# Patient Record
Sex: Female | Born: 1970 | Race: White | Hispanic: No | Smoking: Current every day smoker
Health system: Southern US, Community
[De-identification: ages and names within clinical notes are randomized; demographics above are authoritative.]

## PROBLEM LIST (undated history)

## (undated) DIAGNOSIS — J449 Chronic obstructive pulmonary disease, unspecified: Secondary | ICD-10-CM

## (undated) DIAGNOSIS — F1020 Alcohol dependence, uncomplicated: Secondary | ICD-10-CM

## (undated) DIAGNOSIS — O00109 Unspecified tubal pregnancy without intrauterine pregnancy: Secondary | ICD-10-CM

## (undated) DIAGNOSIS — E559 Vitamin D deficiency, unspecified: Secondary | ICD-10-CM

## (undated) DIAGNOSIS — K7010 Alcoholic hepatitis without ascites: Secondary | ICD-10-CM

## (undated) DIAGNOSIS — Z720 Tobacco use: Secondary | ICD-10-CM

## (undated) DIAGNOSIS — K449 Diaphragmatic hernia without obstruction or gangrene: Secondary | ICD-10-CM

## (undated) DIAGNOSIS — G8929 Other chronic pain: Secondary | ICD-10-CM

## (undated) DIAGNOSIS — K760 Fatty (change of) liver, not elsewhere classified: Secondary | ICD-10-CM

## (undated) DIAGNOSIS — K219 Gastro-esophageal reflux disease without esophagitis: Secondary | ICD-10-CM

## (undated) DIAGNOSIS — J439 Emphysema, unspecified: Secondary | ICD-10-CM

## (undated) DIAGNOSIS — F1010 Alcohol abuse, uncomplicated: Secondary | ICD-10-CM

## (undated) DIAGNOSIS — J45909 Unspecified asthma, uncomplicated: Secondary | ICD-10-CM

## (undated) DIAGNOSIS — R748 Abnormal levels of other serum enzymes: Secondary | ICD-10-CM

## (undated) DIAGNOSIS — D7589 Other specified diseases of blood and blood-forming organs: Secondary | ICD-10-CM

## (undated) DIAGNOSIS — F4323 Adjustment disorder with mixed anxiety and depressed mood: Secondary | ICD-10-CM

## (undated) DIAGNOSIS — F1021 Alcohol dependence, in remission: Secondary | ICD-10-CM

## (undated) DIAGNOSIS — M549 Dorsalgia, unspecified: Secondary | ICD-10-CM

## (undated) DIAGNOSIS — E279 Disorder of adrenal gland, unspecified: Secondary | ICD-10-CM

## (undated) DIAGNOSIS — F1910 Other psychoactive substance abuse, uncomplicated: Secondary | ICD-10-CM

## (undated) DIAGNOSIS — R011 Cardiac murmur, unspecified: Secondary | ICD-10-CM

## (undated) DIAGNOSIS — D649 Anemia, unspecified: Secondary | ICD-10-CM

## (undated) DIAGNOSIS — G621 Alcoholic polyneuropathy: Secondary | ICD-10-CM

## (undated) DIAGNOSIS — I10 Essential (primary) hypertension: Secondary | ICD-10-CM

## (undated) DIAGNOSIS — M1990 Unspecified osteoarthritis, unspecified site: Secondary | ICD-10-CM

## (undated) DIAGNOSIS — M4804 Spinal stenosis, thoracic region: Secondary | ICD-10-CM

## (undated) DIAGNOSIS — M5124 Other intervertebral disc displacement, thoracic region: Secondary | ICD-10-CM

## (undated) HISTORY — DX: Anemia, unspecified: D64.9

## (undated) HISTORY — DX: Alcohol abuse, uncomplicated: F10.10

## (undated) HISTORY — DX: Other psychoactive substance abuse, uncomplicated: F19.10

## (undated) HISTORY — DX: Alcohol dependence, in remission: F10.21

## (undated) HISTORY — DX: Essential (primary) hypertension: I10

## (undated) HISTORY — DX: Vitamin D deficiency, unspecified: E55.9

## (undated) HISTORY — DX: Cardiac murmur, unspecified: R01.1

## (undated) HISTORY — DX: Alcoholic polyneuropathy: G62.1

## (undated) HISTORY — DX: Diaphragmatic hernia without obstruction or gangrene: K44.9

## (undated) HISTORY — DX: Gastro-esophageal reflux disease without esophagitis: K21.9

## (undated) HISTORY — PX: LAPAROSCOPIC OOPHERECTOMY: SHX6507

## (undated) HISTORY — PX: FINGER SURGERY: SHX640

## (undated) HISTORY — DX: Alcohol dependence, uncomplicated: F10.20

## (undated) HISTORY — DX: Emphysema, unspecified: J43.9

## (undated) HISTORY — DX: Other specified diseases of blood and blood-forming organs: D75.89

## (undated) HISTORY — DX: Chronic obstructive pulmonary disease, unspecified: J44.9

## (undated) HISTORY — DX: Abnormal levels of other serum enzymes: R74.8

## (undated) HISTORY — DX: Other intervertebral disc displacement, thoracic region: M51.24

## (undated) HISTORY — DX: Spinal stenosis, thoracic region: M48.04

## (undated) HISTORY — DX: Alcoholic hepatitis without ascites: K70.10

## (undated) HISTORY — DX: Unspecified tubal pregnancy without intrauterine pregnancy: O00.109

## (undated) HISTORY — DX: Tobacco use: Z72.0

## (undated) HISTORY — DX: Unspecified asthma, uncomplicated: J45.909

## (undated) HISTORY — DX: Adjustment disorder with mixed anxiety and depressed mood: F43.23

## (undated) HISTORY — DX: Disorder of adrenal gland, unspecified: E27.9

---

## 1990-04-30 HISTORY — PX: TUBAL LIGATION: SHX77

## 2004-06-13 ENCOUNTER — Ambulatory Visit: Payer: Self-pay | Admitting: General Practice

## 2010-05-31 ENCOUNTER — Ambulatory Visit: Payer: Self-pay | Admitting: Oncology

## 2010-06-19 ENCOUNTER — Inpatient Hospital Stay: Payer: Self-pay | Admitting: Internal Medicine

## 2010-06-26 ENCOUNTER — Ambulatory Visit: Payer: Self-pay | Admitting: Oncology

## 2010-06-29 ENCOUNTER — Ambulatory Visit: Payer: Self-pay | Admitting: Oncology

## 2010-07-30 ENCOUNTER — Ambulatory Visit: Payer: Self-pay | Admitting: Oncology

## 2010-09-14 ENCOUNTER — Ambulatory Visit: Payer: Self-pay

## 2010-09-26 ENCOUNTER — Ambulatory Visit: Payer: Self-pay

## 2010-10-30 ENCOUNTER — Ambulatory Visit: Payer: Self-pay | Admitting: Oncology

## 2010-11-29 ENCOUNTER — Ambulatory Visit: Payer: Self-pay | Admitting: Oncology

## 2010-12-30 ENCOUNTER — Ambulatory Visit: Payer: Self-pay | Admitting: Oncology

## 2011-01-29 ENCOUNTER — Ambulatory Visit: Payer: Self-pay | Admitting: Oncology

## 2011-03-19 ENCOUNTER — Ambulatory Visit: Payer: Self-pay

## 2011-04-03 ENCOUNTER — Ambulatory Visit: Payer: Self-pay | Admitting: Oncology

## 2011-05-01 ENCOUNTER — Ambulatory Visit: Payer: Self-pay | Admitting: Oncology

## 2011-06-01 ENCOUNTER — Ambulatory Visit: Payer: Self-pay | Admitting: Oncology

## 2011-06-26 LAB — CBC CANCER CENTER
Basophil: 1 %
Eosinophil: 1 %
HCT: 42 % (ref 35.0–47.0)
HGB: 14.3 g/dL (ref 12.0–16.0)
Lymphocytes: 32 %
MCHC: 34.1 g/dL (ref 32.0–36.0)
MCV: 111 fL — ABNORMAL HIGH (ref 80–100)
Monocytes: 3 %
Segmented Neutrophils: 63 %
WBC: 8.5 x10 3/mm (ref 3.6–11.0)

## 2011-06-29 ENCOUNTER — Ambulatory Visit: Payer: Self-pay | Admitting: Oncology

## 2011-07-26 ENCOUNTER — Other Ambulatory Visit: Payer: Self-pay | Admitting: Diagnostic Neuroimaging

## 2011-07-26 DIAGNOSIS — R9089 Other abnormal findings on diagnostic imaging of central nervous system: Secondary | ICD-10-CM

## 2011-07-26 DIAGNOSIS — H539 Unspecified visual disturbance: Secondary | ICD-10-CM

## 2011-07-30 ENCOUNTER — Ambulatory Visit: Payer: Self-pay | Admitting: Oncology

## 2011-07-31 ENCOUNTER — Other Ambulatory Visit: Payer: Self-pay

## 2011-08-01 ENCOUNTER — Other Ambulatory Visit: Payer: Self-pay

## 2011-08-01 ENCOUNTER — Inpatient Hospital Stay: Admission: RE | Admit: 2011-08-01 | Payer: Self-pay | Source: Ambulatory Visit

## 2011-08-01 NOTE — Discharge Instructions (Signed)

## 2011-08-08 ENCOUNTER — Ambulatory Visit
Admission: RE | Admit: 2011-08-08 | Discharge: 2011-08-08 | Disposition: A | Discharge status: Home / Self Care | Payer: PRIVATE HEALTH INSURANCE | Source: Ambulatory Visit | Attending: Diagnostic Neuroimaging | Admitting: Diagnostic Neuroimaging

## 2011-08-08 ENCOUNTER — Other Ambulatory Visit (HOSPITAL_COMMUNITY)
Admission: RE | Admit: 2011-08-08 | Discharge: 2011-08-08 | Disposition: A | Discharge status: Home / Self Care | Payer: PRIVATE HEALTH INSURANCE | Source: Ambulatory Visit | Attending: Diagnostic Radiology | Admitting: Diagnostic Radiology

## 2011-08-08 VITALS — BP 129/78 | HR 66

## 2011-08-08 DIAGNOSIS — H5310 Unspecified subjective visual disturbances: Secondary | ICD-10-CM | POA: Insufficient documentation

## 2011-08-08 DIAGNOSIS — R9089 Other abnormal findings on diagnostic imaging of central nervous system: Secondary | ICD-10-CM

## 2011-08-08 DIAGNOSIS — R9409 Abnormal results of other function studies of central nervous system: Secondary | ICD-10-CM | POA: Insufficient documentation

## 2011-08-08 DIAGNOSIS — R839 Unspecified abnormal finding in cerebrospinal fluid: Secondary | ICD-10-CM

## 2011-08-08 DIAGNOSIS — R51 Headache: Secondary | ICD-10-CM | POA: Insufficient documentation

## 2011-08-08 DIAGNOSIS — H539 Unspecified visual disturbance: Secondary | ICD-10-CM

## 2011-08-08 NOTE — Progress Notes (Signed)
Resting quietly without complaint.  "I'm doing fine."  jkl

## 2011-08-08 NOTE — Progress Notes (Signed)
One purple top and four tiger top tubes blood collected from right [facility] space for labs.  jkl

## 2011-08-08 NOTE — Discharge Instructions (Signed)
Lumbar Puncture Discharge Instructions  1. Go home and rest quietly for the next 24 hours.  It is important to lie flat for the next 24 hours.  Get up only to go to the restroom.  You may lie in the bed or on a couch on your back, your stomach, your left side or your right side.  You may have one pillow under your head.  You may have pillows between your knees while you are on your side or under your knees while you are on your back.  2. DO NOT drive today.  Recline the seat as far back as it will go, while still wearing your seat belt, on the way home.  3. You may get up to go to the bathroom as needed.  You may sit up for 10 minutes to eat.  You may resume your normal diet and medications unless otherwise indicated.  Drink lots of extra fluids today and tomorrow.  4. The incidence of headache, nausea, or vomiting is about 5% (one in 20 patients).  If you develop a headache, lie flat and drink plenty of fluids until the headache goes away.  Caffeinated beverages may be helpful.  If you develop severe nausea and vomiting or a headache that does not go away with flat bed rest, call [phone].  5. You may resume normal activities after your 24 hours of bed rest is over; however, do not exert yourself strongly or do any heavy lifting tomorrow.  6. Call your physician for a follow-up appointment.   7. If you have any questions or if complications develop after you arrive home, please call [phone].  Discharge instructions have been explained to the patient.  The patient, or the person responsible for the patient, fully understands these instructions. y

## 2011-08-08 NOTE — Progress Notes (Signed)
Pt had blood drawn for labs and to go with spinal fluid. Dd unsuccessful X1 left antecube. J.lohr rn was able to draw blood from right antecube, site unremarkable. Pt tolerated procedure well.

## 2011-08-29 ENCOUNTER — Ambulatory Visit: Payer: Self-pay | Admitting: Oncology

## 2011-09-04 ENCOUNTER — Ambulatory Visit: Payer: Self-pay

## 2011-09-18 ENCOUNTER — Ambulatory Visit: Payer: Self-pay | Admitting: Oncology

## 2011-09-18 LAB — CBC CANCER CENTER
Basophil #: 0.2 x10 3/mm — ABNORMAL HIGH (ref 0.0–0.1)
Eosinophil #: 0.1 x10 3/mm (ref 0.0–0.7)
Eosinophil %: 1.7 %
HGB: 13.4 g/dL (ref 12.0–16.0)
Lymphocyte #: 1.9 x10 3/mm (ref 1.0–3.6)
Lymphocyte %: 25.4 %
MCH: 38.2 pg — ABNORMAL HIGH (ref 26.0–34.0)
MCHC: 34.1 g/dL (ref 32.0–36.0)
MCV: 112 fL — ABNORMAL HIGH (ref 80–100)
Monocyte #: 0.6 x10 3/mm (ref 0.2–0.9)
Neutrophil #: 4.7 x10 3/mm (ref 1.4–6.5)
Neutrophil %: 62.8 %
RBC: 3.5 10*6/uL — ABNORMAL LOW (ref 3.80–5.20)
RDW: 15.4 % — ABNORMAL HIGH (ref 11.5–14.5)

## 2011-09-29 ENCOUNTER — Ambulatory Visit: Payer: Self-pay | Admitting: Oncology

## 2012-01-16 ENCOUNTER — Ambulatory Visit: Payer: Self-pay

## 2012-01-23 LAB — BASIC METABOLIC PANEL
Calcium, Total: 7.6 mg/dL — ABNORMAL LOW (ref 8.5–10.1)
Co2: 23 mmol/L (ref 21–32)
EGFR (African American): >60
EGFR (Non-African Amer.): >60
Glucose: 99 mg/dL (ref 65–99)
Osmolality: 261 (ref 275–301)
Potassium: 2 mmol/L — CL (ref 3.5–5.1)
Sodium: 131 mmol/L — ABNORMAL LOW (ref 136–145)

## 2012-01-23 LAB — HEPATIC FUNCTION PANEL A (ARMC)
Alkaline Phosphatase: 224 U/L — ABNORMAL HIGH (ref 50–136)
Bilirubin, Direct: 0.1 mg/dL (ref 0.00–0.20)

## 2012-01-23 LAB — CBC
HCT: 19.8 % — ABNORMAL LOW (ref 35.0–47.0)
HGB: 7 g/dL — ABNORMAL LOW (ref 12.0–16.0)
MCH: 39.8 pg — ABNORMAL HIGH (ref 26.0–34.0)
RBC: 1.76 10*6/uL — ABNORMAL LOW (ref 3.80–5.20)
WBC: 12.2 10*3/uL — ABNORMAL HIGH (ref 3.6–11.0)

## 2012-01-23 LAB — MAGNESIUM: Magnesium: 1.2 mg/dL — ABNORMAL LOW

## 2012-01-23 LAB — PHOSPHORUS: Phosphorus: 2.4 mg/dL — ABNORMAL LOW (ref 2.5–4.9)

## 2012-01-24 LAB — CBC WITH DIFFERENTIAL/PLATELET
Basophil #: 0.1 10*3/uL (ref 0.0–0.1)
Basophil %: 0.9 %
Eosinophil #: 0.1 10*3/uL (ref 0.0–0.7)
HCT: 21.5 % — ABNORMAL LOW (ref 35.0–47.0)
Lymphocyte #: 1.1 10*3/uL (ref 1.0–3.6)
MCHC: 35.2 g/dL (ref 32.0–36.0)
MCV: 102 fL — ABNORMAL HIGH (ref 80–100)
Monocyte %: 2 %
Neutrophil #: 5.2 10*3/uL (ref 1.4–6.5)
RDW: 26.9 % — ABNORMAL HIGH (ref 11.5–14.5)
WBC: 6.7 10*3/uL (ref 3.6–11.0)

## 2012-01-24 LAB — BASIC METABOLIC PANEL
Anion Gap: 10 (ref 7–16)
Calcium, Total: 7.2 mg/dL — ABNORMAL LOW (ref 8.5–10.1)
Co2: 30 mmol/L (ref 21–32)
Creatinine: 0.79 mg/dL (ref 0.60–1.30)
EGFR (African American): >60
Osmolality: 272 (ref 275–301)
Potassium: 3.2 mmol/L — ABNORMAL LOW (ref 3.5–5.1)

## 2012-01-24 LAB — MAGNESIUM: Magnesium: 2 mg/dL

## 2012-01-25 ENCOUNTER — Inpatient Hospital Stay: Payer: Self-pay | Admitting: Internal Medicine

## 2012-01-25 LAB — CBC WITH DIFFERENTIAL/PLATELET
Basophil #: 0 10*3/uL (ref 0.0–0.1)
Eosinophil #: 0.1 10*3/uL (ref 0.0–0.7)
Eosinophil %: 2.2 %
Lymphocyte #: 1.8 10*3/uL (ref 1.0–3.6)
MCH: 35.6 pg — ABNORMAL HIGH (ref 26.0–34.0)
MCHC: 34.5 g/dL (ref 32.0–36.0)
MCV: 103 fL — ABNORMAL HIGH (ref 80–100)
Monocyte #: 0.1 x10 3/mm — ABNORMAL LOW (ref 0.2–0.9)
Neutrophil %: 63.2 %
Platelet: 207 10*3/uL (ref 150–440)
RBC: 1.88 10*6/uL — ABNORMAL LOW (ref 3.80–5.20)
RDW: 28.2 % — ABNORMAL HIGH (ref 11.5–14.5)

## 2012-01-25 LAB — BASIC METABOLIC PANEL
Anion Gap: 9 (ref 7–16)
BUN: 5 mg/dL — ABNORMAL LOW (ref 7–18)
Creatinine: 0.76 mg/dL (ref 0.60–1.30)
EGFR (African American): >60
EGFR (Non-African Amer.): >60
Glucose: 95 mg/dL (ref 65–99)
Osmolality: 282 (ref 275–301)
Potassium: 3.2 mmol/L — ABNORMAL LOW (ref 3.5–5.1)
Sodium: 143 mmol/L (ref 136–145)

## 2012-01-25 LAB — FOLATE: Folic Acid: 2.4 ng/mL — ABNORMAL LOW (ref 3.1–100.0)

## 2012-01-25 LAB — IRON AND TIBC
Iron Bind.Cap.(Total): 210 ug/dL — ABNORMAL LOW (ref 250–450)
Iron: 183 ug/dL — ABNORMAL HIGH (ref 50–170)
Unbound Iron-Bind.Cap.: 27 ug/dL

## 2012-01-25 LAB — OCCULT BLOOD X 1 CARD TO LAB, STOOL: Occult Blood, Feces: NEGATIVE

## 2012-01-26 LAB — BASIC METABOLIC PANEL
Anion Gap: 7 (ref 7–16)
BUN: 6 mg/dL — ABNORMAL LOW (ref 7–18)
Calcium, Total: 7.7 mg/dL — ABNORMAL LOW (ref 8.5–10.1)
Co2: 26 mmol/L (ref 21–32)
EGFR (African American): >60
EGFR (Non-African Amer.): >60
Glucose: 82 mg/dL (ref 65–99)
Osmolality: 276 (ref 275–301)

## 2012-01-26 LAB — MAGNESIUM: Magnesium: 1.5 mg/dL — ABNORMAL LOW

## 2012-01-26 LAB — HEMOGLOBIN: HGB: 8.1 g/dL — ABNORMAL LOW (ref 12.0–16.0)

## 2012-01-26 LAB — HEMATOCRIT: HCT: 23.9 % — ABNORMAL LOW (ref 35.0–47.0)

## 2012-02-05 ENCOUNTER — Ambulatory Visit: Payer: Self-pay | Admitting: Oncology

## 2012-02-05 LAB — COMPREHENSIVE METABOLIC PANEL
Alkaline Phosphatase: 239 U/L — ABNORMAL HIGH (ref 50–136)
BUN: 10 mg/dL (ref 7–18)
Bilirubin,Total: 0.2 mg/dL (ref 0.2–1.0)
Creatinine: 1.05 mg/dL (ref 0.60–1.30)
EGFR (Non-African Amer.): >60
SGPT (ALT): 43 U/L (ref 12–78)
Total Protein: 6.5 g/dL (ref 6.4–8.2)

## 2012-02-05 LAB — MAGNESIUM: Magnesium: 1.7 mg/dL — ABNORMAL LOW

## 2012-02-05 LAB — CBC CANCER CENTER
Basophil #: 0.1 x10 3/mm (ref 0.0–0.1)
Eosinophil %: 2.5 %
Lymphocyte %: 22.7 %
Monocyte #: 0.9 x10 3/mm (ref 0.2–0.9)
Monocyte %: 6.8 %
Neutrophil %: 67.2 %
Platelet: 294 x10 3/mm (ref 150–440)
RBC: 3.24 10*6/uL — ABNORMAL LOW (ref 3.80–5.20)
WBC: 13.3 x10 3/mm — ABNORMAL HIGH (ref 3.6–11.0)

## 2012-02-29 ENCOUNTER — Ambulatory Visit: Payer: Self-pay | Admitting: Oncology

## 2012-04-30 DIAGNOSIS — R748 Abnormal levels of other serum enzymes: Secondary | ICD-10-CM

## 2012-04-30 HISTORY — DX: Abnormal levels of other serum enzymes: R74.8

## 2012-05-08 ENCOUNTER — Ambulatory Visit: Payer: Self-pay | Admitting: Oncology

## 2012-05-08 LAB — IRON AND TIBC
Iron Bind.Cap.(Total): 241 ug/dL — ABNORMAL LOW (ref 250–450)
Iron Saturation: 95 %
Iron: 228 ug/dL — ABNORMAL HIGH (ref 50–170)
Unbound Iron-Bind.Cap.: 13 ug/dL

## 2012-05-08 LAB — CBC CANCER CENTER
Basophil %: 0.7 %
Eosinophil #: 0.2 x10 3/mm (ref 0.0–0.7)
Eosinophil %: 1.5 %
MCH: 34.7 pg — ABNORMAL HIGH (ref 26.0–34.0)
MCHC: 34.6 g/dL (ref 32.0–36.0)
Monocyte #: 0.6 x10 3/mm (ref 0.2–0.9)
Monocyte %: 5.9 %
Neutrophil #: 7.1 x10 3/mm — ABNORMAL HIGH (ref 1.4–6.5)
Neutrophil %: 64.4 %
RBC: 4.43 10*6/uL (ref 3.80–5.20)
RDW: 14.2 % (ref 11.5–14.5)
WBC: 11 x10 3/mm (ref 3.6–11.0)

## 2012-05-08 LAB — FOLATE: Folic Acid: >100 ng/mL — ABNORMAL HIGH (ref 3.1–100.0)

## 2012-05-08 LAB — FERRITIN: Ferritin (ARMC): 527 ng/mL — ABNORMAL HIGH (ref 8–388)

## 2012-05-31 ENCOUNTER — Ambulatory Visit: Payer: Self-pay | Admitting: Oncology

## 2012-08-04 DIAGNOSIS — Z0289 Encounter for other administrative examinations: Secondary | ICD-10-CM

## 2012-09-04 ENCOUNTER — Ambulatory Visit: Payer: Self-pay | Admitting: Oncology

## 2012-09-05 LAB — CBC CANCER CENTER
Eosinophil %: 1.3 %
HGB: 14.9 g/dL (ref 12.0–16.0)
Lymphocyte #: 2.4 x10 3/mm (ref 1.0–3.6)
Lymphocyte %: 25.8 %
MCV: 98 fL (ref 80–100)
Monocyte %: 5.4 %
Neutrophil #: 6.3 x10 3/mm (ref 1.4–6.5)
Neutrophil %: 66.9 %
Platelet: 194 x10 3/mm (ref 150–440)
WBC: 9.3 x10 3/mm (ref 3.6–11.0)

## 2012-09-05 LAB — FOLATE: Folic Acid: 78.5 ng/mL (ref 3.1–100.0)

## 2012-09-05 LAB — IRON AND TIBC
Iron Saturation: 50 %
Iron: 171 ug/dL — ABNORMAL HIGH (ref 50–170)
Unbound Iron-Bind.Cap.: 169 ug/dL

## 2012-09-28 ENCOUNTER — Ambulatory Visit: Payer: Self-pay | Admitting: Oncology

## 2013-01-22 ENCOUNTER — Ambulatory Visit: Payer: Self-pay | Admitting: Oncology

## 2013-01-22 LAB — CBC CANCER CENTER
Basophil #: 0.1 x10 3/mm (ref 0.0–0.1)
Eosinophil #: 0.1 x10 3/mm (ref 0.0–0.7)
HCT: 44.7 % (ref 35.0–47.0)
HGB: 15.3 g/dL (ref 12.0–16.0)
Lymphocyte #: 2.2 x10 3/mm (ref 1.0–3.6)
Lymphocyte %: 18.8 %
MCH: 35.6 pg — ABNORMAL HIGH (ref 26.0–34.0)
MCHC: 34.3 g/dL (ref 32.0–36.0)
Monocyte %: 6.3 %
Neutrophil %: 73.4 %
Platelet: 212 x10 3/mm (ref 150–440)
RDW: 15.2 % — ABNORMAL HIGH (ref 11.5–14.5)

## 2013-01-22 LAB — IRON AND TIBC: Iron: 250 ug/dL — ABNORMAL HIGH (ref 50–170)

## 2013-01-28 ENCOUNTER — Ambulatory Visit: Payer: Self-pay | Admitting: Oncology

## 2013-04-01 ENCOUNTER — Ambulatory Visit: Payer: Self-pay | Admitting: Family Medicine

## 2013-04-08 ENCOUNTER — Encounter: Payer: Self-pay | Admitting: Diagnostic Neuroimaging

## 2013-04-08 ENCOUNTER — Ambulatory Visit (INDEPENDENT_AMBULATORY_CARE_PROVIDER_SITE_OTHER): Payer: BC Managed Care – PPO | Admitting: Diagnostic Neuroimaging

## 2013-04-08 ENCOUNTER — Encounter (INDEPENDENT_AMBULATORY_CARE_PROVIDER_SITE_OTHER): Payer: Self-pay

## 2013-04-08 VITALS — BP 139/92 | HR 83 | Temp 98.3°F | Ht 65.5 in | Wt 217.5 lb

## 2013-04-08 DIAGNOSIS — F17200 Nicotine dependence, unspecified, uncomplicated: Secondary | ICD-10-CM

## 2013-04-08 DIAGNOSIS — Z720 Tobacco use: Secondary | ICD-10-CM | POA: Insufficient documentation

## 2013-04-08 DIAGNOSIS — M531 Cervicobrachial syndrome: Secondary | ICD-10-CM

## 2013-04-08 DIAGNOSIS — I6789 Other cerebrovascular disease: Secondary | ICD-10-CM

## 2013-04-08 DIAGNOSIS — G621 Alcoholic polyneuropathy: Secondary | ICD-10-CM

## 2013-04-08 DIAGNOSIS — R269 Unspecified abnormalities of gait and mobility: Secondary | ICD-10-CM

## 2013-04-08 DIAGNOSIS — M5481 Occipital neuralgia: Secondary | ICD-10-CM

## 2013-04-08 DIAGNOSIS — I6782 Cerebral ischemia: Secondary | ICD-10-CM

## 2013-04-08 DIAGNOSIS — F1010 Alcohol abuse, uncomplicated: Secondary | ICD-10-CM

## 2013-04-08 DIAGNOSIS — M5412 Radiculopathy, cervical region: Secondary | ICD-10-CM | POA: Insufficient documentation

## 2013-04-08 DIAGNOSIS — F1011 Alcohol abuse, in remission: Secondary | ICD-10-CM | POA: Insufficient documentation

## 2013-04-08 MED ORDER — AMITRIPTYLINE HCL 25 MG PO TABS: 25.0000 mg | ORAL_TABLET | Freq: Every day | ORAL | Status: DC

## 2013-04-08 NOTE — Progress Notes (Signed)
[facility]  PATIENT: Beth [name] DOB: Oct 24, 1970  REFERRING CLINICIAN: Lada HISTORY FROM: patient, sister, brother REASON FOR VISIT: follow up / new problem   HISTORICAL  CHIEF COMPLAINT:  Chief Complaint  Patient presents with  . Headache    HISTORY OF PRESENT ILLNESS:   UPDATE 04/08/13: Since last visit patient has developed new-onset sharp, lightning/electrical sensations in the back of her head down her spine. This happens randomly without triggering factor. Episode lasted for one second at a time. She has had 7 episodes in the last 2 weeks. Patient MRI of the brain which showed slight progression of nonspecific white matter lesions, likely chronic small vessel ischemic disease. Patient continues on gabapentin 2400 mg daily for neuropathy symptoms and feet. Patient continues to smoke one pack cigarettes per day. She's cut down to drinking alcohol 3-4 times per week. Her mobility is still poor and she uses a rolling walker. She's not very active and her weight has gradually increased over the past year (155lbs on 08/31/11 --> 171lbs on 06/03/12 --> 217lbs 04/08/13).  UPDATE 06/03/12: Since last visit, her husband passed away due to alcholoic cirrhosis. She was admitted to the hospital the same day he was buried, for hypokalemia and hypomagnesemia. She had been binging on ETOH and not eating. Now has cut down, but not quit ETOH. Still with intermittent pain in her feet. Still with anemia.  UPDATE 12/17/11:  She is doing worse with her symptoms.  She is having more frequent falls.  She is having difficulty with writing. She is having numbness from her upper lip down to her feet, this has worsened in the last 3 weeks.  Denies any headaches.  She complains of bilateral leg pain like "pins and needles".  She stopped drinking alcohol 4 weeks ago; reports drinking 8-9 glasses of Whiskey daily.  Reports her thyroid and diabetes labs were normal; I do not have these labs today.   She denies back pain.  She is here with her sister   UPDATE 08/31/11: Doing about the same. Now with numbness and tingling in the hand and feet.  Headaches better.  Tests reviewed.  UPDATE 07/25/11: Doing the same. Brought MRI disc and report for me to review today. Vision, numbness are same. Headaches are better.  PRIOR HPI (04/04/11): 42 year old right-handed female with history of hypertension, unexplained anemia, here for evaluation of progressive vision changes, headaches and abnormal MRI brain.  Symptoms started in October 2012. She developed gradual blurred vision in both eyes, optometrist who could not correct the problem with lenses, and then she had MRI which showed nonspecific white matter changes.   In last 1 week, she has developed dull HA.   In August 2012, she was hospitalized due to malaise and found to have severe anemia requiring blood transfusion. Extensive testing including bone marrow biopsy, could not identify the cause of her anemia.  REVIEW OF SYSTEMS: Full 14 system review of systems performed and notable only for fatigue anemia murmurs blurred vision shortness of breath increased there is peripancreatic muscles sleep decreased energy dizziness restless legs insomnia headache numbness weakness.  ALLERGIES: Allergies  Allergen Reactions  . Erythromycin Itching and Rash    HOME MEDICATIONS: No outpatient prescriptions prior to visit.   No facility-administered medications prior to visit.    PAST MEDICAL HISTORY: History reviewed. No pertinent past medical history.  PAST SURGICAL HISTORY: Past Surgical History  Procedure Laterality Date  . Tubal ligation  1992  . Finger surgery  FAMILY HISTORY: Family History  Problem Relation Age of Onset  . Cancer Mother   . Kidney failure Father   . Diabetes Father     SOCIAL HISTORY:  History   Social History  . Marital Status: Widowed    Spouse Name: N/A    Number of Children: 0  . Years of Education:  12th   Occupational History  .  Other    n/a   Social History Main Topics  . Smoking status: Never Smoker   . Smokeless tobacco: Never Used  . Alcohol Use: Yes     Comment: 3-4 drinks weekly  . Drug Use: No  . Sexual Activity: Not on file   Other Topics Concern  . Not on file   Social History Narrative   Patient lives at home with her family.   Caffeine Use: daily     PHYSICAL EXAM  Filed Vitals:   04/08/13 1032  BP: 139/92  Pulse: 83  Temp: 98.3 F (36.8 C)  TempSrc: Oral  Height: 5' 5.5" (1.664 m)  Weight: 217 lb 8 oz (98.657 kg)    Not recorded    Body mass index is 35.63 kg/(m^2).  GENERAL EXAM: Patient is in no distress; well developed, nourished and groomed; neck is supple  CARDIOVASCULAR: Regular rate and rhythm, no murmurs, no carotid bruits  NEUROLOGIC: MENTAL STATUS: awake, alert, oriented to person, place and time, recent and remote memory intact, normal attention and concentration, language fluent, comprehension intact, naming intact, fund of knowledge appropriate CRANIAL NERVE: no papilledema on fundoscopic exam, pupils equal and reactive to light, visual fields full to confrontation, extraocular muscles intact, no nystagmus, facial sensation and strength symmetric, hearing intact, palate elevates symmetrically, uvula midline, shoulder shrug symmetric, tongue midline. MOTOR: normal bulk and tone, full strength in the BUE, BLE SENSORY: normal and symmetric to light touch, pinprick, temperature, vibration and proprioception COORDINATION: finger-nose-finger, fine finger movements, heel-shin normal REFLEXES: deep tendon reflexes present and symmetric GAIT/STATION: narrow based gait; able to walk on toes, heels and tandem; romberg is negative    DIAGNOSTIC DATA (LABS, IMAGING, TESTING) - I reviewed patient records, labs, notes, testing and imaging myself where available.  No results found for this basename: WBC, HGB, HCT, MCV, PLT   No results  found for this basename: na, k, cl, co2, glucose, bun, creatinine, calcium, prot, albumin, ast, alt, alkphos, bilitot, gfrnonaa, gfraa   No results found for this basename: CHOL, HDL, LDLCALC, LDLDIRECT, TRIG, CHOLHDL   No results found for this basename: HGBA1C   No results found for this basename: VITAMINB12   No results found for this basename: TSH    08/08/11 Labs - B12 1192, ACE 3, ANA neg, ANCA neg, HIV nr, Vit A 18 (L), B1 98 (nl)  08/08/11 CSF - WBC, RBC, OCB neg (0), IgG index 0.52, , protein 33, glucose 63, ACE 4, culture neg, cytology neg  07/31/12 Visual evoked potentials - prolonged P100 latencies bilaterally, indicates dysfunction of the visual pathways that cannot be further localized.   08/01/11 MRI brain  1. Scattered periventricular, subcortical and juxtacortical foci of gliosis. These findings are non-specific and considerations include autoimmune, inflammatory, post-infectious, microvascular ischemic or migraine associated etiologies. 2. No abnormal enhancing lesions. 3. No change from prior MRI on 03/19/11.  08/01/11 MRI cervical spine 1. Disc bulging from C4-5 down to C7-T1. 2. Subtle punctate T2 hyperintensity at C6-7 on sagittal T2 views, but not clearly seen on  axial T2 views. Considerations include autoimmune, inflammatory,  post-infectious etiologies or artifact.  08/01/11 MRI thoracic spine  1. Disc bulging at T9-10. Hemangioma at T9. Schmorl's nodes throughout from T7-8 down to L1-2. 2. No intrinsic or enhancing spinal cord lesions.   09/17/11 EMG/NCS 1. Bilateral lumbosacral radiculopathies. 2. Mild left ulnar sensory neuropathy at wrist.  04/01/13 MRI brain Slight progression of nonspecific punctate and patchy white matter type changes. Considerations include white matter type changes secondary to; small vessel disease, vasculitis, demyelinating process, inflammatory process, remote infection/trauma or from migraine headaches.   ASSESSMENT AND PLAN  42 y.o.  female with hypertension, smoking, alcohol abuse, severe unexplained anemia (? 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 LP, labs for MS and mimics reviewed and are negative.    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    Meds ordered this encounter  Medications  . folic acid (FOLVITE) 1 MG tablet    Sig: Take 1 mg by mouth daily. 2 tabs daily  . gabapentin (NEURONTIN) 800 MG tablet    Sig: Take 800 mg by mouth 3 (three) times daily.  Marland Kitchen omeprazole (PRILOSEC) 10 MG capsule    Sig: Take 10 mg by mouth daily.  Marland Kitchen amitriptyline (ELAVIL) 25 MG tablet    Sig: Take 1 tablet (25 mg total) by mouth at bedtime.    Dispense:  30 tablet    Refill:  12    Return in about 6 months (around 10/07/2013) for with Edison Nasuti, MD 04/08/2013, 11:25 AM Certified in Neurology, Neurophysiology and Neuroimaging  [facility] [address] [place], [place] [address] [phone]

## 2013-04-08 NOTE — Patient Instructions (Signed)
Try amitriptyline 25mg at bedtime.  

## 2013-05-11 ENCOUNTER — Encounter: Payer: Self-pay | Admitting: Diagnostic Neuroimaging

## 2013-05-18 ENCOUNTER — Ambulatory Visit: Payer: Self-pay | Admitting: Gastroenterology

## 2013-05-31 ENCOUNTER — Encounter: Payer: Self-pay | Admitting: Diagnostic Neuroimaging

## 2013-06-28 ENCOUNTER — Encounter: Payer: Self-pay | Admitting: Diagnostic Neuroimaging

## 2013-07-22 ENCOUNTER — Ambulatory Visit: Payer: Self-pay | Admitting: Oncology

## 2013-07-23 LAB — CBC CANCER CENTER
Basophil #: 0.1 x10 3/mm (ref 0.0–0.1)
Basophil %: 0.9 %
Eosinophil #: 0.1 x10 3/mm (ref 0.0–0.7)
Eosinophil %: 0.8 %
HCT: 41.7 % (ref 35.0–47.0)
HGB: 13.7 g/dL (ref 12.0–16.0)
LYMPHS PCT: 25.2 %
Lymphocyte #: 3.3 x10 3/mm (ref 1.0–3.6)
MCH: 33.6 pg (ref 26.0–34.0)
MCHC: 32.9 g/dL (ref 32.0–36.0)
MCV: 102 fL — AB (ref 80–100)
Monocyte #: 0.7 x10 3/mm (ref 0.2–0.9)
Monocyte %: 5.4 %
Neutrophil #: 8.8 x10 3/mm — ABNORMAL HIGH (ref 1.4–6.5)
Neutrophil %: 67.7 %
Platelet: 292 x10 3/mm (ref 150–440)
RBC: 4.09 10*6/uL (ref 3.80–5.20)
RDW: 18.1 % — ABNORMAL HIGH (ref 11.5–14.5)
WBC: 13 x10 3/mm — AB (ref 3.6–11.0)

## 2013-07-23 LAB — FOLATE: Folic Acid: >100 ng/mL — ABNORMAL HIGH (ref 3.1–100.0)

## 2013-07-23 LAB — IRON AND TIBC
IRON BIND. CAP.(TOTAL): 283 ug/dL (ref 250–450)
IRON SATURATION: 60 %
IRON: 169 ug/dL (ref 50–170)
UNBOUND IRON-BIND. CAP.: 114 ug/dL

## 2013-07-23 LAB — FERRITIN: Ferritin (ARMC): 180 ng/mL (ref 8–388)

## 2013-07-29 ENCOUNTER — Ambulatory Visit: Payer: Self-pay | Admitting: Oncology

## 2013-08-10 ENCOUNTER — Ambulatory Visit: Payer: Self-pay | Admitting: Family Medicine

## 2013-10-07 ENCOUNTER — Encounter (INDEPENDENT_AMBULATORY_CARE_PROVIDER_SITE_OTHER): Payer: Self-pay

## 2013-10-07 ENCOUNTER — Ambulatory Visit (INDEPENDENT_AMBULATORY_CARE_PROVIDER_SITE_OTHER): Payer: BC Managed Care – PPO | Admitting: Nurse Practitioner

## 2013-10-07 ENCOUNTER — Encounter: Payer: Self-pay | Admitting: Nurse Practitioner

## 2013-10-07 VITALS — BP 129/79 | HR 89 | Ht 65.5 in | Wt 212.0 lb

## 2013-10-07 DIAGNOSIS — G621 Alcoholic polyneuropathy: Secondary | ICD-10-CM

## 2013-10-07 DIAGNOSIS — R269 Unspecified abnormalities of gait and mobility: Secondary | ICD-10-CM

## 2013-10-07 DIAGNOSIS — I6789 Other cerebrovascular disease: Secondary | ICD-10-CM

## 2013-10-07 DIAGNOSIS — I6782 Cerebral ischemia: Secondary | ICD-10-CM

## 2013-10-07 DIAGNOSIS — Z720 Tobacco use: Secondary | ICD-10-CM

## 2013-10-07 DIAGNOSIS — F17200 Nicotine dependence, unspecified, uncomplicated: Secondary | ICD-10-CM

## 2013-10-07 MED ORDER — AMITRIPTYLINE HCL 25 MG PO TABS: 50.0000 mg | ORAL_TABLET | Freq: Every day | ORAL | Status: DC

## 2013-10-07 NOTE — Progress Notes (Signed)
PATIENT: Beth [name] DOB: [birth year]  REASON FOR VISIT: routine follow up for cervical neuralgia, peripheral neuropathy HISTORY FROM: patient  HISTORY OF PRESENT ILLNESS: UPDATE 10/07/13 (LL):  Since last visit, patient has continued with nightly Amitriptyline with resolution of occipital neuralgia.  She quit drinking alcohol in January and has since experienced a large increase in her neuropathic pain in her legs and feet. She continues on gabapentin 2400 mg daily for neuropathy symptoms.  She continues to smoke.  She attended 6 PT sessions after last visit but stopped when her regular therapist went on vacation and the fill-in therapist "nearly killed me." She is agreeable to starting back.  Gait is still poor, needs walker at all times.  She plans to have EGD soon for trouble swallowing.  UPDATE 04/08/13 (VP): Since last visit patient has developed new-onset sharp, lightning/electrical sensations in the back of her head down her spine. This happens randomly without triggering factor. Episode lasted for one second at a time. She has had 7 episodes in the last 2 weeks. Patient MRI of the brain which showed slight progression of nonspecific white matter lesions, likely chronic small vessel ischemic disease. Patient continues on gabapentin 2400 mg daily for neuropathy symptoms and feet. Patient continues to smoke one pack cigarettes per day. She's cut down to drinking alcohol 3-4 times per week. Her mobility is still poor and she uses a rolling walker. She's not very active and her weight has gradually increased over the past year (155lbs on 08/31/11 --> 171lbs on 06/03/12 --> 217lbs 04/08/13).  UPDATE 06/03/12: Since last visit, her husband passed away due to alcholoic cirrhosis. She was admitted to the hospital the same day he was buried, for hypokalemia and hypomagnesemia. She had been binging on ETOH and not eating. Now has cut down, but not quit ETOH. Still with intermittent pain in her feet. Still with  anemia.  UPDATE 12/17/11: She is doing worse with her symptoms. She is having more frequent falls. She is having difficulty with writing. She is having numbness from her upper lip down to her feet, this has worsened in the last 3 weeks. Denies any headaches. She complains of bilateral leg pain like "pins and needles". She stopped drinking alcohol 4 weeks ago; reports drinking 8-9 glasses of Whiskey daily. Reports her thyroid and diabetes labs were normal; I do not have these labs today. She denies back pain. She is here with her sister  UPDATE 08/31/11: Doing about the same. Now with numbness and tingling in the hand and feet. Headaches better. Tests reviewed.  UPDATE 07/25/11: Doing the same. Brought MRI disc and report for me to review today. Vision, numbness are same. Headaches are better.  PRIOR HPI (04/04/11): 43 year old right-handed female with history of hypertension, unexplained anemia, here for evaluation of progressive vision changes, headaches and abnormal MRI brain.  Symptoms started in October 2012. She developed gradual blurred vision in both eyes, optometrist who could not correct the problem with lenses, and then she had MRI which showed nonspecific white matter changes. In last 1 week, she has developed dull HA.  In August 2012, she was hospitalized due to malaise and found to have severe anemia requiring blood transfusion. Extensive testing including bone marrow biopsy, could not identify the cause of her anemia.   REVIEW OF SYSTEMS: Full 14 system review of systems performed and notable only for fatigue, appetite change, anemia, murmur, leg swelling, trouble swallowing, blurred vision, eye itching, shortness of breath, choking, joint pain, back  pain, aching muscles, muscle cramps, walking difficulty, neck pain, neck stiffness, dizziness, numbness, restless legs, insomnia, headache, speech difficulty, weakness, passing out, anxiety  ALLERGIES: Allergies  Allergen Reactions  . Erythromycin  Itching and Rash    HOME MEDICATIONS: Outpatient Prescriptions Prior to Visit  Medication Sig Dispense Refill  . folic acid (FOLVITE) 1 MG tablet Take 1 mg by mouth daily. 2 tabs daily      . gabapentin (NEURONTIN) 800 MG tablet Take 800 mg by mouth 3 (three) times daily.      Marland Kitchen omeprazole (PRILOSEC) 10 MG capsule Take 10 mg by mouth daily.      Marland Kitchen amitriptyline (ELAVIL) 25 MG tablet Take 1 tablet (25 mg total) by mouth at bedtime.  30 tablet  12   No facility-administered medications prior to visit.   PHYSICAL EXAM  Filed Vitals:   10/07/13 1125  BP: 129/79  Pulse: 89  Height: 5' 5.5" (1.664 m)  Weight: 212 lb (96.163 kg)   Body mass index is 34.73 kg/(m^2). No exam data present   Generalized: Well developed, in no acute distress. Strong smell is cigarette smoke. Head: normocephalic and atraumatic. Oropharynx benign  Neck: Supple, no carotid bruits  Cardiac: Regular rate rhythm, no murmur  Musculoskeletal: No deformity   Neurological examination  Mentation: Alert oriented to time, place, history taking. Follows all commands speech and language fluent Cranial nerve II-XII: Pupils were equal round reactive to light extraocular movements were full, visual field were full on confrontational test. Facial sensation and strength were normal. hearing was intact to finger rubbing bilaterally. Uvula tongue midline. head turning and shoulder shrug and were normal and symmetric.Tongue protrusion into cheek strength was normal. Motor: The motor testing reveals 5 over 5 strength of all 4 extremities. Good symmetric motor tone is noted throughout.  Sensory: Sensory testing is intact to soft touch on all 4 extremities. No evidence of extinction is noted.  Coordination: Cerebellar testing reveals good finger-nose-finger and heel-to-shin bilaterally.  Gait and station: narrow based gait; unable to walk on toes, heels and tandem; romberg is negative Reflexes: Deep tendon reflexes are symmetric  and normal bilaterally.    08/08/11 Labs - B12 1192, ACE 3, ANA neg, ANCA neg, HIV nr, Vit A 18 (L), B1 98 (nl)  08/08/11 CSF - WBC, RBC, OCB neg (0), IgG index 0.52, , protein 33, glucose 63, ACE 4, culture neg, cytology neg  07/31/12 Visual evoked potentials - prolonged P100 latencies bilaterally, indicates dysfunction of the visual pathways that cannot be further localized.  08/01/11 MRI brain  1. Scattered periventricular, subcortical and juxtacortical foci of gliosis. These findings are non-specific and considerations include autoimmune, inflammatory, post-infectious, microvascular ischemic or migraine associated etiologies.  2. No abnormal enhancing lesions.  3. No change from prior MRI on 03/19/11.  08/01/11 MRI cervical spine  1. Disc bulging from C4-5 down to C7-T1.  2. Subtle punctate T2 hyperintensity at C6-7 on sagittal T2 views, but not clearly seen on axial T2 views. Considerations include autoimmune, inflammatory, post-infectious etiologies or artifact.  08/01/11 MRI thoracic spine  1. Disc bulging at T9-10. Hemangioma at T9. Schmorl's nodes throughout from T7-8 down to L1-2.  2. No intrinsic or enhancing spinal cord lesions.  09/17/11 EMG/NCS  1. Bilateral lumbosacral radiculopathies.  2. Mild left ulnar sensory neuropathy at wrist.  04/01/13 MRI brain  Slight progression of nonspecific punctate and patchy white matter type changes. Considerations include white matter type changes secondary to; small vessel disease, vasculitis, demyelinating process, inflammatory  process, remote infection/trauma or from migraine headaches.   ASSESSMENT AND PLAN 43 y.o. female with hypertension, smoking, alcohol abuse, severe unexplained anemia (? 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 LP, labs for MS and mimics reviewed and are negative.   PLAN:  - increase amitriptyline to 50 mg at bedtime  - PT  evaluation for gait/mobility/alcoholic neuropathy, [facility] - Follow up in 6 months, sooner as needed.  Orders Placed This Encounter  Procedures  . Ambulatory referral to Physical Therapy   Meds ordered this encounter  Medications  . amitriptyline (ELAVIL) 25 MG tablet    Sig: Take 2 tablets (50 mg total) by mouth at bedtime.    Dispense:  60 tablet    Refill:  12    Order Specific Question:  Supervising Provider    Answer:  Penni Bombard [[id number]]   Return in about 6 months (around 04/08/2014).  Philmore Pali, MSN, NP-C 10/07/2013, 12:53 PM [facility] [address] [place], [place] [address] [phone]  Note: This document was prepared with digital dictation and possible smart phrase technology. Any transcriptional errors that result from this process are unintentional.

## 2013-10-07 NOTE — Patient Instructions (Signed)
Increase Amitriptyline to 1 and 1/2 tablets at bedtime for 1-2 weeks.  IF well tolerated, increase to 2 tablets per night.  I am ordering Physical Therapy at [facility] and have requested Beth.  Someone should call within 2 weeks to schedule.  Follow up in 6 months, sooner as needed.

## 2013-10-09 NOTE — Progress Notes (Signed)
I reviewed note and agree with plan.   Penni Bombard, MD 4/98/2641, 5:83 PM Certified in Neurology, Neurophysiology and Neuroimaging  [facility] [address] [place], [place] [address] [phone]

## 2013-10-12 ENCOUNTER — Ambulatory Visit: Payer: Self-pay | Admitting: Gastroenterology

## 2014-01-15 ENCOUNTER — Ambulatory Visit: Payer: Self-pay | Admitting: Oncology

## 2014-01-18 LAB — CBC CANCER CENTER
BASOS ABS: 0.2 x10 3/mm — AB (ref 0.0–0.1)
BASOS PCT: 1.3 %
EOS ABS: 0.2 x10 3/mm (ref 0.0–0.7)
Eosinophil %: 1.7 %
HCT: 48 % — ABNORMAL HIGH (ref 35.0–47.0)
HGB: 15.7 g/dL (ref 12.0–16.0)
LYMPHS PCT: 26.4 %
Lymphocyte #: 3.3 x10 3/mm (ref 1.0–3.6)
MCH: 30.3 pg (ref 26.0–34.0)
MCHC: 32.6 g/dL (ref 32.0–36.0)
MCV: 93 fL (ref 80–100)
Monocyte #: 0.6 x10 3/mm (ref 0.2–0.9)
Monocyte %: 5.2 %
NEUTROS ABS: 8.1 x10 3/mm — AB (ref 1.4–6.5)
Neutrophil %: 65.4 %
Platelet: 221 x10 3/mm (ref 150–440)
RBC: 5.18 10*6/uL (ref 3.80–5.20)
RDW: 15.4 % — AB (ref 11.5–14.5)
WBC: 12.4 x10 3/mm — ABNORMAL HIGH (ref 3.6–11.0)

## 2014-01-18 LAB — IRON AND TIBC
Iron Bind.Cap.(Total): 391 ug/dL (ref 250–450)
Iron Saturation: 55 %
Iron: 215 ug/dL — ABNORMAL HIGH (ref 50–170)
UNBOUND IRON-BIND. CAP.: 176 ug/dL

## 2014-01-18 LAB — FERRITIN: FERRITIN (ARMC): 45 ng/mL (ref 8–388)

## 2014-01-18 LAB — FOLATE: Folic Acid: 64.8 ng/mL (ref 3.1–100.0)

## 2014-01-18 LAB — TSH: Thyroid Stimulating Horm: 0.827 u[IU]/mL

## 2014-01-28 ENCOUNTER — Ambulatory Visit: Payer: Self-pay | Admitting: Oncology

## 2014-03-11 ENCOUNTER — Encounter: Payer: Self-pay | Admitting: Diagnostic Neuroimaging

## 2014-03-11 ENCOUNTER — Telehealth: Payer: Self-pay | Admitting: Diagnostic Neuroimaging

## 2014-03-11 NOTE — Telephone Encounter (Signed)
Called patient on both numbers listed regarding rescheduling 04/08/14 appointment per Jeani Hawking leaving, both numbers were disconnected. Printed and mailed letter with adjusted appointment time.

## 2014-04-08 ENCOUNTER — Ambulatory Visit: Payer: BC Managed Care – PPO | Admitting: Nurse Practitioner

## 2014-04-08 ENCOUNTER — Ambulatory Visit: Payer: Self-pay | Admitting: Diagnostic Neuroimaging

## 2014-04-13 ENCOUNTER — Encounter: Payer: Self-pay | Admitting: Diagnostic Neuroimaging

## 2014-05-06 IMAGING — CT CT CHEST W/O CM
1 of 2 series · 14 of 32 positions shown, 18 images · non-contrast
Comparison: none

REASON FOR EXAM: fu to lung nodule rt middle lobe
COMMENTS:

PROCEDURE:     KCT - KCT CHEST WITHOUT CONTRAST  - September 04, 2011 [DATE]
RESULT:     Comparison: 09/26/2010
TECHNIQUE: Multiple axial images of the chest were obtained without
intravenous contrast.

[Series 2: chest w/o 3.0 i41f 3 · axial · non-contrast · 0.80mm/px · z∈[-189,+69]mm · 14 of 102 slices shown, 18 images]
[im 8/102  mediastinal]
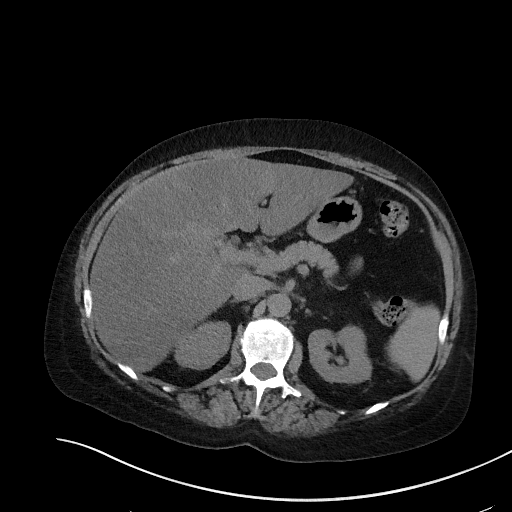
[im 8/102  lung]
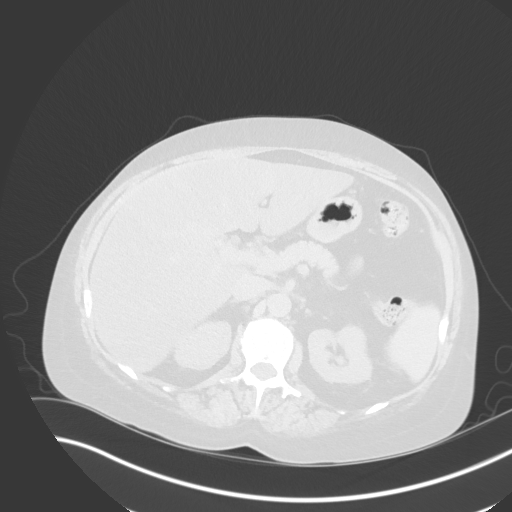
[im 16/102  lung]
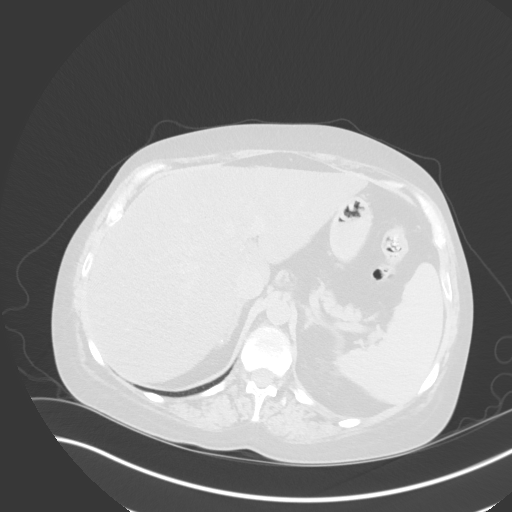
[im 24/102  lung]
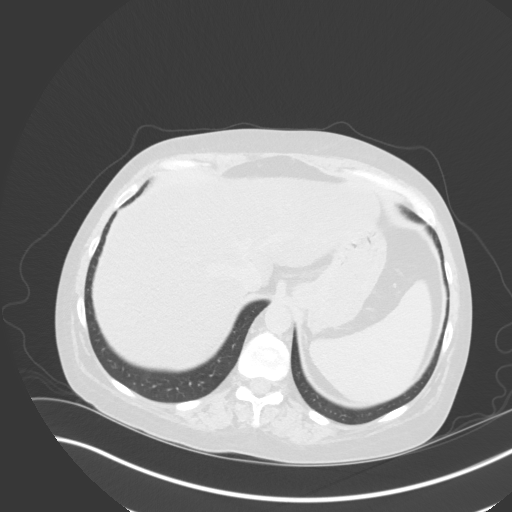
[im 32/102  lung]
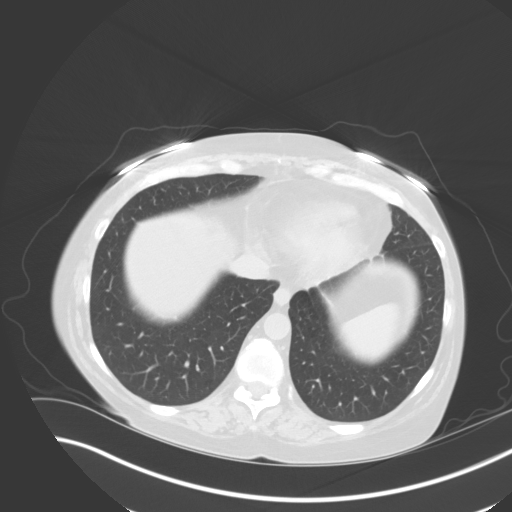
[im 39/102  mediastinal]
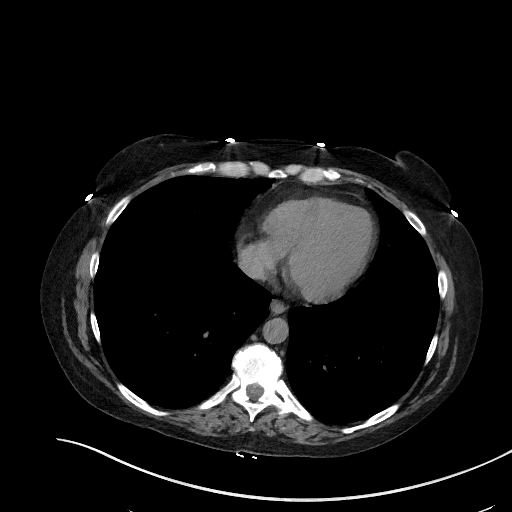
[im 39/102  lung]
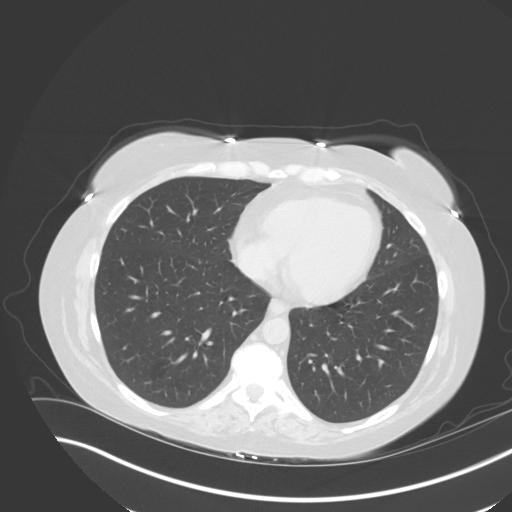
[im 47/102  lung]
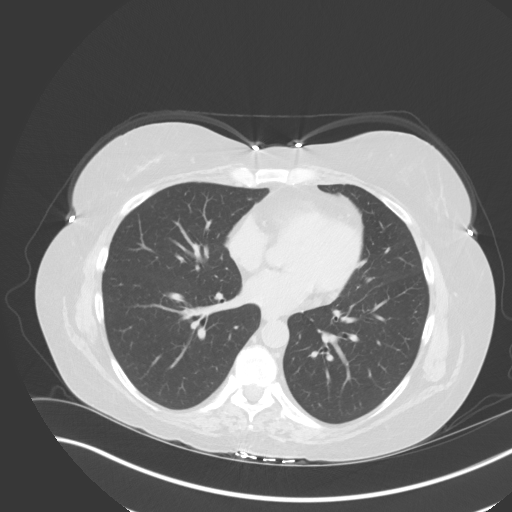
[im 49/102  lung]
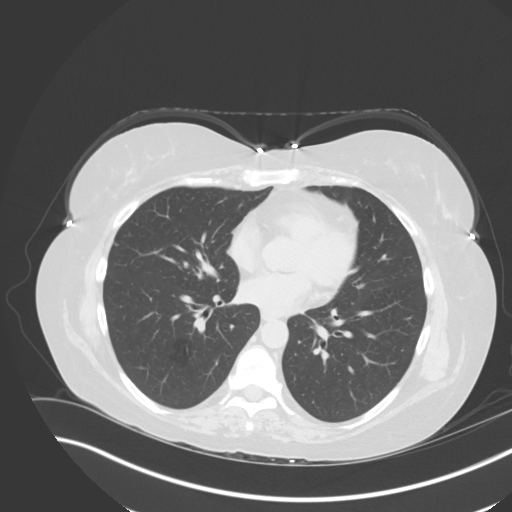
[im 51/102  lung]
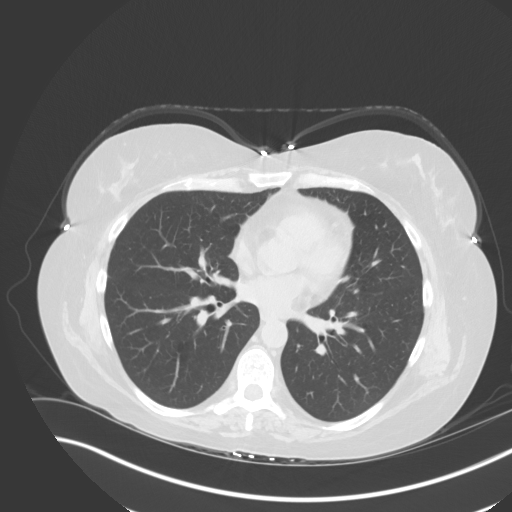
[im 55/102  mediastinal]
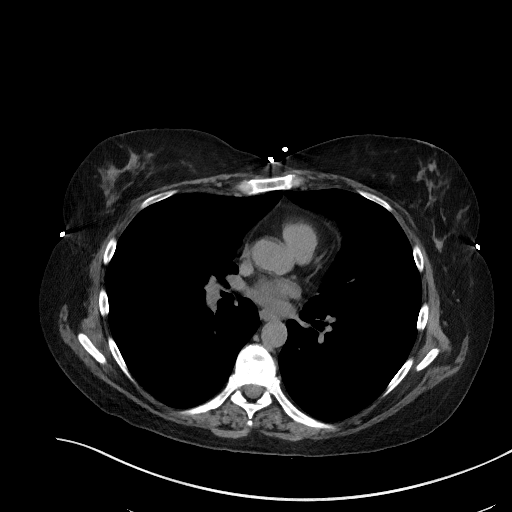
[im 55/102  lung]
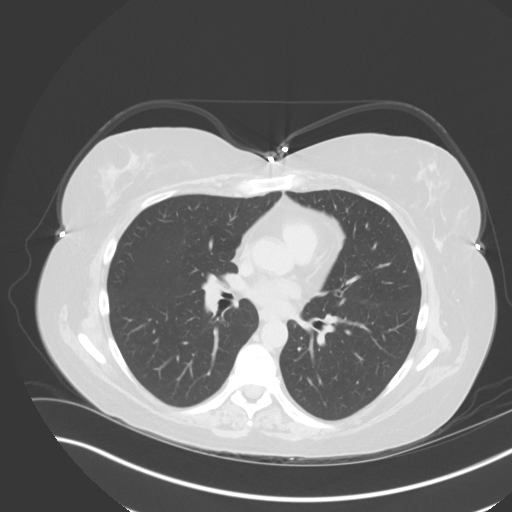
[im 63/102  lung]
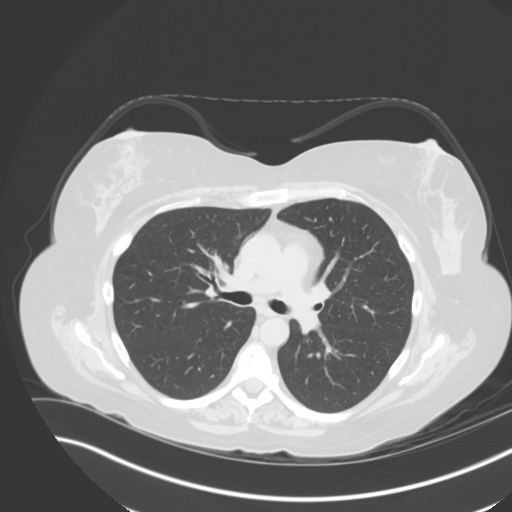
[im 70/102  lung]
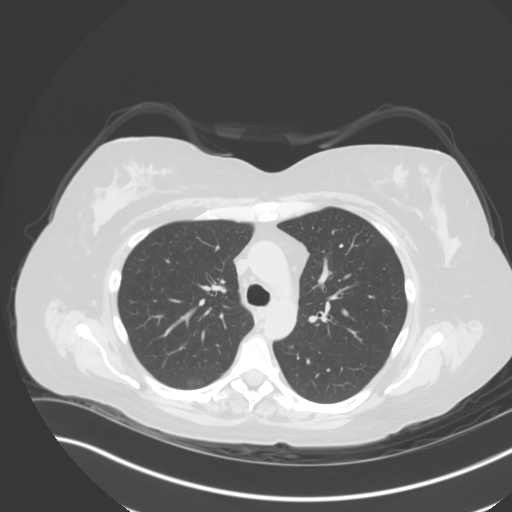
[im 78/102  lung]
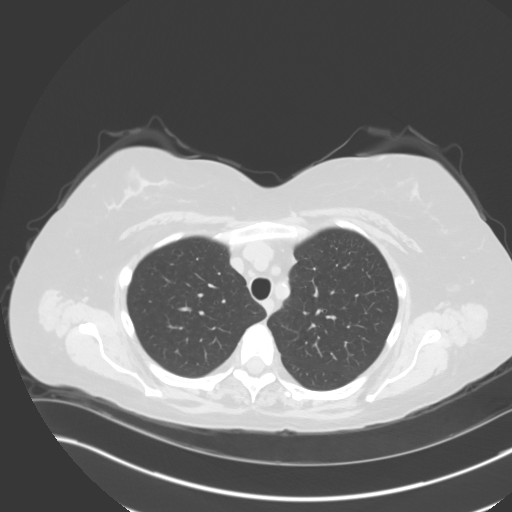
[im 86/102  mediastinal]
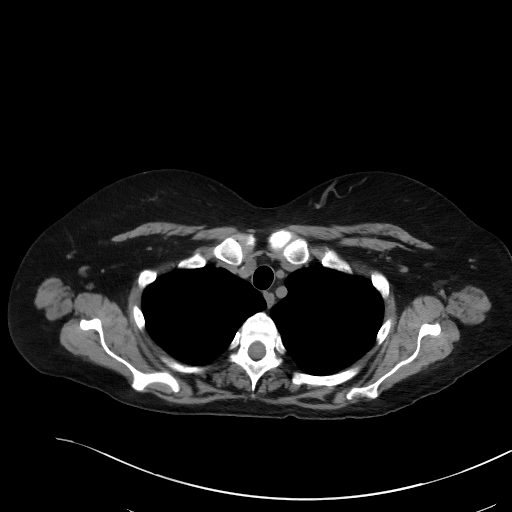
[im 86/102  lung]
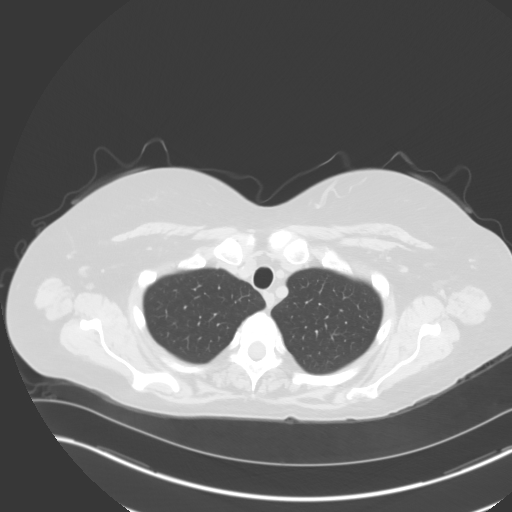
[im 94/102  lung]
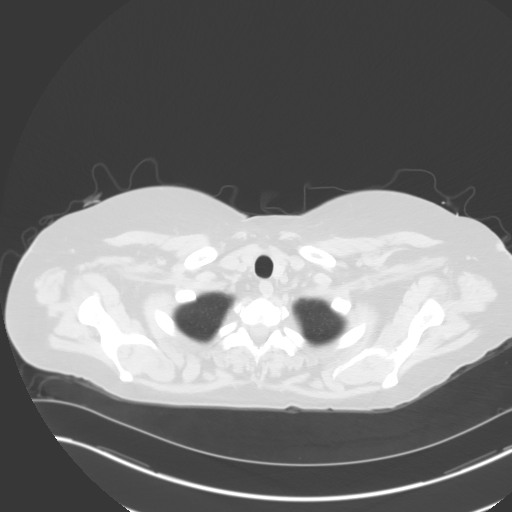

[14 of 32 positions shown; findings below may reference images not displayed]

FINDINGS: No mediastinal, hilar, or axillary lymphadenopathy. The liver is somewhat
low in attenuation, suggesting hepatic steatosis. There are a few punctate
calcifications in the right adrenal gland, likely sequela of old prior
trauma or old infection. 3 mm nodule at the periphery the right middle lobe
is similar to prior, image 54. 3 mm subpleural linear density in the
periphery of the right middle lobe is also similar to prior. This may
represent a tiny area of scarring. The central airways are patent.

No aggressive lytic or sclerotic osseous lesions are identified.
IMPRESSION: The 3 mm nodule in the right middle lobe is similar to prior and therefore
considered benign. No further followup is necessary.

## 2014-08-17 NOTE — H&P (Signed)
PATIENT NAME:  Beth [name], Beth [name] MR#:  [id number] DATE OF BIRTH:  [birth year]  DATE OF ADMISSION:  01/24/2012  PRIMARY CARE PHYSICIAN: Nurse practitioner [name].  PHYSICIAN: Golden Pop.  HEMATOLOGIST:  Dr Grayland Ormond.   CHIEF COMPLAINT: The patient came here because of cough, however, she was found to have severe anemia, hypomagnesemia and hypokalemia.   HISTORY OF PRESENT ILLNESS: Beth [name] is a 44 year old Caucasian female with history of megaloblastic anemia. Used to follow up with Dr. Grayland Ormond. She lost followup for a while, as he referred her to her primary care physician. The patient is a chronic smoker and she is alcoholic. She came here because she was coughing in the last few days and her cough had persisted. She came here seeking medical advice. Blood work-up revealed severe anemia with hemoglobin reaching seven, and severe hypokalemia with potassium of two, hypomagnesemia at 1.2. Phosphorous was low. She has elevated liver enzymes and hypoalbuminemia. The patient reports mild fatigue. She continues to abuse alcohol and to smoke. The patient was admitted for observation to receive a blood transfusion and to correct the electrolyte abnormalities.   REVIEW OF SYSTEMS.   CONSTITUTIONAL: Denies any fever. No chills. No night sweats but reports mild fatigue.   EYES: No blurring of vision. No double vision.   ENT: No hearing impairment. No sore throat. No dysphagia.   CARDIOVASCULAR: No chest pain, only recently with cough, her chest became sore. No edema. No syncope.   RESPIRATORY: Reports cough. No shortness of breath. No hemoptysis.   GASTROINTESTINAL: No abdominal pain. No vomiting. No diarrhea. No hematochezia.   GENITOURINARY: No dysuria. No frequency of urination.   MUSCULOSKELETAL: No joint pain or swelling. No muscular pain or swelling.   INTEGUMENTARY: No skin rash. No ulcers.   NEUROLOGY: No focal weakness. No seizure activity. No headache.   PSYCHIATRY: No  anxiety. No depression.   ENDOCRINE: No polyuria or polydipsia. No heat or cold intolerance.   PAST MEDICAL HISTORY:  1. Megaloblastic anemia. She had work-up with Dr. Grayland Ormond including multiple blood work-up, bone marrow biopsy. She had several blood transfusions in the past to correct her anemia. 2. Chronic alcoholic. 3. Tobacco abuse.  4. Peripheral neuropathy.  5. Systemic hypertension.   SOCIAL HABITS: Chronic smoker of 1 pack per day since age of 71. She drinks alcohol daily about 6 to 7 drinks, this is liquor. She called it [place] She had been drinking for the last 10 years. The patient indicates that she may quit alcohol from time to time for a week or two without any problem. She is married. Her husband just died a couple days ago and they buried  him today. He died from liver cirrhosis.   FAMILY HISTORY: Her father died at the age of 76 from renal failure. Her mother died from metastatic lung cancer.   ADMISSION MEDICATIONS:  Potassium chloride 1 tablet once a day. Lisinopril 20 mg once a day, gabapentin 300 mg once a day, folic acid 1 mg once a day.   ALLERGIES: Amoxicillin causing itching and rash.   PHYSICAL EXAMINATION:  VITAL SIGNS: Blood pressure 93/53, respiratory rate 18, pulse 93, temperature 96.4, oxygen saturation 100%.   GENERAL APPEARANCE: Young female lying in bed in no acute distress.   HEAD: She has mild pallor. No icterus. No cyanosis.   ENT:  Hearing was normal. Nasal mucosa, lips, tongue were normal.   EYES: Normal iris and conjunctivae. Pupils about 6 mm, equal and reactive to light.  NECK: Supple. Trachea at midline. No cervical lymphadenopathy. No masses.   HEART: Normal S1, S2. No S3 or S4. No murmur. No gallop. No carotid bruits.   RESPIRATORY: Normal breathing pattern without use of accessory muscles. No rales. No wheezing.   ABDOMEN: Soft without tenderness. No hepatosplenomegaly. No masses. No hernias.   SKIN: No ulcers. No  subcutaneous nodules.   MUSCULOSKELETAL: No joint swelling. No clubbing.   NEUROLOGIC: Cranial nerves II through XII are intact. No focal motor deficit.   PSYCHIATRY: The patient is alert and oriented x3. Mood and affect were normal.   LABORATORY, DIAGNOSTIC, AND RADIOLOGICAL DATA: Chest x-ray showed heart size was normal. No consolidation, no effusion. Serum glucose 99, BUN 7 creatinine 0.6, sodium 131, potassium 2, chloride 89, calcium 7.6 with albumin of 2.9. Serum phosphorus was low at 2.4, magnesium 1.2, total protein 6.3, and albumin again is 2.9, bilirubin 0.4, alkaline phosphatase elevated to 24, AST elevated at 47. Normal ALT 22. CBC showed white count 12,000, hemoglobin seven, hematocrit 19, platelet count 342, MCV elevated 113, MCH 39, MCHC 35.   ASSESSMENT:  1. Severe anemia with hemoglobin of seven with history of megaloblastic anemia secondary to vitamin B12 deficiency. 2. Cough secondary to acute bronchitis.  3. Hypokalemia.  4. Hypomagnesemia.  5. Hypophosphatemia. 6. Hypoalbuminemia. 7. Alcoholic hepatitis and chronic alcoholic disease.  8. Tobacco abuse.   PLAN: Will admit the patient for observation, blood transfusion of 1 unit of packed red blood cells. Restart intramuscular vitamin B12 1000 mcg daily. Thiamine supplementation. Correct potassium and magnesium and phosphate level. I will hold lisinopril since her blood pressure is on the low side. Start Levaquin 500 mg a day for her bronchitis. The patient needs to quit smoking and to quit alcohol. She was advised and counseled regarding  this. I will start nicotine patch. Again antibiotic was initiated using Levaquin 500 mg daily orally.   Time Spent evaluating this patient: More than 55 minutes.    ____________________________ Clovis Pu. Lenore Manner, MD amd:ljs D: 01/24/2012 00:46:49 ET T: 01/24/2012 07:59:25 ET JOB#: [id number]  cc: Clovis Pu. Lenore Manner, MD, <Dictator> Hervey Ard. Julian Hy, NP Clovis Pu Santiaga Butzin MD ELECTRONICALLY  SIGNED 01/24/2012 22:33

## 2014-08-17 NOTE — Consult Note (Signed)
Brief Consult Note: Diagnosis: anemia.   Discussed with Attending MD.   Comments: Discussed with Dr Manuella Ghazi.  Will hold consult for now.  Electronic Signatures: Loistine Simas (MD)  (Signed 28-Sep-13 12:04)  Authored: Brief Consult Note   Last Updated: 28-Sep-13 12:04 by Loistine Simas (MD)

## 2014-08-17 NOTE — Consult Note (Signed)
History of Present Illness:   Reason for Consult Anemia.    HPI   Patient last evaluated in clinic in May of 2013.  Patient presented to the ER with cough and shortness of breath and was found to be significantly anemic on routine blood work.  Patient was subsequently admitted receive one unit of packed red blood cells as well as B12 injection last night.  Patient states that other than the death of her husband this week, she has felt well. She does not complain of weakness, dyspnea exertion, or dizziness.  She continues to be active.  She denies any neurologic complaints.  She has no chest pain. She has good appetite and denies any nausea, vomiting, constipation, or diarrhea.  She has no melena or hematochezia.  She denies any recent fevers or illnesses.  Patient offers no further specific complaints today.  PFSH:   Additional Past Medical and Surgical History Past medical history: Hypertension.  Past surgical history: Negative.  Family history: Noncontributory.  Social history: Has 4-5 alcoholic drinks per day, 4-5 days per week.  Positive tobacco.   Review of Systems:   Performance Status (ECOG) 0    Review of Systems   As per HPI. Otherwise, 10 point system review was negative.  NURSING NOTES: **Vital Signs.:   26-Sep-13 05:58    Vital Signs Type: Routine    Temperature Temperature (F): 98.1    Celsius: 36.7    Temperature Source: Oral    Pulse Pulse: 90    Respirations Respirations: 18    Systolic BP Systolic BP: 465    Diastolic BP (mmHg) Diastolic BP (mmHg): 68    Mean BP: 80    Pulse Ox % Pulse Ox %: 97    Pulse Ox Activity Level: At rest    Oxygen Delivery: 2L   Physical Exam:   Physical Exam General: Well-developed, well-nourished, no acute distress. Eyes: Pink conjunctiva, anicteric sclera. HEENT: Normocephalic, moist mucous membranes, clear oropharnyx. Lungs: Clear to auscultation bilaterally. Heart: Regular rate and rhythm. No rubs, murmurs, or  gallops. Abdomen: Soft, nontender, nondistended. No organomegaly noted, normoactive bowel sounds. Musculoskeletal: No edema, cyanosis, or clubbing. Neuro: Alert, answering all questions appropriately. Cranial nerves grossly intact. Skin: No rashes or petechiae noted. Psych: Normal affect. Lymphatics: No cervical, calvicular, axillary or inguinal LAD.    Amoxicillin: Itching, Rash    Ativan 0.5 mg oral tablet: tab(s) orally 2 times a day, As Needed- for Anxiety, Nervousness , Active, 0, None   omeprazole 20 mg oral delayed release tablet: 1 tab(s) orally once a day, Active, 0, None  Laboratory Results: Routine Chem:  26-Sep-13 09:46    Magnesium, Serum 2.0 (1.8-2.4 THERAPEUTIC RANGE: 4-7 mg/dL TOXIC: > 10 mg/dL  -----------------------)   Result Comment mcv - RESULTS VERIFIED BY REPEAT TESTING.  Result(s) reported on 24 Jan 2012 at 10:51AM.   Glucose, Serum  120   BUN  4   Creatinine (comp) 0.79   Sodium, Serum 137   Potassium, Serum  3.2   Chloride, Serum  97   CO2, Serum 30   Calcium (Total), Serum  7.2   Anion Gap 10   Osmolality (calc) 272   eGFR (African American) >60   eGFR (Non-African American) >60 (eGFR values <16m/min/1.73 m2 may be an indication of chronic kidney disease (CKD). Calculated eGFR is useful in patients with stable renal function. The eGFR calculation will not be reliable in acutely ill patients when serum creatinine is changing rapidly. It is not useful in  patients on dialysis. The eGFR calculation may not be applicable to patients at the low and high extremes of body sizes, pregnant women, and vegetarians.)  Routine Hem:  26-Sep-13 09:46    WBC (CBC) 6.7   RBC (CBC)  2.11   Hemoglobin (CBC)  7.6   Hematocrit (CBC)  21.5   Platelet Count (CBC) 237   MCV  102   MCH  35.9   MCHC 35.2   RDW  26.9   Neutrophil % 77.9   Lymphocyte % 17.0   Monocyte % 2.0   Eosinophil % 2.2   Basophil % 0.9   Neutrophil # 5.2   Lymphocyte # 1.1    Monocyte #  0.1   Eosinophil # 0.1   Basophil # 0.1   Assessment and Plan:  Impression:   Megaloblastic anemia.  Plan:   1.  Megaloblastic anemia: Patient's hemoglobin have significantly dropped over the past several months.  She received one unit packed red blood cells yesterday.  Agree with rechecking hemoglobin the morning to assure it has remained stable.  Will check folic acid levels and stores for completeness. Previously, bone marrow biopsy was consistent with megaloblastic anemia.  Patient will be given appointment in the [facility] for 1-2 weeks for repeat laboratory work and further evaluation. consult, call with questions.  Electronic Signatures: Delight Hoh (MD)  (Signed 26-Sep-13 14:00)  Authored: HISTORY OF PRESENT ILLNESS, PFSH, ROS, NURSING NOTES, PE, ALLERGIES, HOME MEDICATIONS, LABS, ASSESSMENT AND PLAN   Last Updated: 26-Sep-13 14:00 by Delight Hoh (MD)

## 2014-08-17 NOTE — Discharge Summary (Signed)
PATIENT NAME:  Beth [name], KASEL MR#:  [id number] DATE OF BIRTH:  22-Mar-1971  DATE OF ADMISSION:  01/25/2012 DATE OF DISCHARGE:  01/26/2012  DISCHARGE DIAGNOSES:  1. Severe megaloblastic anemia, status post two packed red blood cell transfusions, now hemodynamically stable.  2. Low folate level and is being replaced.  3. Acute bronchitis, improving.  4. Hypokalemia/hypomagnesemia/hypophosphatemia, repleted and improved.   SECONDARY DIAGNOSES:  1. History of megaloblastic anemia.  2. Chronic alcoholism.  3. Peripheral neuropathy.  4. Hypertension.   CONSULTATION: Oncology, Dr. Grayland Ormond.   PROCEDURES/RADIOLOGY: Chest x-ray on 01/23/2012 showed no acute cardiopulmonary disease. Hemoccult stool was negative.   HISTORY AND SHORT HOSPITAL COURSE: The patient is a 44 year old female with the above-mentioned medical problems, was admitted for severe megaloblastic anemia. Please see Dr. Zacarias Pontes dictated history and physical for further details. The patient was very symptomatic with hemoglobin of 7 on admission, was given 1 pack red blood cell transfusion. She also had a significant electrolyte abnormalities including magnesium 1.2, phosphorous of 2.4, and potassium of 2.0. She was started on aggressive electrolyte replacement along with IV fluids as her sodium was also low. She was slowly improving. On 01/25/2012, hemoglobin again dropped to 6.7 and she was given one more unit of packed red blood cell transfusion. She had her folic acid level checked, and it was found to be low, and she was started on replacement. She was also given a vitamin B12 injection and was feeling much better on the 01/26/2012. She was close to her baseline and was discharged home in stable condition.   PERTINENT DISCHARGE PHYSICAL EXAMINATION: VITAL SIGNS: Temperature 97.8, heart rate 89 per minute, respirations 18 per minute, blood pressure 121/85 mmHg. She was saturating 92% on room air. CARDIOVASCULAR: S1, S2 normal. No  murmurs, rubs, or gallop. LUNGS: Clear to auscultation bilaterally. No wheezing, rales, rhonchi,  or crepitation.  ABDOMEN: Soft, benign. NEUROLOGIC: Nonfocal examination. All other physical examination remained at baseline.   DISCHARGE MEDICATIONS:  1. Ativan 0.5 mg p.o. b.i.d. as needed.  2. Omeprazole 20 mg p.o. daily. 3. Gabapentin  100 mg p.o. at bedtime.  4. Magnesium oxide 400 mg p.o. daily for 7 days.  5. Levaquin 500 mg p.o. daily for two days.  6. Folic acid 1 mg p.o. daily.  7. One a Day multivitamin once daily.   DISCHARGE DIET: Regular.   DISCHARGE ACTIVITY: As tolerated.   DISCHARGE INSTRUCTIONS AND FOLLOWUP:  1. The patient was instructed to follow up with her primary care physician, Beth [name], with [facility] in 1 to 2 weeks.  2. She will need followup with Dr. Grayland Ormond as scheduled on 02/05/2012.  3. She was instructed to eat nutritious food, including fruits, especially bananas, pineapple for her potassium.   TOTAL TIME DISCHARGING THIS PATIENT: 55 minutes.  ____________________________ Lucina Mellow. Manuella Ghazi, MD vss:cbb D: 01/26/2012 14:11:25 ET T: 01/27/2012 16:29:27 ET JOB#: [id number]  cc: Angeligue Bowne S. Manuella Ghazi, MD, <Dictator> Hervey Ard. Julian Hy, NP [place] Grayland Ormond, MD Lollie Sails, MD Lucina Mellow [facility] MD ELECTRONICALLY SIGNED 01/29/2012 0:17

## 2014-09-24 IMAGING — CR DG CHEST 2V
1 series · 3 of 3 positions shown · non-contrast
Comparison: none

REASON FOR EXAM: SOB
COMMENTS:

[Series 1: w chest pa · 0.14mm/px · 3 of 3 slices shown]
[im 1/3]
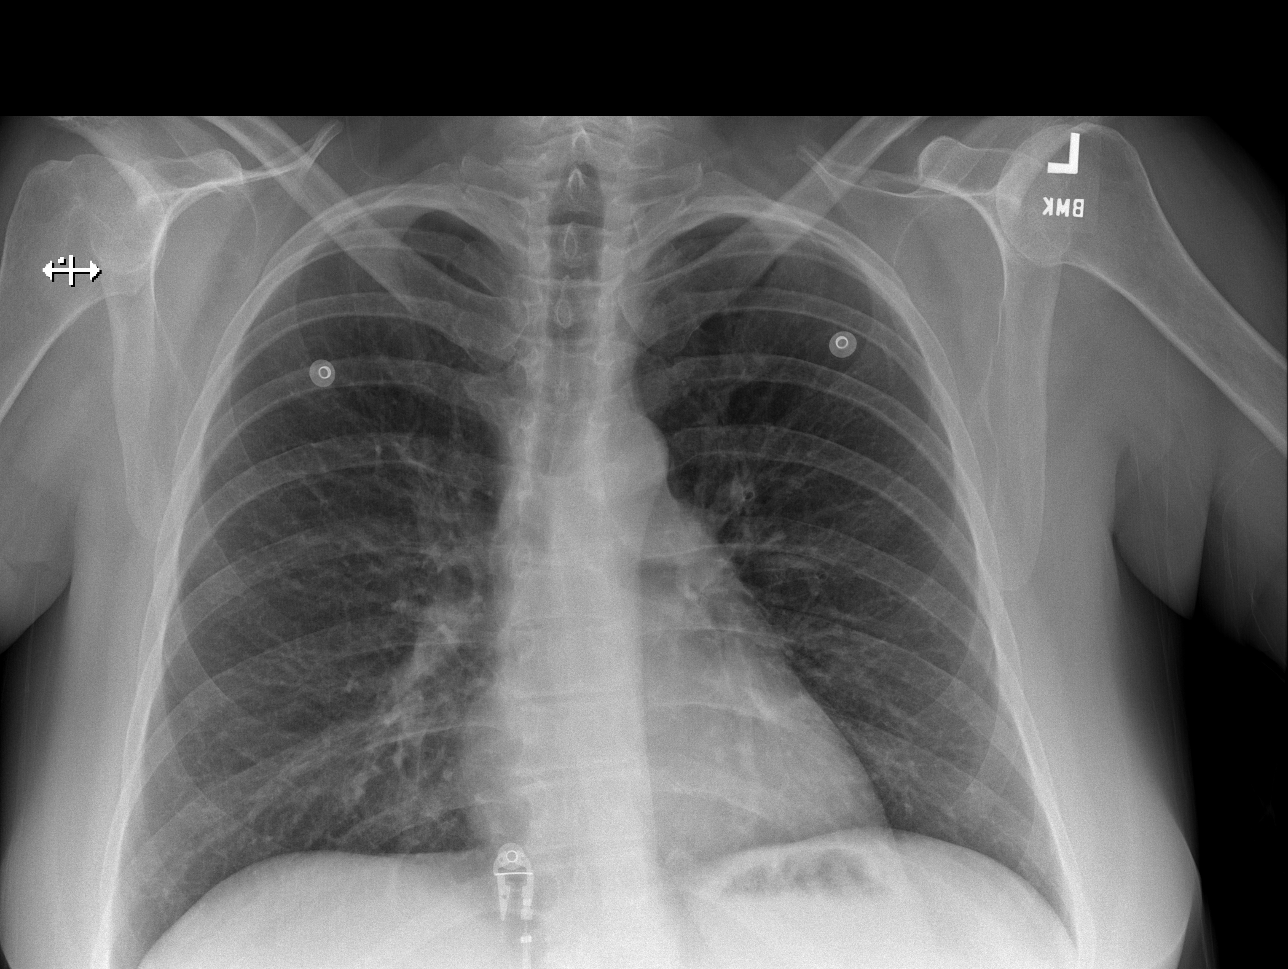
[im 2/3]
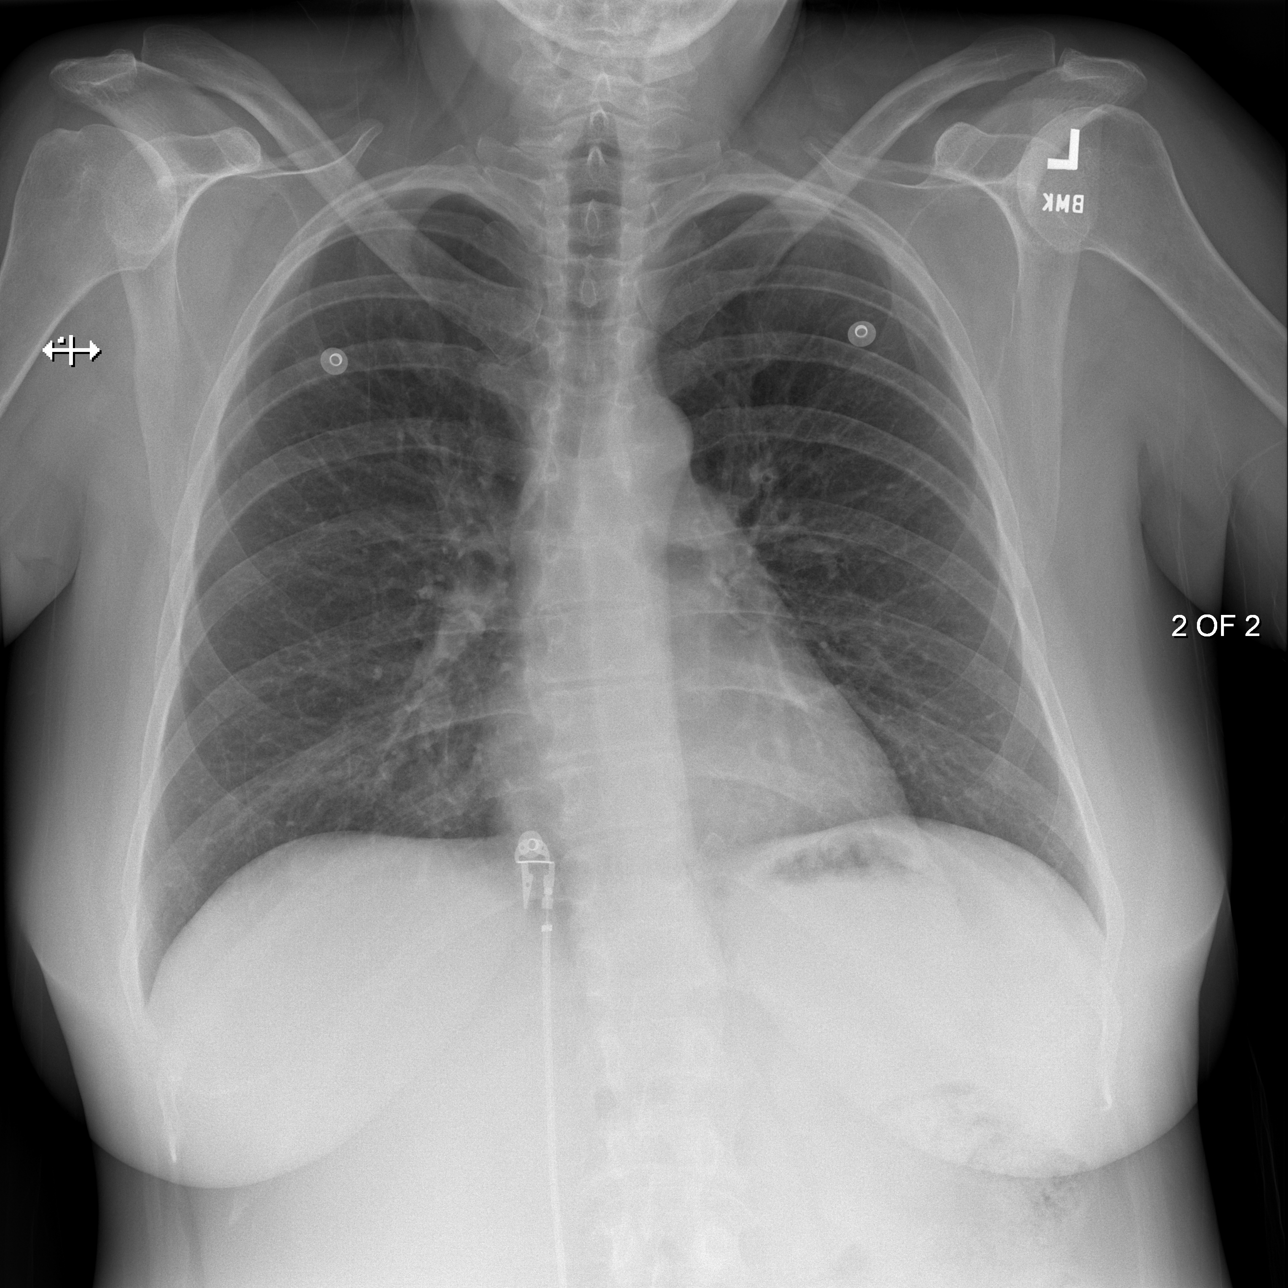
[im 3/3]
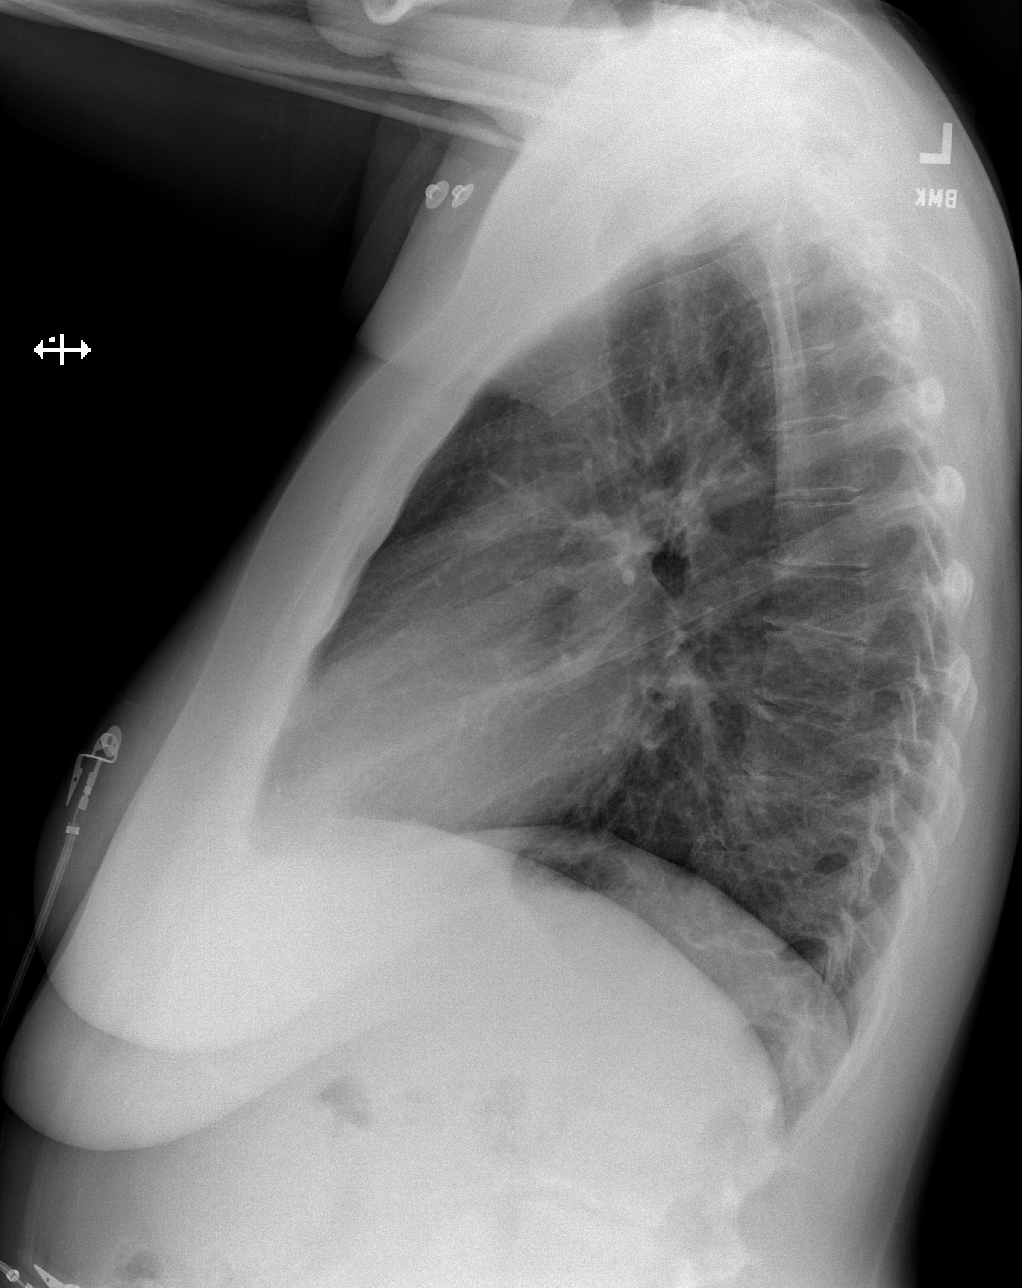

[3 of 3 positions shown; findings below may reference images not displayed]

PROCEDURE:     DXR - DXR CHEST PA (OR AP) AND LATERAL  - January 23, 2012  [DATE]

RESULT:     Comparison is made to the study 16 January, 2012.

The lungs are adequately inflated. There is no focal infiltrate. The cardiac
silhouette is normal in size. The pulmonary vascularity is not engorged.
There is no pleural effusion or pneumothorax or pneumomediastinum. The bony
thorax exhibits no acute abnormality. Very gentle curvature of the mid
thoracic spine convex toward the right is present.
IMPRESSION: There is no evidence of acute cardiopulmonary abnormality.

[REDACTED]

## 2014-10-10 ENCOUNTER — Other Ambulatory Visit: Payer: Self-pay | Admitting: Nurse Practitioner

## 2014-11-07 ENCOUNTER — Other Ambulatory Visit: Payer: Self-pay | Admitting: Diagnostic Neuroimaging

## 2014-11-09 ENCOUNTER — Telehealth: Payer: Self-pay | Admitting: Family Medicine

## 2014-11-09 NOTE — Telephone Encounter (Signed)
I spoke with her sister, I advised her that last rx written by neurology in June stated she needed an appointment. She states they knew nothing about it. I advised her to call their office back and have her schedule an appointment and that they would probably call in enough to cover her until her next appointment. She states they have been trying to call them but they won't return their calls. She asked if you'd be willing to renew it for her.  Dr. Sanda Klein, when I click on "reorder" it comes up with a screen saying there is a pended reorder but I checked with the pharmacy and they had no refills on file.

## 2014-11-09 NOTE — Telephone Encounter (Signed)
PTS SISTER CALLED TO LET us KNOW THAT THE NEUROLOGIST IS NOT RESPONDING TO THE REFILL REQUEST SENT IN FROM THE PHARMACY. THE MEDICATION THE PT NEEDS IS THE AMITRIPTYLINE 25 MG AND SHE WOULD LIKE IT TO GO TO [facility] ON [address]

## 2014-11-10 ENCOUNTER — Telehealth: Payer: Self-pay

## 2014-11-10 NOTE — Telephone Encounter (Signed)
They need a letter stating she is disabled and can not work for her food stamps. Letter must have patient's name and DOB. Attn: Forensic scientist to [phone]

## 2014-11-11 NOTE — Telephone Encounter (Signed)
Left message to call.

## 2014-11-11 NOTE — Telephone Encounter (Signed)
This really needs to come from the neurologist; sorry; I do not want to get between her and her specialist; the dose may need to be adjusted/changed or monitored Have them address this with neurology please

## 2014-11-11 NOTE — Telephone Encounter (Signed)
No answer

## 2014-11-11 NOTE — Telephone Encounter (Signed)
If they could please get [place] documentation of her disability; we are in a new computer system and I don't have any paperwork from the courts that state she is disabled in our new record Thank you.

## 2014-11-15 ENCOUNTER — Telehealth: Payer: Self-pay | Admitting: Family Medicine

## 2014-11-15 NOTE — Telephone Encounter (Signed)
Patient notified

## 2014-11-15 NOTE — Telephone Encounter (Signed)
I talked to the patient. She states that Beth [name] took her out of work back in Dec. 2012 for Neuropathy and she can't walk good. She just had her disability hearing on June 22nd. She hasn't actually been approved for disability yet.

## 2014-11-15 NOTE — Telephone Encounter (Signed)
Pt called to check the status of a letter being written for [employer] Food Stamps. Please call pt with an update. Thanks.

## 2014-11-15 NOTE — Telephone Encounter (Signed)
This is a duplicate message.

## 2014-11-16 NOTE — Telephone Encounter (Signed)
Patient notified

## 2014-11-16 NOTE — Telephone Encounter (Signed)
I cannot write a letter that states she is disabled.  That is not my decision to make. That is a legal determination and why she is having to go to court. Once the court decides she is disabled, then I can write a letter to explain what the court has decided. Sorry.

## 2014-12-09 ENCOUNTER — Telehealth: Payer: Self-pay | Admitting: Family Medicine

## 2014-12-09 NOTE — Telephone Encounter (Signed)
PLease call pt ASAP. Thanks.

## 2014-12-09 NOTE — Telephone Encounter (Signed)
No answer, unable to leave a voicemail. °

## 2014-12-10 NOTE — Telephone Encounter (Signed)
Called patient no answer, unable to leave a message.

## 2014-12-14 ENCOUNTER — Emergency Department: Payer: PRIVATE HEALTH INSURANCE

## 2014-12-14 ENCOUNTER — Encounter: Payer: Self-pay | Admitting: Urgent Care

## 2014-12-14 ENCOUNTER — Inpatient Hospital Stay
Admission: EM | Admit: 2014-12-14 | Discharge: 2014-12-16 | DRG: 641 | Disposition: A | Discharge status: Home / Self Care | Payer: PRIVATE HEALTH INSURANCE | Attending: Internal Medicine | Admitting: Internal Medicine

## 2014-12-14 DIAGNOSIS — Z9851 Tubal ligation status: Secondary | ICD-10-CM

## 2014-12-14 DIAGNOSIS — M549 Dorsalgia, unspecified: Secondary | ICD-10-CM | POA: Diagnosis present

## 2014-12-14 DIAGNOSIS — Z888 Allergy status to other drugs, medicaments and biological substances status: Secondary | ICD-10-CM

## 2014-12-14 DIAGNOSIS — Z79899 Other long term (current) drug therapy: Secondary | ICD-10-CM

## 2014-12-14 DIAGNOSIS — R531 Weakness: Secondary | ICD-10-CM

## 2014-12-14 DIAGNOSIS — M1990 Unspecified osteoarthritis, unspecified site: Secondary | ICD-10-CM | POA: Diagnosis present

## 2014-12-14 DIAGNOSIS — E876 Hypokalemia: Principal | ICD-10-CM | POA: Diagnosis present

## 2014-12-14 DIAGNOSIS — R112 Nausea with vomiting, unspecified: Secondary | ICD-10-CM

## 2014-12-14 DIAGNOSIS — Z833 Family history of diabetes mellitus: Secondary | ICD-10-CM

## 2014-12-14 DIAGNOSIS — I10 Essential (primary) hypertension: Secondary | ICD-10-CM | POA: Diagnosis present

## 2014-12-14 DIAGNOSIS — K760 Fatty (change of) liver, not elsewhere classified: Secondary | ICD-10-CM | POA: Diagnosis present

## 2014-12-14 DIAGNOSIS — Z809 Family history of malignant neoplasm, unspecified: Secondary | ICD-10-CM

## 2014-12-14 DIAGNOSIS — K529 Noninfective gastroenteritis and colitis, unspecified: Secondary | ICD-10-CM | POA: Diagnosis present

## 2014-12-14 DIAGNOSIS — Z9889 Other specified postprocedural states: Secondary | ICD-10-CM

## 2014-12-14 DIAGNOSIS — G8929 Other chronic pain: Secondary | ICD-10-CM | POA: Diagnosis present

## 2014-12-14 DIAGNOSIS — F17210 Nicotine dependence, cigarettes, uncomplicated: Secondary | ICD-10-CM | POA: Diagnosis present

## 2014-12-14 DIAGNOSIS — Z880 Allergy status to penicillin: Secondary | ICD-10-CM

## 2014-12-14 DIAGNOSIS — F1010 Alcohol abuse, uncomplicated: Secondary | ICD-10-CM | POA: Diagnosis present

## 2014-12-14 HISTORY — DX: Dorsalgia, unspecified: M54.9

## 2014-12-14 HISTORY — DX: Fatty (change of) liver, not elsewhere classified: K76.0

## 2014-12-14 HISTORY — DX: Other chronic pain: G89.29

## 2014-12-14 HISTORY — DX: Unspecified osteoarthritis, unspecified site: M19.90

## 2014-12-14 LAB — URINALYSIS COMPLETE WITH MICROSCOPIC (ARMC ONLY)
Bilirubin Urine: NEGATIVE
GLUCOSE, UA: NEGATIVE mg/dL
HGB URINE DIPSTICK: NEGATIVE
Ketones, ur: NEGATIVE mg/dL
Nitrite: NEGATIVE
PH: 6 (ref 5.0–8.0)
PROTEIN: NEGATIVE mg/dL
Specific Gravity, Urine: 1.015 (ref 1.005–1.030)

## 2014-12-14 LAB — COMPREHENSIVE METABOLIC PANEL
ALT: 30 U/L (ref 14–54)
AST: 112 U/L — AB (ref 15–41)
Albumin: 3.6 g/dL (ref 3.5–5.0)
Alkaline Phosphatase: 152 U/L — ABNORMAL HIGH (ref 38–126)
Anion gap: 17 — ABNORMAL HIGH (ref 5–15)
CHLORIDE: 88 mmol/L — AB (ref 101–111)
CO2: 33 mmol/L — AB (ref 22–32)
CREATININE: 0.8 mg/dL (ref 0.44–1.00)
Calcium: 6.7 mg/dL — ABNORMAL LOW (ref 8.9–10.3)
GFR calc non Af Amer: >60 mL/min (ref >60)
Glucose, Bld: 128 mg/dL — ABNORMAL HIGH (ref 65–99)
Potassium: 2.2 mmol/L — CL (ref 3.5–5.1)
SODIUM: 138 mmol/L (ref 135–145)
Total Bilirubin: 0.5 mg/dL (ref 0.3–1.2)
Total Protein: 7.1 g/dL (ref 6.5–8.1)

## 2014-12-14 LAB — CBC
HCT: 41.5 % (ref 35.0–47.0)
Hemoglobin: 14.1 g/dL (ref 12.0–16.0)
MCH: 37.3 pg — AB (ref 26.0–34.0)
MCHC: 33.9 g/dL (ref 32.0–36.0)
MCV: 110 fL — AB (ref 80.0–100.0)
PLATELETS: 301 10*3/uL (ref 150–440)
RBC: 3.77 MIL/uL — AB (ref 3.80–5.20)
RDW: 16 % — ABNORMAL HIGH (ref 11.5–14.5)
WBC: 11.1 10*3/uL — ABNORMAL HIGH (ref 3.6–11.0)

## 2014-12-14 LAB — MAGNESIUM: MAGNESIUM: 1 mg/dL — AB (ref 1.7–2.4)

## 2014-12-14 LAB — LIPASE, BLOOD: LIPASE: 19 U/L — AB (ref 22–51)

## 2014-12-14 MED ORDER — MORPHINE SULFATE (PF) 2 MG/ML IV SOLN: 2.0000 mg | INTRAVENOUS | Status: DC | PRN

## 2014-12-14 MED ORDER — OXYCODONE HCL 5 MG PO TABS
5.0000 mg | ORAL_TABLET | ORAL | Status: DC | PRN
  Administered 2014-12-15: 5 mg via ORAL

## 2014-12-14 MED ORDER — POTASSIUM CHLORIDE IN NACL 20-0.9 MEQ/L-% IV SOLN
INTRAVENOUS | Status: DC
  Administered 2014-12-15 (×2): via INTRAVENOUS
  Filled 2014-12-14 (×7): qty 1000

## 2014-12-14 MED ORDER — ONDANSETRON HCL 4 MG/2ML IJ SOLN
4.0000 mg | Freq: Four times a day (QID) | INTRAMUSCULAR | Status: DC | PRN
  Administered 2014-12-15: 4 mg via INTRAVENOUS
  Filled 2014-12-14: qty 2

## 2014-12-14 MED ORDER — HEPARIN SODIUM (PORCINE) 5000 UNIT/ML IJ SOLN
5000.0000 [IU] | Freq: Three times a day (TID) | INTRAMUSCULAR | Status: DC
  Administered 2014-12-15 – 2014-12-16 (×4): 5000 [IU] via SUBCUTANEOUS
  Filled 2014-12-14 (×4): qty 1

## 2014-12-14 MED ORDER — POTASSIUM CHLORIDE CRYS ER 20 MEQ PO TBCR
40.0000 meq | EXTENDED_RELEASE_TABLET | Freq: Once | ORAL | Status: DC
  Filled 2014-12-14 (×2): qty 2

## 2014-12-14 MED ORDER — ADULT MULTIVITAMIN W/MINERALS CH
1.0000 | ORAL_TABLET | Freq: Every day | ORAL | Status: DC
  Administered 2014-12-15: 1 via ORAL
  Filled 2014-12-14 (×2): qty 1

## 2014-12-14 MED ORDER — MAGNESIUM SULFATE 4 GM/100ML IV SOLN
4.0000 g | Freq: Once | INTRAVENOUS | Status: AC
  Administered 2014-12-15: 4 g via INTRAVENOUS

## 2014-12-14 MED ORDER — FOLIC ACID 1 MG PO TABS
1.0000 mg | ORAL_TABLET | Freq: Every day | ORAL | Status: DC
  Administered 2014-12-15 – 2014-12-16 (×2): 1 mg via ORAL
  Filled 2014-12-14 (×2): qty 1

## 2014-12-14 MED ORDER — POTASSIUM CHLORIDE CRYS ER 20 MEQ PO TBCR
40.0000 meq | EXTENDED_RELEASE_TABLET | Freq: Once | ORAL | Status: AC
Start: 2014-12-14 — End: 2014-12-15
  Administered 2014-12-15: 40 meq via ORAL

## 2014-12-14 MED ORDER — ACETAMINOPHEN 650 MG RE SUPP: 650.0000 mg | Freq: Four times a day (QID) | RECTAL | Status: DC | PRN

## 2014-12-14 MED ORDER — SODIUM CHLORIDE 0.9 % IJ SOLN
3.0000 mL | Freq: Two times a day (BID) | INTRAMUSCULAR | Status: DC
  Administered 2014-12-15 – 2014-12-16 (×2): 3 mL via INTRAVENOUS

## 2014-12-14 MED ORDER — ONDANSETRON HCL 4 MG PO TABS: 4.0000 mg | ORAL_TABLET | Freq: Four times a day (QID) | ORAL | Status: DC | PRN

## 2014-12-14 MED ORDER — VITAMIN B-1 100 MG PO TABS
100.0000 mg | ORAL_TABLET | Freq: Every day | ORAL | Status: DC
  Administered 2014-12-15 – 2014-12-16 (×2): 100 mg via ORAL
  Filled 2014-12-14 (×2): qty 1

## 2014-12-14 MED ORDER — VITAMIN B-1 100 MG PO TABS
500.0000 mg | ORAL_TABLET | Freq: Once | ORAL | Status: AC
  Administered 2014-12-15: 500 mg via ORAL
  Filled 2014-12-14: qty 5

## 2014-12-14 MED ORDER — MAGNESIUM SULFATE 2 GM/50ML IV SOLN
2.0000 g | Freq: Once | INTRAVENOUS | Status: DC
  Filled 2014-12-14: qty 50

## 2014-12-14 MED ORDER — POTASSIUM CHLORIDE IN NACL 20-0.9 MEQ/L-% IV SOLN
Freq: Once | INTRAVENOUS | Status: DC
  Filled 2014-12-14 (×2): qty 1000

## 2014-12-14 MED ORDER — ACETAMINOPHEN 325 MG PO TABS: 650.0000 mg | ORAL_TABLET | Freq: Four times a day (QID) | ORAL | Status: DC | PRN

## 2014-12-14 MED ORDER — LORAZEPAM 2 MG/ML IJ SOLN: 1.0000 mg | Freq: Four times a day (QID) | INTRAMUSCULAR | Status: DC | PRN

## 2014-12-14 MED ORDER — THIAMINE HCL 100 MG/ML IJ SOLN
100.0000 mg | Freq: Every day | INTRAMUSCULAR | Status: DC
  Filled 2014-12-14: qty 2

## 2014-12-14 MED ORDER — LORAZEPAM 1 MG PO TABS: 1.0000 mg | ORAL_TABLET | Freq: Four times a day (QID) | ORAL | Status: DC | PRN

## 2014-12-14 MED ORDER — FOLIC ACID 1 MG PO TABS
1.0000 mg | ORAL_TABLET | Freq: Once | ORAL | Status: AC
  Administered 2014-12-15: 1 mg via ORAL
  Filled 2014-12-14: qty 1

## 2014-12-14 NOTE — ED Provider Notes (Addendum)
[facility] Emergency Department Provider Note  Time seen: 9:24 PM  I have reviewed the triage vital signs and the nursing notes.   HISTORY  Chief Complaint Fatigue; Abdominal Pain; Generalized Body Aches; and Diarrhea    HPI Beth [name] is a 43 y.o. female with a past medical history fatty liver disease, arthritis, chronic back pain, hypertension presents the emergency department with abdominal pain. According to the patient she has been complaining of generalized body aches, nausea vomiting and occasional diarrhea for the past one week. She also states some right-sided abdominal pain. Denies fever, dysuria, vaginal discharge or bleeding. Patient describes her abdominal pain is moderate. She states a history of fatty liver disease, and will have right-sided abdominal pain at times but this feels worse. Patient states she is becoming nauseated after every meal for the last 1 week, but only occasionally vomits.     Past Medical History  Diagnosis Date  . Fatty liver   . Arthritis   . Chronic back pain     Patient Active Problem List   Diagnosis Date Noted  . Alcohol abuse 04/08/2013  . 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 Tobacco abuse 04/08/2013  . Cervical neuralgia 04/08/2013  . Occipital neuralgia 04/08/2013  . Gait difficulty 04/08/2013    Past Surgical History  Procedure Laterality Date  . Tubal ligation  1992  . Finger surgery    . Laparoscopic oopherectomy      Current Outpatient Rx  Name  Route  Sig  Dispense  Refill  . amitriptyline (ELAVIL) 25 MG tablet      take 2 tablets by mouth at bedtime (PLEASE SCHEDULE AN APPT WITH YOUR DR)   10 tablet   0     Please schedule appt.  Thank you.   Marland Kitchen amLODipine (NORVASC) 5 MG tablet   Oral   Take 5 mg by mouth daily.         . folic acid (FOLVITE) 1 MG tablet   Oral   Take 1 mg by mouth daily. 2 tabs  daily         . gabapentin (NEURONTIN) 800 MG tablet   Oral   Take 800 mg by mouth 3 (three) times daily.         Marland Kitchen omeprazole (PRILOSEC) 10 MG capsule   Oral   Take 10 mg by mouth daily.           Allergies Penicillins and Erythromycin  Family History  Problem Relation Age of Onset  . Cancer Mother   . Kidney failure Father   . Diabetes Father     Social History Social History  Substance Use Topics  . Smoking status: Current Every Day Smoker  . Smokeless tobacco: Never Used  . Alcohol Use: Yes     Comment: 3-4 drinks weekly    Review of Systems Constitutional: Negative for fever. Cardiovascular: Negative for chest pain. Respiratory: Negative for shortness of breath. Gastrointestinal: Positive for right-sided abdominal pain, nausea, vomiting, diarrhea. Genitourinary: Negative for dysuria. Musculoskeletal: Positive for chronic back pain. Neurological: Negative for headaches, new focal weakness or numbness, states a history of neuropathy. 10-point ROS otherwise negative.  ____________________________________________   PHYSICAL EXAM:  VITAL SIGNS: ED Triage Vitals  Enc Vitals Group     BP 12/14/14 2014 123/77 mmHg     Pulse Rate 12/14/14 2014 81     Resp 12/14/14 2014 18     Temp 12/14/14  2014 98.1 F (36.7 C)     Temp Source 12/14/14 2014 Oral     SpO2 12/14/14 2014 94 %     Weight 12/14/14 2014 185 lb (83.915 kg)     Height 12/14/14 2014 5\' 2"  (1.575 m)     Head Cir --      Peak Flow --      Pain Score 12/14/14 2015 6     Pain Loc --      Pain Edu? --      Excl. in [place]? --     Constitutional: Alert and oriented. Well appearing and in no distress. Eyes: Normal exam ENT   Mouth/Throat: Mucous membranes are moist. Cardiovascular: Normal rate, regular rhythm.  Respiratory: Normal respiratory effort without tachypnea nor retractions. Breath sounds are clear and equal bilaterally. No wheezes/rales/rhonchi. Gastrointestinal: Soft, moderate right  upper quadrant tenderness to palpation. Hepatomegaly palpated. No rebound or guarding. No distention. Otherwise benign abdomen. Musculoskeletal: Nontender with normal range of motion in all extremities. Neurologic:  Normal speech and language. No gross focal neurologic deficits Skin:  Skin is warm, dry and intact.  Psychiatric: Mood and affect are normal. Speech and behavior are normal. Patient exhibits appropriate insight and judgment.  ____________________________________________     RADIOLOGY  Fatty liver, no acute findings.  ____________________________________________    INITIAL IMPRESSION / ASSESSMENT AND PLAN / ED COURSE  Pertinent labs & imaging results that were available during my care of the patient were reviewed by me and considered in my medical decision making (see chart for details).  Patient with complaints of generalized bodyaches, occasional nausea and vomiting as well as occasional diarrhea for the past one week. Patient also complaining of right upper quadrant abdominal pain worse over the last 2-3 days. We will check labs, lipase, hepatic panel, and a right upper quadrant ultrasound to help further evaluate.  Patient's labs show a very low potassium level of 2.2. We'll add on a magnesium level to help further evaluate. In reviewing the patient's record it appears she had a similar admission back in 2013 for very low potassium, magnesium, and phosphate. Patient does admit daily alcohol use was probably contributes to her electrolyte abnormalities. But given her generalized weakness, nausea, vomiting, abdominal pain with her hypokalemia believe the patient requires IV repletion of her electrolytes before she can be safely managed at home. We will admit the patient for further workup, and management. Patient agreeable to plan.  EKG reviewed and interpreted by myself shows normal sinus rhythm at 70 bpm, narrow QRS, normal axis, normal intervals besides a prolonged QTC 537  ms. Nonspecific ST changes are present. No ST elevations noted.  ____________________________________________   FINAL CLINICAL IMPRESSION(S) / ED DIAGNOSES  Nausea/vomiting/diarrhea Right upper quadrant abdominal pain   Harvest Dark, MD 12/14/14 2230  Harvest Dark, MD 12/14/14 2235

## 2014-12-14 NOTE — ED Notes (Signed)
Patient presents with c/o generalized body aches, N/V, and non-specific abdominal pain. Patient reporting that she is weak. Denies fever and urinary symptoms.

## 2014-12-14 NOTE — H&P (Signed)
[facility] [facility] [place] NAME: Beth [name]    MR#:  [id number]  DATE OF BIRTH:  [birth year]   DATE OF ADMISSION:  12/14/2014  PRIMARY CARE PHYSICIAN: Enid Derry, MD   REQUESTING/REFERRING PHYSICIAN: Paduchowski  CHIEF COMPLAINT:   Chief Complaint  Patient presents with  . Fatigue  . Abdominal Pain  . Generalized Body Aches  . Diarrhea    HISTORY OF PRESENT ILLNESS:  Beth [name]  is a 44 y.o. female with a known history of alcohol abuse, fatty liver disease presenting with fatigue and abdominal pain. Describes one-week duration progressive generalized fatigue and weakness, associated abdominal pain lower suprapubic region cramping in quality 3-4/10 intensity and no worsening or relieving factors. Associated with nausea/vomiting/diarrhea nonbloody nonbilious emesis, loose watery stools. Denies any fevers, chills, further symptomatology at this time. Emergency department course noted to have remarkably low potassium and magnesium Patient states that she drinks alcohol daily, has never had withdrawal symptoms, last drink today, drinks 1/4 gallon of hard liquor daily.  PAST MEDICAL HISTORY:   Past Medical History  Diagnosis Date  . Fatty liver   . Arthritis   . Chronic back pain     PAST SURGICAL HISTORY:   Past Surgical History  Procedure Laterality Date  . Tubal ligation  1992  . Finger surgery    . Laparoscopic oopherectomy      SOCIAL HISTORY:   Social History  Substance Use Topics  . Smoking status: Current Every Day Smoker  . Smokeless tobacco: Never Used  . Alcohol Use: Yes     Comment: Daily    FAMILY HISTORY:   Family History  Problem Relation Age of Onset  . Cancer Mother   . Kidney failure Father   . Diabetes Father     DRUG ALLERGIES:   Allergies  Allergen Reactions  . Penicillins Hives  . Erythromycin Itching and Rash    REVIEW OF SYSTEMS:  REVIEW OF SYSTEMS:  CONSTITUTIONAL: Denies  fevers, chills, positive fatigue, weakness.  EYES: Denies blurred vision, double vision, or eye pain.  EARS, NOSE, THROAT: Denies tinnitus, ear pain, hearing loss.  RESPIRATORY: denies cough, shortness of breath, wheezing  CARDIOVASCULAR: Denies chest pain, palpitations, edema.  GASTROINTESTINAL: Positive nausea, vomiting, diarrhea, abdominal pain.  GENITOURINARY: Denies dysuria, hematuria.  ENDOCRINE: Denies nocturia or thyroid problems. HEMATOLOGIC AND LYMPHATIC: Denies easy bruising or bleeding.  SKIN: Denies rash or lesions.  MUSCULOSKELETAL: Denies pain in neck, back, shoulder, knees, hips, or further arthritic symptoms. Positive cramping in legs NEUROLOGIC: Denies paralysis, paresthesias.  PSYCHIATRIC: Denies anxiety or depressive symptoms. Otherwise full review of systems performed by me is negative.   MEDICATIONS AT HOME:   Prior to Admission medications   Medication Sig Start Date End Date Taking? Authorizing Provider  amitriptyline (ELAVIL) 25 MG tablet take 2 tablets by mouth at bedtime (PLEASE SCHEDULE AN APPT WITH YOUR DR) 11/15/14  Yes Penni Bombard, MD  amLODipine (NORVASC) 5 MG tablet Take 5 mg by mouth daily.   Yes Historical Provider, MD  gabapentin (NEURONTIN) 800 MG tablet Take 800 mg by mouth 3 (three) times daily.   Yes Historical Provider, MD  omeprazole (PRILOSEC) 10 MG capsule Take 10 mg by mouth daily.   Yes Historical Provider, MD      VITAL SIGNS:  Blood pressure 113/74, pulse 78, temperature 98.1 F (36.7 C), temperature source Oral, resp. rate 18, height 5\' 2"  (1.575 m), weight 185 lb (83.915 kg), SpO2 91 %.  PHYSICAL EXAMINATION:  VITAL SIGNS: Filed Vitals:   12/14/14 2254  BP: 113/74  Pulse: 78  Temp:   Resp: 18   GENERAL:44 y.o.female currently in no acute distress.  HEAD: Normocephalic, atraumatic.  EYES: Pupils equal, round, reactive to light. Extraocular muscles intact. No scleral icterus.  MOUTH: Moist mucosal membrane. Dentition  intact. No abscess noted.  EAR, NOSE, THROAT: Clear without exudates. No external lesions.  NECK: Supple. No thyromegaly. No nodules. No JVD.  PULMONARY: Clear to ascultation, without wheeze rails or rhonci. No use of accessory muscles, Good respiratory effort. good air entry bilaterally CHEST: Nontender to palpation.  CARDIOVASCULAR: S1 and S2. Regular rate and rhythm. No murmurs, rubs, or gallops. No edema. Pedal pulses 2+ bilaterally.  GASTROINTESTINAL: Soft, nontender, nondistended. No masses. Positive bowel sounds. No hepatosplenomegaly.  MUSCULOSKELETAL: No swelling, clubbing, or edema. Range of motion full in all extremities.  NEUROLOGIC: Cranial nerves II through XII are intact. No gross focal neurological deficits. Sensation intact. Reflexes intact.  SKIN: No ulceration, lesions, rashes, or cyanosis. Skin warm and dry. Turgor intact.  PSYCHIATRIC: Mood, affect within normal limits. The patient is awake, alert and oriented x 3. Insight, judgment intact.    LABORATORY PANEL:   CBC  Recent Labs Lab 12/14/14 2037  WBC 11.1*  HGB 14.1  HCT 41.5  PLT 301   ------------------------------------------------------------------------------------------------------------------  Chemistries   Recent Labs Lab 12/14/14 2037  NA 138  K 2.2*  CL 88*  CO2 33*  GLUCOSE 128*  BUN <5*  CREATININE 0.80  CALCIUM 6.7*  MG 1.0*  AST 112*  ALT 30  ALKPHOS 152*  BILITOT 0.5   ------------------------------------------------------------------------------------------------------------------  Cardiac Enzymes No results for input(s): TROPONINI in the last 168 hours. ------------------------------------------------------------------------------------------------------------------  RADIOLOGY:  US Abdomen Limited Ruq  12/14/2014   CLINICAL DATA:  Right upper quadrant pain for 3-4 days, history of cholelithiasis  EXAM: US ABDOMEN LIMITED - RIGHT UPPER QUADRANT  COMPARISON:  05/18/2013   FINDINGS: Gallbladder:  Multiple tiny mobile calculi the largest measuring 7 mm. No Murphy's sign or wall thickening.  Common bile duct:  Diameter: 4 mm.  Liver:  Liver appears enlarged with diffusely coarsened echotexture. Limited evaluation due to diffusely abnormal echotexture such that subtle focal hepatic abnormalities are difficult to exclude.  IMPRESSION: Diffuse hepatic parenchymal disease likely steatosis. Focal hepatic abnormalities not excluded as a result.  Cholelithiasis.   Electronically Signed   By: Skipper Cliche M.D.   On: 12/14/2014 22:13    EKG:   Orders placed or performed during the hospital encounter of 12/14/14  . ED EKG  . ED EKG    IMPRESSION AND PLAN:   44 year old Caucasian female history of alcohol abuse and fatty liver disease presenting with nausea/vomiting/diarrhea/abdominal pain  1. Hypokalemia: Replace potassium to goal4-5 2. Hypomagnesemia: Replace magnesium goal 2, check phosphorus 3. Alcohol abuse: Suspected element of gastritis, and a PPI therapy, CIWA protocol 4. Essential hypertension: Norvasc 5. Venous thromboembolism prophylactic: Heparin subcutaneous    All the records are reviewed and case discussed with ED provider. Management plans discussed with the patient, family and they are in agreement.  CODE STATUS: Full  TOTAL TIME TAKING CARE OF THIS PATIENT: 35 minutes.    Shalece Staffa,  Karenann Cai.D on 12/14/2014 at 11:22 PM  Between 7am to 6pm - Pager - [phone]  After 6pm: House Pager: - [place] [facility]  Office  [phone]  CC: Primary care physician; Enid Derry, MD

## 2014-12-14 NOTE — Telephone Encounter (Signed)
No answer, unable to leave a message. °

## 2014-12-15 LAB — BASIC METABOLIC PANEL
Anion gap: 12 (ref 5–15)
BUN: <5 mg/dL — ABNORMAL LOW (ref 6–20)
CALCIUM: 6.4 mg/dL — AB (ref 8.9–10.3)
CHLORIDE: 93 mmol/L — AB (ref 101–111)
CO2: 35 mmol/L — ABNORMAL HIGH (ref 22–32)
CREATININE: 0.73 mg/dL (ref 0.44–1.00)
Glucose, Bld: 106 mg/dL — ABNORMAL HIGH (ref 65–99)
Potassium: 2.6 mmol/L — CL (ref 3.5–5.1)
SODIUM: 140 mmol/L (ref 135–145)

## 2014-12-15 LAB — CBC
HCT: 38.8 % (ref 35.0–47.0)
Hemoglobin: 13.1 g/dL (ref 12.0–16.0)
MCH: 37.2 pg — ABNORMAL HIGH (ref 26.0–34.0)
MCHC: 33.7 g/dL (ref 32.0–36.0)
MCV: 110.3 fL — ABNORMAL HIGH (ref 80.0–100.0)
PLATELETS: 285 10*3/uL (ref 150–440)
RBC: 3.51 MIL/uL — AB (ref 3.80–5.20)
RDW: 16.1 % — AB (ref 11.5–14.5)
WBC: 10.4 10*3/uL (ref 3.6–11.0)

## 2014-12-15 LAB — PHOSPHORUS: PHOSPHORUS: 4.3 mg/dL (ref 2.5–4.6)

## 2014-12-15 MED ORDER — POTASSIUM CHLORIDE 10 MEQ/100ML IV SOLN
10.0000 meq | INTRAVENOUS | Status: AC
  Administered 2014-12-15 (×4): 10 meq via INTRAVENOUS
  Filled 2014-12-15 (×4): qty 100

## 2014-12-15 MED ORDER — PANTOPRAZOLE SODIUM 40 MG PO TBEC
40.0000 mg | DELAYED_RELEASE_TABLET | Freq: Every day | ORAL | Status: DC
  Administered 2014-12-15 – 2014-12-16 (×2): 40 mg via ORAL
  Filled 2014-12-15 (×2): qty 1

## 2014-12-15 MED ORDER — AMITRIPTYLINE HCL 25 MG PO TABS
25.0000 mg | ORAL_TABLET | Freq: Every day | ORAL | Status: DC
  Administered 2014-12-15: 25 mg via ORAL
  Filled 2014-12-15: qty 1

## 2014-12-15 MED ORDER — AMLODIPINE BESYLATE 5 MG PO TABS
5.0000 mg | ORAL_TABLET | Freq: Every day | ORAL | Status: DC
  Administered 2014-12-15 – 2014-12-16 (×2): 5 mg via ORAL
  Filled 2014-12-15 (×2): qty 1

## 2014-12-15 MED ORDER — NICOTINE 10 MG IN INHA
1.0000 | RESPIRATORY_TRACT | Status: DC | PRN
  Administered 2014-12-15: 1 via RESPIRATORY_TRACT
  Filled 2014-12-15: qty 36

## 2014-12-15 MED ORDER — GABAPENTIN 400 MG PO CAPS
800.0000 mg | ORAL_CAPSULE | Freq: Three times a day (TID) | ORAL | Status: DC
  Administered 2014-12-15 – 2014-12-16 (×4): 800 mg via ORAL
  Filled 2014-12-15 (×7): qty 2

## 2014-12-15 MED ORDER — OXYCODONE HCL 5 MG PO TABS
ORAL_TABLET | ORAL | Status: AC
  Filled 2014-12-15: qty 1

## 2014-12-15 MED ORDER — MAGNESIUM SULFATE 4 GM/100ML IV SOLN
INTRAVENOUS | Status: AC
  Filled 2014-12-15: qty 100

## 2014-12-15 NOTE — Plan of Care (Signed)
Problem: Discharge Progression Outcomes Goal: Other Discharge Outcomes/Goals Outcome: Progressing Pt admitted from the ED last night with chief complaints of abdominal pain, mild N/V/D. Potassium level was 2.2 on admission with a Magnesium level of 1.0. Potassium level improved to just 2.6 this am and Calcium level was 6.4. Dr Marcille Blanco notified of critical results. No new orders given, referred to am rounding MD. Pt negative on CIWA scale. Verbalized that she has been drinking liquor x 15 years.  Up without difficulty. Low abdominal pain rated around a 4/10 which patient stated is her goal.

## 2014-12-15 NOTE — Progress Notes (Signed)
[place] [facility] [place] NAME: Beth [name]    MR#:  [id number]  DATE OF BIRTH:  [birth year]  SUBJECTIVE: Admitted for nausea vomiting diarrhea. Found to have severe hypokalemia. Patient denies nausea or vomiting or diarrhea at this time. Tolerating liquid diet well. Denies any other complaints. Last drink was Monday. No withdrawal symptoms no shaking no hallucinations.   CHIEF COMPLAINT:   Chief Complaint  Patient presents with  . Fatigue  . Abdominal Pain  . Generalized Body Aches  . Diarrhea    REVIEW OF SYSTEMS:    ROS  Nutrition: Tolerating Diet: Tolerating PT:      DRUG ALLERGIES:   Allergies  Allergen Reactions  . Penicillins Hives  . Erythromycin Itching and Rash    VITALS:  Blood pressure 112/61, pulse 78, temperature 98.6 F (37 C), temperature source Oral, resp. rate 22, height 5\' 2"  (1.575 m), weight 86.773 kg (191 lb 4.8 oz), SpO2 92 %.  PHYSICAL EXAMINATION:   Physical Exam  GENERAL:  44 y.o.-year-old patient lying in the bed with no acute distress.  EYES: Pupils equal, round, reactive to light and accommodation. No scleral icterus. Extraocular muscles intact.  HEENT: Head atraumatic, normocephalic. Oropharynx and nasopharynx clear.  NECK:  Supple, no jugular venous distention. No thyroid enlargement, no tenderness.  LUNGS: Normal breath sounds bilaterally, no wheezing, rales,rhonchi or crepitation. No use of accessory muscles of respiration.  CARDIOVASCULAR: S1, S2 normal. No murmurs, rubs, or gallops.  ABDOMEN: Soft, nontender, nondistended. Bowel sounds present. No organomegaly or mass.  EXTREMITIES: No pedal edema, cyanosis, or clubbing.  NEUROLOGIC: Cranial nerves II through XII are intact. Muscle strength 5/5 in all extremities. Sensation intact. Gait not checked.  PSYCHIATRIC: The patient is alert and oriented x 3.  SKIN: No obvious rash, lesion, or ulcer.    LABORATORY PANEL:   CBC  Recent  Labs Lab 12/15/14 0539  WBC 10.4  HGB 13.1  HCT 38.8  PLT 285   ------------------------------------------------------------------------------------------------------------------  Chemistries   Recent Labs Lab 12/14/14 2037 12/15/14 0539  NA 138 140  K 2.2* 2.6*  CL 88* 93*  CO2 33* 35*  GLUCOSE 128* 106*  BUN <5* <5*  CREATININE 0.80 0.73  CALCIUM 6.7* 6.4*  MG 1.0*  --   AST 112*  --   ALT 30  --   ALKPHOS 152*  --   BILITOT 0.5  --    ------------------------------------------------------------------------------------------------------------------  Cardiac Enzymes No results for input(s): TROPONINI in the last 168 hours. ------------------------------------------------------------------------------------------------------------------  RADIOLOGY:  US Abdomen Limited Ruq  12/14/2014   CLINICAL DATA:  Right upper quadrant pain for 3-4 days, history of cholelithiasis  EXAM: US ABDOMEN LIMITED - RIGHT UPPER QUADRANT  COMPARISON:  05/18/2013  FINDINGS: Gallbladder:  Multiple tiny mobile calculi the largest measuring 7 mm. No Murphy's sign or wall thickening.  Common bile duct:  Diameter: 4 mm.  Liver:  Liver appears enlarged with diffusely coarsened echotexture. Limited evaluation due to diffusely abnormal echotexture such that subtle focal hepatic abnormalities are difficult to exclude.  IMPRESSION: Diffuse hepatic parenchymal disease likely steatosis. Focal hepatic abnormalities not excluded as a result.  Cholelithiasis.   Electronically Signed   By: Skipper Cliche M.D.   On: 12/14/2014 22:13     ASSESSMENT AND PLAN:   Active Problems:   Hypokalemia   Hypomagnesemia  Her nausea vomiting diarrhea likely viral illness. Resolved symptoms. #2 hypokalemia secondary to #1 symptoms are improving and hypokalemia and hypomagnesemia being  replaced. Recheck again.  ETOH Abuse; patient possibly has gastritis continue PPIs continue CIWA protocol. No withdrawal symptoms at this  time.     All the records are reviewed and case discussed with Care Management/Social Workerr. Management plans discussed with the patient, family and they are in agreement.  CODE STATUS:full  TOTAL TIME TAKING CARE OF THIS PATIENT:35 minutes.   POSSIBLE D/C IN 1-2DAYS, DEPENDING ON CLINICAL CONDITION.   Epifanio Lesches M.D on 12/15/2014 at 11:41 AM  Between 7am to 6pm - Pager - [phone]  After 6pm go to [email or web address] - password [email or web address] [place] [facility]  Office  [phone]  CC: Primary care physician; Enid Derry, MD

## 2014-12-15 NOTE — Care Management (Signed)
Admitted to this facility with the diagnosis of hypokalemia. Potassium = 2.2 on admission.  Reported that Ms. Worthley lives with her sister. Kelle Darting is family member/friend? Sees Dr. Sanda Klein ar [facility]. [employer] is listed as primary insurance with Medicaid pending. CIWA protocol in place. Shelbie Ammons RN MSN Care Management [phone]

## 2014-12-15 NOTE — Progress Notes (Signed)
Lab called with critical results this am. Potassium of 2.6 and Calcium of 6.4. Spoke with Dr Marcille Blanco. Pt has Potassium in her maintenance fluids. No new orders given.Jeffie Pollock, RN

## 2014-12-15 NOTE — Plan of Care (Signed)
Problem: Discharge Progression Outcomes Goal: Complications resolved/controlled Outcome: Progressing Pt is alert and oriented x 4, remains afebrile, vital signs stable, up to bathroom independently, c/o diabetic neuropathies treated with schedulded gabapentin, diet advanced, good appetite, denies n/v, potassium serum low at 2.6 receiving IV potassium replacement given slowly due to burning sensation during administration, on room air, reports 2 bm throughout shift, WBC normalized, patient receiving vitamin supplements, on ciwa scale q6h, uneventful shift.

## 2014-12-16 LAB — BASIC METABOLIC PANEL
ANION GAP: 13 (ref 5–15)
BUN: <5 mg/dL — ABNORMAL LOW (ref 6–20)
CALCIUM: 6.6 mg/dL — AB (ref 8.9–10.3)
CO2: 29 mmol/L (ref 22–32)
Chloride: 99 mmol/L — ABNORMAL LOW (ref 101–111)
Creatinine, Ser: 0.76 mg/dL (ref 0.44–1.00)
GFR calc Af Amer: >60 mL/min (ref >60)
Glucose, Bld: 96 mg/dL (ref 65–99)
POTASSIUM: 3.2 mmol/L — AB (ref 3.5–5.1)
SODIUM: 141 mmol/L (ref 135–145)

## 2014-12-16 MED ORDER — POTASSIUM CHLORIDE ER 10 MEQ PO TBCR: 10.0000 meq | EXTENDED_RELEASE_TABLET | Freq: Every day | ORAL | Status: DC

## 2014-12-16 NOTE — Discharge Instructions (Signed)
Hypokalemia Hypokalemia means that the amount of potassium in the blood is lower than normal.Potassium is a chemical, called an electrolyte, that helps regulate the amount of fluid in the body. It also stimulates muscle contraction and helps nerves function properly.Most of the body's potassium is inside of cells, and only a very small amount is in the blood. Because the amount in the blood is so small, minor changes can be life-threatening. CAUSES  Antibiotics.  Diarrhea or vomiting.  Using laxatives too much, which can cause diarrhea.  Chronic kidney disease.  Water pills (diuretics).  Eating disorders (bulimia).  Low magnesium level.  Sweating a lot. SIGNS AND SYMPTOMS  Weakness.  Constipation.  Fatigue.  Muscle cramps.  Mental confusion.  Skipped heartbeats or irregular heartbeat (palpitations).  Tingling or numbness. DIAGNOSIS  Your health care provider can diagnose hypokalemia with blood tests. In addition to checking your potassium level, your health care provider may also check other lab tests. TREATMENT Hypokalemia can be treated with potassium supplements taken by mouth or adjustments in your current medicines. If your potassium level is very low, you may need to get potassium through a vein (IV) and be monitored in the hospital. A diet high in potassium is also helpful. Foods high in potassium are:  Nuts, such as peanuts and pistachios.  Seeds, such as sunflower seeds and pumpkin seeds.  Peas, lentils, and lima beans.  Whole grain and bran cereals and breads.  Fresh fruit and vegetables, such as apricots, avocado, bananas, cantaloupe, kiwi, oranges, tomatoes, asparagus, and potatoes.  Orange and tomato juices.  Red meats.  Fruit yogurt. HOME CARE INSTRUCTIONS  Take all medicines as prescribed by your health care provider.  Maintain a healthy diet by including nutritious food, such as fruits, vegetables, nuts, whole grains, and lean meats.  If  you are taking a laxative, be sure to follow the directions on the label. SEEK MEDICAL CARE IF:  Your weakness gets worse.  You feel your heart pounding or racing.  You are vomiting or having diarrhea.  You are diabetic and having trouble keeping your blood glucose in the normal range. SEEK IMMEDIATE MEDICAL CARE IF:  You have chest pain, shortness of breath, or dizziness.  You are vomiting or having diarrhea for more than 2 days.  You faint. MAKE SURE YOU:   Understand these instructions.  Will watch your condition.  Will get help right away if you are not doing well or get worse. Document Released: 04/16/2005 Document Revised: 02/04/2013 Document Reviewed: 10/17/2012 [facility] Patient Information 2015 [place], [place]. This information is not intended to replace advice given to you by your health care provider. Make sure you discuss any questions you have with your health care provider.  Potassium Content of Foods Potassium is a mineral found in many foods and drinks. It helps keep fluids and minerals balanced in your body and affects how steadily your heart beats. Potassium also helps control your blood pressure and keep your muscles and nervous system healthy. Certain health conditions and medicines may change the balance of potassium in your body. When this happens, you can help balance your level of potassium through the foods that you do or do not eat. Your health care provider or dietitian may recommend an amount of potassium that you should have each day. The following lists of foods provide the amount of potassium (in parentheses) per serving in each item. HIGH IN POTASSIUM  The following foods and beverages have 200 mg or more of potassium per  serving:  Apricots, 2 raw or 5 dry (200 mg).  Artichoke, 1 medium (345 mg).  Avocado, raw,  each (245 mg).  Banana, 1 medium (425 mg).  Beans, lima, or baked beans, canned,  cup (280 mg).  Beans, white, canned,  cup (595  mg).  Beef roast, 3 oz (320 mg).  Beef, ground, 3 oz (270 mg).  Beets, raw or cooked,  cup (260 mg).  Bran muffin, 2 oz (300 mg).  Broccoli,  cup (230 mg).  Brussels sprouts,  cup (250 mg).  Cantaloupe,  cup (215 mg).  Cereal, 100% bran,  cup (200-400 mg).  Cheeseburger, single, fast food, 1 each (225-400 mg).  Chicken, 3 oz (220 mg).  Clams, canned, 3 oz (535 mg).  Crab, 3 oz (225 mg).  Dates, 5 each (270 mg).  Dried beans and peas,  cup (300-475 mg).  Figs, dried, 2 each (260 mg).  Fish: halibut, tuna, cod, snapper, 3 oz (480 mg).  Fish: salmon, haddock, swordfish, perch, 3 oz (300 mg).  Fish, tuna, canned 3 oz (200 mg).  [place] fries, fast food, 3 oz (470 mg).  Granola with fruit and nuts,  cup (200 mg).  Grapefruit juice,  cup (200 mg).  Greens, beet,  cup (655 mg).  Honeydew melon,  cup (200 mg).  Kale, raw, 1 cup (300 mg).  Kiwi, 1 medium (240 mg).  Kohlrabi, rutabaga, parsnips,  cup (280 mg).  Lentils,  cup (365 mg).  Mango, 1 each (325 mg).  Milk, chocolate, 1 cup (420 mg).  Milk: nonfat, low-fat, whole, buttermilk, 1 cup (350-380 mg).  Molasses, 1 Tbsp (295 mg).  Mushrooms,  cup (280) mg.  Nectarine, 1 each (275 mg).  Nuts: almonds, peanuts, hazelnuts, [place], cashew, mixed, 1 oz (200 mg).  Nuts, pistachios, 1 oz (295 mg).  Orange, 1 each (240 mg).  Orange juice,  cup (235 mg).  Papaya, medium,  fruit (390 mg).  Peanut butter, chunky, 2 Tbsp (240 mg).  Peanut butter, smooth, 2 Tbsp (210 mg).  Pear, 1 medium (200 mg).  Pomegranate, 1 whole (400 mg).  Pomegranate juice,  cup (215 mg).  Pork, 3 oz (350 mg).  Potato chips, salted, 1 oz (465 mg).  Potato, baked with skin, 1 medium (925 mg).  Potatoes, boiled,  cup (255 mg).  Potatoes, mashed,  cup (330 mg).  Prune juice,  cup (370 mg).  Prunes, 5 each (305 mg).  Pudding, chocolate,  cup (230 mg).  Pumpkin, canned,  cup (250  mg).  Raisins, seedless,  cup (270 mg).  Seeds, sunflower or pumpkin, 1 oz (240 mg).  Soy milk, 1 cup (300 mg).  Spinach,  cup (420 mg).  Spinach, canned,  cup (370 mg).  Sweet potato, baked with skin, 1 medium (450 mg).  Swiss chard,  cup (480 mg).  Tomato or vegetable juice,  cup (275 mg).  Tomato sauce or puree,  cup (400-550 mg).  Tomato, raw, 1 medium (290 mg).  Tomatoes, canned,  cup (200-300 mg).  [place], 3 oz (250 mg).  Wheat germ, 1 oz (250 mg).  Winter squash,  cup (250 mg).  Yogurt, plain or fruited, 6 oz (260-435 mg).  Zucchini,  cup (220 mg). MODERATE IN POTASSIUM The following foods and beverages have 50-200 mg of potassium per serving:  Apple, 1 each (150 mg).  Apple juice,  cup (150 mg).  Applesauce,  cup (90 mg).  Apricot nectar,  cup (140 mg).  Asparagus, small spears,  cup or 6  spears (155 mg).  Bagel, cinnamon raisin, 1 each (130 mg).  Bagel, egg or plain, 4 in., 1 each (70 mg).  Beans, green,  cup (90 mg).  Beans, yellow,  cup (190 mg).  Beer, regular, 12 oz (100 mg).  Beets, canned,  cup (125 mg).  Blackberries,  cup (115 mg).  Blueberries,  cup (60 mg).  Bread, whole wheat, 1 slice (70 mg).  Broccoli, raw,  cup (145 mg).  Cabbage,  cup (150 mg).  Carrots, cooked or raw,  cup (180 mg).  Cauliflower, raw,  cup (150 mg).  Celery, raw,  cup (155 mg).  Cereal, bran flakes, cup (120-150 mg).  Cheese, cottage,  cup (110 mg).  Cherries, 10 each (150 mg).  Chocolate, 1 oz bar (165 mg).  Coffee, brewed 6 oz (90 mg).  Corn,  cup or 1 ear (195 mg).  Cucumbers,  cup (80 mg).  Egg, large, 1 each (60 mg).  Eggplant,  cup (60 mg).  Endive, raw, cup (80 mg).  English muffin, 1 each (65 mg).  Fish, orange roughy, 3 oz (150 mg).  Frankfurter, beef or pork, 1 each (75 mg).  Fruit cocktail,  cup (115 mg).  Grape juice,  cup (170 mg).  Grapefruit,  fruit (175 mg).  Grapes,  cup  (155 mg).  Greens: kale, turnip, collard,  cup (110-150 mg).  Ice cream or frozen yogurt, chocolate,  cup (175 mg).  Ice cream or frozen yogurt, vanilla,  cup (120-150 mg).  Lemons, limes, 1 each (80 mg).  Lettuce, all types, 1 cup (100 mg).  Mixed vegetables,  cup (150 mg).  Mushrooms, raw,  cup (110 mg).  Nuts: walnuts, pecans, or macadamia, 1 oz (125 mg).  Oatmeal,  cup (80 mg).  Okra,  cup (110 mg).  Onions, raw,  cup (120 mg).  Peach, 1 each (185 mg).  Peaches, canned,  cup (120 mg).  Pears, canned,  cup (120 mg).  Peas, green, frozen,  cup (90 mg).  Peppers, green,  cup (130 mg).  Peppers, red,  cup (160 mg).  Pineapple juice,  cup (165 mg).  Pineapple, fresh or canned,  cup (100 mg).  Plums, 1 each (105 mg).  Pudding, vanilla,  cup (150 mg).  Raspberries,  cup (90 mg).  Rhubarb,  cup (115 mg).  Rice, wild,  cup (80 mg).  Shrimp, 3 oz (155 mg).  Spinach, raw, 1 cup (170 mg).  Strawberries,  cup (125 mg).  Summer squash  cup (175-200 mg).  Swiss chard, raw, 1 cup (135 mg).  Tangerines, 1 each (140 mg).  Tea, brewed, 6 oz (65 mg).  Turnips,  cup (140 mg).  Watermelon,  cup (85 mg).  Wine, red, table, 5 oz (180 mg).  Wine, white, table, 5 oz (100 mg). LOW IN POTASSIUM The following foods and beverages have less than 50 mg of potassium per serving.  Bread, white, 1 slice (30 mg).  Carbonated beverages, 12 oz (less than 5 mg).  Cheese, 1 oz (20-30 mg).  Cranberries,  cup (45 mg).  Cranberry juice cocktail,  cup (20 mg).  Fats and oils, 1 Tbsp (less than 5 mg).  Hummus, 1 Tbsp (32 mg).  Nectar: papaya, mango, or pear,  cup (35 mg).  Rice, white or brown,  cup (50 mg).  Spaghetti or macaroni,  cup cooked (30 mg).  Tortilla, flour or corn, 1 each (50 mg).  Waffle, 4 in., 1 each (50 mg).  Water chestnuts,  cup (40 mg).  Document Released: 11/28/2004 Document Revised: 04/21/2013 Document  Reviewed: 03/13/2013 [facility] Patient Information 2015 [place], [place]. This information is not intended to replace advice given to you by your health care provider. Make sure you discuss any questions you have with your health care provider.

## 2014-12-16 NOTE — Progress Notes (Signed)
Discharge information reviewed and explained to patient. Pt shows understanding, questions answered. Pt family at bedside to transport home.

## 2014-12-16 NOTE — Plan of Care (Signed)
Problem: Discharge Progression Outcomes Goal: Other Discharge Outcomes/Goals Outcome: Progressing Pt with no c/o pain. Took scheduled dose of Gabapentin. Amitriptyline at bedtime as well. Negative on the CIWA scale. Up ad lib. Pt was hyponatremic yesterday. Am labs still pending for this am. Eating and drinking well.

## 2014-12-17 NOTE — Discharge Summary (Signed)
Beth [name], is a 44 y.o. female  DOB [birth year]  MRN [id number].  Admission date:  12/14/2014  Admitting Physician  Lytle Butte, MD  Discharge Date:  12/16/2014   Primary MD  Enid Derry, MD  Recommendations for primary care physician for things to follow:   Follow l up with primary doctor in 1 week.   Admission Diagnosis  Hypokalemia [E87.6] Generalized weakness [R53.1] Non-intractable vomiting with nausea, vomiting of unspecified type [R11.2]   Discharge Diagnosis  Hypokalemia [E87.6] Generalized weakness [R53.1] Non-intractable vomiting with nausea, vomiting of unspecified type [R11.2]    Active Problems:   Hypokalemia   Hypomagnesemia      Past Medical History  Diagnosis Date  . Fatty liver   . Arthritis   . Chronic back pain     Past Surgical History  Procedure Laterality Date  . Tubal ligation  1992  . Finger surgery    . Laparoscopic oopherectomy         History of present illness and  Hospital Course:     Kindly see H&P for history of present illness and admission details, please review complete Labs, Consult reports and Test reports for all details in brief  HPI  from the history and physical done on the day of admission   44 year old female patient admitted for nausea vomiting diarrhea found to have severe hypokalemia. Admitted for the hypokalemia/gastroenteritis-  Hospital Course    #1 hypokalemia secondary to gastroenteritis symptoms improved with IV hydration and potassium supplementation.potasium improved from 2.2 to 3.2. Home with potassium. 2.nausea/vomiting;improved with fluids. 3.h/o the EtOH abuse: Patient says that she is cutting down  ALCOHOL,did not have any withdrawal symptoms here . #4 hyper tension controlled   hypomagnesemia replaced.   Discharge Condition:     Follow UP  Follow-up Information    Follow up with Enid Derry, MD On 12/27/2014.   Specialty:  Family Medicine   Why:  Appointment scheduled @ 10:30   Contact information:   [place] [address] [phone]         Discharge Instructions  and  Discharge Medicat     Medication List    TAKE these medications        amitriptyline 25 MG tablet  Commonly known as:  ELAVIL  take 2 tablets by mouth at bedtime (PLEASE SCHEDULE AN APPT WITH YOUR DR)     amLODipine 5 MG tablet  Commonly known as:  NORVASC  Take 5 mg by mouth daily.     gabapentin 800 MG tablet  Commonly known as:  NEURONTIN  Take 800 mg by mouth 3 (three) times daily.     omeprazole 10 MG capsule  Commonly known as:  PRILOSEC  Take 10 mg by mouth daily.     potassium chloride 10 MEQ tablet  Commonly known as:  K-DUR  Take 1 tablet (10 mEq total) by mouth daily.          Diet and Activity recommendation: See Discharge Instructions above   Consults obtained - NONE   Major procedures and Radiology Reports - PLEASE review detailed and final reports for all details, in brief -      US Abdomen Limited Ruq  12/14/2014   CLINICAL DATA:  Right upper quadrant pain for 3-4 days, history of cholelithiasis  EXAM: US ABDOMEN LIMITED - RIGHT UPPER QUADRANT  COMPARISON:  05/18/2013  FINDINGS: Gallbladder:  Multiple tiny mobile calculi the largest measuring 7 mm. No Murphy's sign  or wall thickening.  Common bile duct:  Diameter: 4 mm.  Liver:  Liver appears enlarged with diffusely coarsened echotexture. Limited evaluation due to diffusely abnormal echotexture such that subtle focal hepatic abnormalities are difficult to exclude.  IMPRESSION: Diffuse hepatic parenchymal disease likely steatosis. Focal hepatic abnormalities not excluded as a result.  Cholelithiasis.   Electronically Signed   By: Skipper Cliche M.D.   On: 12/14/2014 22:13    Micro Results     No results found for this or any  previous visit (from the past 240 hour(s)).     Today   Subjective:   Beth [name] today has no headache,no chest abdominal pain,no new weakness tingling or numbness, feels much better wants to go home today.   Objective:   Blood pressure 109/68, pulse 81, temperature 98.2 F (36.8 C), temperature source Oral, resp. rate 20, height 5\' 2"  (1.575 m), weight 86.773 kg (191 lb 4.8 oz), SpO2 100 %.  No intake or output data in the 24 hours ending 12/17/14 1225  Exam Awake Alert, Oriented x 3, No new F.N deficits, Normal affect [place].AT,PERRAL Supple Neck,No JVD, No cervical lymphadenopathy appriciated.  Symmetrical Chest wall movement, Good air movement bilaterally, CTAB RRR,No Gallops,Rubs or new Murmurs, No Parasternal Heave +ve B.Sounds, Abd Soft, Non tender, No organomegaly appriciated, No rebound -guarding or rigidity. No Cyanosis, Clubbing or edema, No new Rash or bruise  Data Review   CBC w Diff:  Lab Results  Component Value Date   WBC 10.4 12/15/2014   WBC 12.4* 01/18/2014   HGB 13.1 12/15/2014   HGB 15.7 01/18/2014   HCT 38.8 12/15/2014   HCT 48.0* 01/18/2014   PLT 285 12/15/2014   PLT 221 01/18/2014   LYMPHOPCT 26.4 01/18/2014   MONOPCT 5.2 01/18/2014   EOSPCT 1.7 01/18/2014   BASOPCT 1.3 01/18/2014    CMP:  Lab Results  Component Value Date   NA 141 12/16/2014   NA 141 02/05/2012   K 3.2* 12/16/2014   K 4.2 02/05/2012   CL 99* 12/16/2014   CL 107 02/05/2012   CO2 29 12/16/2014   CO2 26 02/05/2012   BUN <5* 12/16/2014   BUN 10 02/05/2012   CREATININE 0.76 12/16/2014   CREATININE 1.05 02/05/2012   PROT 7.1 12/14/2014   PROT 6.5 02/05/2012   ALBUMIN 3.6 12/14/2014   ALBUMIN 2.9* 02/05/2012   BILITOT 0.5 12/14/2014   BILITOT 0.2 02/05/2012   ALKPHOS 152* 12/14/2014   ALKPHOS 239* 02/05/2012   AST 112* 12/14/2014   AST 98* 02/05/2012   ALT 30 12/14/2014   ALT 43 02/05/2012  .   Total Time in preparing paper work, data evaluation and todays  exam - 38 minutes  Shavonna Corella M.D on [phone] at 12:25 PM

## 2014-12-21 DIAGNOSIS — F1021 Alcohol dependence, in remission: Secondary | ICD-10-CM | POA: Insufficient documentation

## 2014-12-21 DIAGNOSIS — F4323 Adjustment disorder with mixed anxiety and depressed mood: Secondary | ICD-10-CM | POA: Insufficient documentation

## 2014-12-21 DIAGNOSIS — J45909 Unspecified asthma, uncomplicated: Secondary | ICD-10-CM | POA: Insufficient documentation

## 2014-12-21 DIAGNOSIS — J449 Chronic obstructive pulmonary disease, unspecified: Secondary | ICD-10-CM | POA: Insufficient documentation

## 2014-12-21 DIAGNOSIS — D7589 Other specified diseases of blood and blood-forming organs: Secondary | ICD-10-CM | POA: Insufficient documentation

## 2014-12-21 DIAGNOSIS — I10 Essential (primary) hypertension: Secondary | ICD-10-CM | POA: Insufficient documentation

## 2014-12-21 DIAGNOSIS — J439 Emphysema, unspecified: Secondary | ICD-10-CM | POA: Insufficient documentation

## 2014-12-21 DIAGNOSIS — K219 Gastro-esophageal reflux disease without esophagitis: Secondary | ICD-10-CM | POA: Insufficient documentation

## 2014-12-21 DIAGNOSIS — F1020 Alcohol dependence, uncomplicated: Secondary | ICD-10-CM | POA: Insufficient documentation

## 2014-12-21 DIAGNOSIS — E559 Vitamin D deficiency, unspecified: Secondary | ICD-10-CM | POA: Insufficient documentation

## 2014-12-21 DIAGNOSIS — R748 Abnormal levels of other serum enzymes: Secondary | ICD-10-CM | POA: Insufficient documentation

## 2014-12-21 DIAGNOSIS — K7010 Alcoholic hepatitis without ascites: Secondary | ICD-10-CM | POA: Insufficient documentation

## 2014-12-27 ENCOUNTER — Ambulatory Visit (INDEPENDENT_AMBULATORY_CARE_PROVIDER_SITE_OTHER): Payer: Self-pay | Admitting: Family Medicine

## 2014-12-27 ENCOUNTER — Encounter: Payer: Self-pay | Admitting: Family Medicine

## 2014-12-27 DIAGNOSIS — Z720 Tobacco use: Secondary | ICD-10-CM

## 2014-12-27 DIAGNOSIS — E876 Hypokalemia: Secondary | ICD-10-CM

## 2014-12-27 DIAGNOSIS — F1029 Alcohol dependence with unspecified alcohol-induced disorder: Secondary | ICD-10-CM

## 2014-12-27 DIAGNOSIS — D7589 Other specified diseases of blood and blood-forming organs: Secondary | ICD-10-CM

## 2014-12-27 DIAGNOSIS — K7010 Alcoholic hepatitis without ascites: Secondary | ICD-10-CM

## 2014-12-27 DIAGNOSIS — G621 Alcoholic polyneuropathy: Secondary | ICD-10-CM

## 2014-12-27 DIAGNOSIS — R197 Diarrhea, unspecified: Secondary | ICD-10-CM | POA: Insufficient documentation

## 2014-12-27 DIAGNOSIS — R748 Abnormal levels of other serum enzymes: Secondary | ICD-10-CM

## 2014-12-27 DIAGNOSIS — F4323 Adjustment disorder with mixed anxiety and depressed mood: Secondary | ICD-10-CM

## 2014-12-27 NOTE — Assessment & Plan Note (Addendum)
Encouraged AA and counseling; list of counselors and phone numbers given; close f/u; discussed some of the effects alcohol had had on her body, the need to avoid alcohol one day at a time

## 2014-12-27 NOTE — Assessment & Plan Note (Signed)
Can be impacted by diarrhea and alco alcohol

## 2014-12-27 NOTE — Assessment & Plan Note (Signed)
Encouraged her to start working with a Social worker; list of counselors and phone numbers given

## 2014-12-27 NOTE — Progress Notes (Signed)
BP 137/88 mmHg  Pulse 83  Temp(Src) 97.2 F (36.2 C)  Ht 5' 3.5" (1.613 m)  Wt 188 lb (85.276 kg)  BMI 32.78 kg/m2  SpO2 96%   Subjective:    Patient ID: Beth [name], female    DOB: 12/31/1970, 44 y.o.   MRN: [id number]  HPI: Beth [name] is a 44 y.o. female  Chief Complaint  Patient presents with  . Hospitalization Follow-up   She was hospitalized at [facility] for 2 days She has not had any alcohol to drink since being discharged from the hospital Just not having it in the house is one help; she cannot drive to get more; the ones that were bringing it to her know to not bring it to the house now  She is not going to [place] Nerves get bad, people get on her nerves; no one at home is a trigger; she does not get panic attacks; not seeing psychiatrist or counselor; she has not seen either of those in the past Not seeing neurologist right now Not sleeping well, headaches at night; used to be on amitriptyline which helped but can't afford to go back to the neurologist  They need a letter for the caseworker to get food stamps; statement whether or not she can work  She is not ready to quit smoking right now, wants to handle one thing at a time  Reviewed hospital labs; MCV 110; potassium was 2.2 at one point; LFTs elevated   Relevant past medical, surgical, family and social history reviewed and updated as indicated. Interim medical history since our last visit reviewed. Allergies and medications reviewed and updated.  Review of Systems  Constitutional: Positive for unexpected weight change (down 24 pounds since May 2016 per our chart).  HENT: Negative for nosebleeds.   Eyes: Positive for visual disturbance (blurred vision, not new).  Gastrointestinal: Positive for diarrhea (had diarrhea for two weeks and that has slowed down; started before the hospital stay; thinks it was infection, really green; they did not test stools in the hospital; having 3-4 stools a day still). Negative  for blood in stool.  Endocrine: Positive for heat intolerance, polydipsia and polyuria. Negative for cold intolerance.  Genitourinary: Positive for urgency and frequency. Negative for dysuria, hematuria and difficulty urinating.  Neurological: Positive for headaches.  Hematological: Bruises/bleeds easily.  Psychiatric/Behavioral: Positive for sleep disturbance. Negative for hallucinations. The patient is nervous/anxious.    Per HPI unless specifically indicated above     Objective:    BP 137/88 mmHg  Pulse 83  Temp(Src) 97.2 F (36.2 C)  Ht 5' 3.5" (1.613 m)  Wt 188 lb (85.276 kg)  BMI 32.78 kg/m2  SpO2 96%  Wt Readings from Last 3 Encounters:  12/27/14 188 lb (85.276 kg)  09/13/14 212 lb (96.163 kg)  12/15/14 191 lb 4.8 oz (86.773 kg)    Physical Exam  Constitutional: She appears well-developed and well-nourished. No distress.  HENT:  Head: Normocephalic and atraumatic.  Eyes: EOM are normal. No scleral icterus.  Neck: No thyromegaly present.  Cardiovascular: Normal rate, regular rhythm and normal heart sounds.   No murmur heard. Pulmonary/Chest: Effort normal and breath sounds normal. No respiratory distress. She has no wheezes.  Abdominal: Soft. Bowel sounds are normal. She exhibits no distension. There is no tenderness.  Musculoskeletal: Normal range of motion. She exhibits no edema.  Neurological: She is alert. She exhibits normal muscle tone.  Skin: Skin is warm and dry. Ecchymosis noted. She is not diaphoretic.  No pallor.  Lower extremities have slightly mottled appearance, almost cutis marmorata look to it; shallow ecchymoses on the extensor surface of the arms  Psychiatric: She has a normal mood and affect. Her behavior is normal. Judgment and thought content normal.   Results for orders placed or performed during the hospital encounter of 12/14/14  Lipase, blood  Result Value Ref Range   Lipase 19 (L) 22 - 51 U/L  Comprehensive metabolic panel  Result Value Ref  Range   Sodium 138 135 - 145 mmol/L   Potassium 2.2 (LL) 3.5 - 5.1 mmol/L   Chloride 88 (L) 101 - 111 mmol/L   CO2 33 (H) 22 - 32 mmol/L   Glucose, Bld 128 (H) 65 - 99 mg/dL   BUN <5 (L) 6 - 20 mg/dL   Creatinine, Ser 0.80 0.44 - 1.00 mg/dL   Calcium 6.7 (L) 8.9 - 10.3 mg/dL   Total Protein 7.1 6.5 - 8.1 g/dL   Albumin 3.6 3.5 - 5.0 g/dL   AST 112 (H) 15 - 41 U/L   ALT 30 14 - 54 U/L   Alkaline Phosphatase 152 (H) 38 - 126 U/L   Total Bilirubin 0.5 0.3 - 1.2 mg/dL   GFR calc non Af Amer >60 >60 mL/min   GFR calc Af Amer >60 >60 mL/min   Anion gap 17 (H) 5 - 15  CBC  Result Value Ref Range   WBC 11.1 (H) 3.6 - 11.0 K/uL   RBC 3.77 (L) 3.80 - 5.20 MIL/uL   Hemoglobin 14.1 12.0 - 16.0 g/dL   HCT 41.5 35.0 - 47.0 %   MCV 110.0 (H) 80.0 - 100.0 fL   MCH 37.3 (H) 26.0 - 34.0 pg   MCHC 33.9 32.0 - 36.0 g/dL   RDW 16.0 (H) 11.5 - 14.5 %   Platelets 301 150 - 440 K/uL  Urinalysis complete, with microscopic ([facility] only)  Result Value Ref Range   Color, Urine YELLOW (A) YELLOW   APPearance CLEAR (A) CLEAR   Glucose, UA NEGATIVE NEGATIVE mg/dL   Bilirubin Urine NEGATIVE NEGATIVE   Ketones, ur NEGATIVE NEGATIVE mg/dL   Specific Gravity, Urine 1.015 1.005 - 1.030   Hgb urine dipstick NEGATIVE NEGATIVE   pH 6.0 5.0 - 8.0   Protein, ur NEGATIVE NEGATIVE mg/dL   Nitrite NEGATIVE NEGATIVE   Leukocytes, UA TRACE (A) NEGATIVE   RBC / HPF 0-5 0 - 5 RBC/hpf   WBC, UA 0-5 0 - 5 WBC/hpf   Bacteria, UA RARE (A) NONE SEEN   Squamous Epithelial / LPF 6-30 (A) NONE SEEN   Mucous PRESENT    Amorphous Crystal PRESENT   Magnesium  Result Value Ref Range   Magnesium 1.0 (L) 1.7 - 2.4 mg/dL  Phosphorus  Result Value Ref Range   Phosphorus 4.3 2.5 - 4.6 mg/dL  Basic metabolic panel  Result Value Ref Range   Sodium 140 135 - 145 mmol/L   Potassium 2.6 (LL) 3.5 - 5.1 mmol/L   Chloride 93 (L) 101 - 111 mmol/L   CO2 35 (H) 22 - 32 mmol/L   Glucose, Bld 106 (H) 65 - 99 mg/dL   BUN <5 (L) 6 - 20  mg/dL   Creatinine, Ser 0.73 0.44 - 1.00 mg/dL   Calcium 6.4 (LL) 8.9 - 10.3 mg/dL   GFR calc non Af Amer >60 >60 mL/min   GFR calc Af Amer >60 >60 mL/min   Anion gap 12 5 - 15  CBC  Result Value  Ref Range   WBC 10.4 3.6 - 11.0 K/uL   RBC 3.51 (L) 3.80 - 5.20 MIL/uL   Hemoglobin 13.1 12.0 - 16.0 g/dL   HCT 38.8 35.0 - 47.0 %   MCV 110.3 (H) 80.0 - 100.0 fL   MCH 37.2 (H) 26.0 - 34.0 pg   MCHC 33.7 32.0 - 36.0 g/dL   RDW 16.1 (H) 11.5 - 14.5 %   Platelets 285 150 - 440 K/uL  Basic metabolic panel  Result Value Ref Range   Sodium 141 135 - 145 mmol/L   Potassium 3.2 (L) 3.5 - 5.1 mmol/L   Chloride 99 (L) 101 - 111 mmol/L   CO2 29 22 - 32 mmol/L   Glucose, Bld 96 65 - 99 mg/dL   BUN <5 (L) 6 - 20 mg/dL   Creatinine, Ser 0.76 0.44 - 1.00 mg/dL   Calcium 6.6 (L) 8.9 - 10.3 mg/dL   GFR calc non Af Amer >60 >60 mL/min   GFR calc Af Amer >60 >60 mL/min   Anion gap 13 5 - 15      Assessment & Plan:   Problem List Items Addressed This Visit      Digestive   Chronic alcoholic hepatitis    Reviewed liver enzymes in the hospital; will recheck today, expect numbers to be decreasing with cessation        Nervous and Auditory   Alcoholic peripheral neuropathy    Check folic acid and V56; she should get back in to see her neurologist; she has not had any alcohol for three weeks now, and I encouraged AA, no alcohol one day at a time      Relevant Orders   Vitamin B12   Folate     Other   Tobacco abuse    Patient is not ready to address today, not ready to quit      Hypokalemia    Can be impacted by alcoholism and diarrhea from GI losses of electrolytes      Relevant Orders   Comprehensive metabolic panel   Hypomagnesemia - Primary    Can be impacted by diarrhea and alco alcohol      Relevant Orders   Magnesium   Chronic alcoholism    Encouraged AA and counseling; list of counselors and phone numbers given; close f/u; discussed some of the effects alcohol had had on  her body, the need to avoid alcohol one day at a time      Relevant Orders   Vitamin B12   Folate   Macrocytosis    Explained last MCV was 110 indicating toxic effects of alcohol on the bone marrow; so glad she has quit      Relevant Orders   Vitamin B12   Folate   Elevated liver enzymes    Likely related to alcoholism; recheck today      Relevant Orders   Comprehensive metabolic panel   Adjustment disorder with mixed anxiety and depressed mood    Encouraged her to start working with a counselor; list of counselors and phone numbers given      Diarrhea    Check stool studies, ordered today; no travel, no sick pets; no recent antibiotics      Relevant Orders   Stool Culture   Stool C-Diff Toxin Assay   Ova and parasite examination   Comprehensive metabolic panel      Follow up plan: Return in about 4 weeks (around 01/24/2015) for follow-up.  Orders Placed This Encounter  Procedures  . Stool Culture  . Stool C-Diff Toxin Assay  . Ova and parasite examination  . Comprehensive metabolic panel  . Magnesium  . Vitamin B12  . Folate   I did not order GGT, CBC since she does not have insurance; picked which ones were felt to be most important for today

## 2014-12-27 NOTE — Assessment & Plan Note (Signed)
Can be impacted by alcoholism and diarrhea from GI losses of electrolytes

## 2014-12-27 NOTE — Assessment & Plan Note (Signed)
Likely related to alcoholism; recheck today

## 2014-12-27 NOTE — Assessment & Plan Note (Signed)
Reviewed liver enzymes in the hospital; will recheck today, expect numbers to be decreasing with cessation

## 2014-12-27 NOTE — Assessment & Plan Note (Signed)
Patient is not ready to address today, not ready to quit

## 2014-12-27 NOTE — Assessment & Plan Note (Signed)
Check stool studies, ordered today; no travel, no sick pets; no recent antibiotics

## 2014-12-27 NOTE — Patient Instructions (Addendum)
Do contact the [facility] to see if you can receive care there Address: [address], [place], [place] [address]  Phone: [phone]  We'll get some labs today Do consider working with a therapist Consider going to Alcoholics Anonymous which is completely free You can do this ONE DAY AT A TIME I recommend good diet, all the colors of rainbow in fruits and vegetables Return in 3-4 weeks

## 2014-12-27 NOTE — Assessment & Plan Note (Addendum)
Check folic acid and O45; she should get back in to see her neurologist; she has not had any alcohol for three weeks now, and I encouraged AA, no alcohol one day at a time

## 2014-12-27 NOTE — Assessment & Plan Note (Signed)
Explained last MCV was 110 indicating toxic effects of alcohol on the bone marrow; so glad she has quit

## 2014-12-28 ENCOUNTER — Encounter: Payer: Self-pay | Admitting: Family Medicine

## 2014-12-28 LAB — COMPREHENSIVE METABOLIC PANEL
ALBUMIN: 4.1 g/dL (ref 3.5–5.5)
ALK PHOS: 183 IU/L — AB (ref 39–117)
ALT: 19 IU/L (ref 0–32)
AST: 39 IU/L (ref 0–40)
Albumin/Globulin Ratio: 1.6 (ref 1.1–2.5)
BILIRUBIN TOTAL: 0.4 mg/dL (ref 0.0–1.2)
BUN / CREAT RATIO: 14 (ref 9–23)
BUN: 10 mg/dL (ref 6–24)
CO2: 21 mmol/L (ref 18–29)
CREATININE: 0.72 mg/dL (ref 0.57–1.00)
Calcium: 9 mg/dL (ref 8.7–10.2)
Chloride: 96 mmol/L — ABNORMAL LOW (ref 97–108)
GFR calc non Af Amer: 101 mL/min/{1.73_m2} (ref >59)
GFR, EST AFRICAN AMERICAN: 117 mL/min/{1.73_m2} (ref >59)
GLOBULIN, TOTAL: 2.6 g/dL (ref 1.5–4.5)
Glucose: 102 mg/dL — ABNORMAL HIGH (ref 65–99)
Potassium: 4.3 mmol/L (ref 3.5–5.2)
SODIUM: 139 mmol/L (ref 134–144)
TOTAL PROTEIN: 6.7 g/dL (ref 6.0–8.5)

## 2014-12-28 LAB — VITAMIN B12: Vitamin B-12: 548 pg/mL (ref 211–946)

## 2014-12-28 LAB — FOLATE: Folate: 2.8 ng/mL — ABNORMAL LOW (ref >3.0)

## 2014-12-28 LAB — MAGNESIUM: MAGNESIUM: 1.9 mg/dL (ref 1.6–2.3)

## 2014-12-31 LAB — CLOSTRIDIUM DIFFICILE EIA: C DIFFICILE TOXINS A+ B, EIA: NEGATIVE

## 2015-01-03 LAB — STOOL CULTURE

## 2015-01-03 LAB — OVA AND PARASITE EXAMINATION

## 2015-01-12 ENCOUNTER — Other Ambulatory Visit: Payer: Self-pay | Admitting: Family Medicine

## 2015-01-17 ENCOUNTER — Ambulatory Visit: Payer: Self-pay | Admitting: Family Medicine

## 2015-01-25 ENCOUNTER — Ambulatory Visit (INDEPENDENT_AMBULATORY_CARE_PROVIDER_SITE_OTHER): Payer: Self-pay | Admitting: Family Medicine

## 2015-01-25 ENCOUNTER — Encounter: Payer: Self-pay | Admitting: Family Medicine

## 2015-01-25 VITALS — BP 106/73 | HR 79 | Temp 97.2°F | Ht 64.25 in | Wt 192.8 lb

## 2015-01-25 DIAGNOSIS — E538 Deficiency of other specified B group vitamins: Secondary | ICD-10-CM

## 2015-01-25 DIAGNOSIS — Z720 Tobacco use: Secondary | ICD-10-CM

## 2015-01-25 DIAGNOSIS — Z23 Encounter for immunization: Secondary | ICD-10-CM

## 2015-01-25 DIAGNOSIS — F1011 Alcohol abuse, in remission: Secondary | ICD-10-CM

## 2015-01-25 DIAGNOSIS — R748 Abnormal levels of other serum enzymes: Secondary | ICD-10-CM

## 2015-01-25 DIAGNOSIS — Z638 Other specified problems related to primary support group: Secondary | ICD-10-CM

## 2015-01-25 DIAGNOSIS — F1010 Alcohol abuse, uncomplicated: Secondary | ICD-10-CM

## 2015-01-25 NOTE — Assessment & Plan Note (Signed)
So glad to hear patient is still abstaining from alcohol; she is not interested in AA at this time; she will take it one day at a time

## 2015-01-25 NOTE — Assessment & Plan Note (Signed)
Patient not interested in counseling; discussed strategies for coping, healthy vs unhealthy behaviors

## 2015-01-25 NOTE — Assessment & Plan Note (Signed)
She is not interested in quitting right now; tips for success given in after visit summary; explained dangers of smoking don't just include stroke and heart attack and lung cancer, but also increased risk of cervical cancer and leukemia; I am here to help if and when she is ready to quit

## 2015-01-25 NOTE — Assessment & Plan Note (Signed)
Continue supplementation  ?

## 2015-01-25 NOTE — Patient Instructions (Addendum)
You can try miralax or colace for constipation; okay to use per package directions Consider AA or other program to help you stay sober Consider giving up your cigarettes when the time is right for you I am here to help Return in 2 months and we'll get fasting labs at that time You received the flu shot today; it should protect you against the flu virus over the coming months; it will take about two weeks for antibodies to develop; do try to stay away from hospitals, nursing homes, and daycares during peak flu season; taking 1000 mg of vitamin C daily during flu season may help you avoid getting sick   Smoking Cessation, Tips for Success If you are ready to quit smoking, congratulations! You have chosen to help yourself be healthier. Cigarettes bring nicotine, tar, carbon monoxide, and other irritants into your body. Your lungs, heart, and blood vessels will be able to work better without these poisons. There are many different ways to quit smoking. Nicotine gum, nicotine patches, a nicotine inhaler, or nicotine nasal spray can help with physical craving. Hypnosis, support groups, and medicines help break the habit of smoking. WHAT THINGS CAN I DO TO MAKE QUITTING EASIER?  Here are some tips to help you quit for good:  Pick a date when you will quit smoking completely. Tell all of your friends and family about your plan to quit on that date.  Do not try to slowly cut down on the number of cigarettes you are smoking. Pick a quit date and quit smoking completely starting on that day.  Throw away all cigarettes.   Clean and remove all ashtrays from your home, work, and car.  On a card, write down your reasons for quitting. Carry the card with you and read it when you get the urge to smoke.  Cleanse your body of nicotine. Drink enough water and fluids to keep your urine clear or pale yellow. Do this after quitting to flush the nicotine from your body.  Learn to predict your moods. Do not let a  bad situation be your excuse to have a cigarette. Some situations in your life might tempt you into wanting a cigarette.  Never have "just one" cigarette. It leads to wanting another and another. Remind yourself of your decision to quit.  Change habits associated with smoking. If you smoked while driving or when feeling stressed, try other activities to replace smoking. Stand up when drinking your coffee. Brush your teeth after eating. Sit in a different chair when you read the paper. Avoid alcohol while trying to quit, and try to drink fewer caffeinated beverages. Alcohol and caffeine may urge you to smoke.  Avoid foods and drinks that can trigger a desire to smoke, such as sugary or spicy foods and alcohol.  Ask people who smoke not to smoke around you.  Have something planned to do right after eating or having a cup of coffee. For example, plan to take a walk or exercise.  Try a relaxation exercise to calm you down and decrease your stress. Remember, you may be tense and nervous for the first 2 weeks after you quit, but this will pass.  Find new activities to keep your hands busy. Play with a pen, coin, or rubber band. Doodle or draw things on paper.  Brush your teeth right after eating. This will help cut down on the craving for the taste of tobacco after meals. You can also try mouthwash.   Use oral substitutes in  place of cigarettes. Try using lemon drops, carrots, cinnamon sticks, or chewing gum. Keep them handy so they are available when you have the urge to smoke.  When you have the urge to smoke, try deep breathing.  Designate your home as a nonsmoking area.  If you are a heavy smoker, ask your health care provider about a prescription for nicotine chewing gum. It can ease your withdrawal from nicotine.  Reward yourself. Set aside the cigarette money you save and buy yourself something nice.  Look for support from others. Join a support group or smoking cessation program. Ask  someone at home or at work to help you with your plan to quit smoking.  Always ask yourself, "Do I need this cigarette or is this just a reflex?" Tell yourself, "Today, I choose not to smoke," or "I do not want to smoke." You are reminding yourself of your decision to quit.  Do not replace cigarette smoking with electronic cigarettes (commonly called e-cigarettes). The safety of e-cigarettes is unknown, and some may contain harmful chemicals.  If you relapse, do not give up! Plan ahead and think about what you will do the next time you get the urge to smoke. HOW WILL I FEEL WHEN I QUIT SMOKING? You may have symptoms of withdrawal because your body is used to nicotine (the addictive substance in cigarettes). You may crave cigarettes, be irritable, feel very hungry, cough often, get headaches, or have difficulty concentrating. The withdrawal symptoms are only temporary. They are strongest when you first quit but will go away within 10-14 days. When withdrawal symptoms occur, stay in control. Think about your reasons for quitting. Remind yourself that these are signs that your body is healing and getting used to being without cigarettes. Remember that withdrawal symptoms are easier to treat than the major diseases that smoking can cause.  Even after the withdrawal is over, expect periodic urges to smoke. However, these cravings are generally short lived and will go away whether you smoke or not. Do not smoke! WHAT RESOURCES ARE AVAILABLE TO HELP ME QUIT SMOKING? Your health care provider can direct you to community resources or hospitals for support, which may include:  Group support.  Education.  Hypnosis.  Therapy. Document Released: 01/13/2004 Document Revised: 08/31/2013 Document Reviewed: 10/02/2012 [facility] Patient Information 2015 [place], [place]. This information is not intended to replace advice given to you by your health care provider. Make sure you discuss any questions you have with  your health care provider.

## 2015-01-25 NOTE — Assessment & Plan Note (Signed)
Just had liver US done in August; will recheck alk phos along with sgot and sgpt at f/u in a few months

## 2015-01-25 NOTE — Progress Notes (Signed)
BP 106/73 mmHg  Pulse 79  Temp(Src) 97.2 F (36.2 C)  Ht 5' 4.25" (1.632 m)  Wt 192 lb 12.8 oz (87.454 kg)  BMI 32.84 kg/m2  SpO2 97%   Subjective:    Patient ID: Beth [name], female    DOB: February 10, 1971, 44 y.o.   MRN: [id number]  HPI: Beth [name] is a 44 y.o. female  Chief Complaint  Patient presents with  . Follow-up   No medical excitement since last visit She has not had anything to drink since before the hospitalization We talked about coping skills; she gets angry but doesn't hurt anyone She cleans when stressed, helps her deal; removing from people or situations help; thought about stress ball but joked she might break it She has not thought about activities; thought about journaling Low folic acid, now taking every day Not taking any other vitamins Dr. Grayland Ormond told her to stop her other vitamins last year Constipation some over the last week; wants to know what she can use Skin looks better than last visit She finally got her disability and that is a weight off of her shoulders  Relevant past medical, surgical, family and social history reviewed and updated as indicated. Interim medical history since our last visit reviewed. Allergies and medications reviewed and updated.  Review of Systems Per HPI unless specifically indicated above     Objective:    BP 106/73 mmHg  Pulse 79  Temp(Src) 97.2 F (36.2 C)  Ht 5' 4.25" (1.632 m)  Wt 192 lb 12.8 oz (87.454 kg)  BMI 32.84 kg/m2  SpO2 97%  Wt Readings from Last 3 Encounters:  01/25/15 192 lb 12.8 oz (87.454 kg)  12/27/14 188 lb (85.276 kg)  09/13/14 212 lb (96.163 kg)    Physical Exam  Constitutional: She appears well-developed and well-nourished.  Cardiovascular: Normal rate and regular rhythm.   Pulmonary/Chest: Effort normal and breath sounds normal.  Abdominal: Soft. Bowel sounds are normal. There is no tenderness. There is no guarding.  Skin:  Less mottled; no ecchymoses  Psychiatric: She  has a normal mood and affect. Her speech is normal and behavior is normal. Judgment and thought content normal. Cognition and memory are normal.   Results for orders placed or performed in visit on 12/27/14  Stool Culture  Result Value Ref Range   Campylobacter Culture Final report    RESULT 1 Comment   Stool C-Diff Toxin Assay  Result Value Ref Range   C difficile Toxins A+B, EIA Negative Negative  Ova and parasite examination  Result Value Ref Range   OVA + PARASITE EXAM Final report    O&P result 1 Comment   Comprehensive metabolic panel  Result Value Ref Range   Glucose 102 (H) 65 - 99 mg/dL   BUN 10 6 - 24 mg/dL   Creatinine, Ser 0.72 0.57 - 1.00 mg/dL   GFR calc non Af Amer 101 >59 mL/min/1.73   GFR calc Af Amer 117 >59 mL/min/1.73   BUN/Creatinine Ratio 14 9 - 23   Sodium 139 134 - 144 mmol/L   Potassium 4.3 3.5 - 5.2 mmol/L   Chloride 96 (L) 97 - 108 mmol/L   CO2 21 18 - 29 mmol/L   Calcium 9.0 8.7 - 10.2 mg/dL   Total Protein 6.7 6.0 - 8.5 g/dL   Albumin 4.1 3.5 - 5.5 g/dL   Globulin, Total 2.6 1.5 - 4.5 g/dL   Albumin/Globulin Ratio 1.6 1.1 - 2.5   Bilirubin Total 0.4  0.0 - 1.2 mg/dL   Alkaline Phosphatase 183 (H) 39 - 117 IU/L   AST 39 0 - 40 IU/L   ALT 19 0 - 32 IU/L  Magnesium  Result Value Ref Range   Magnesium 1.9 1.6 - 2.3 mg/dL  Vitamin B12  Result Value Ref Range   Vitamin B-12 548 211 - 946 pg/mL  Folate  Result Value Ref Range   Folate 2.8 (L) >3.0 ng/mL      Assessment & Plan:   Problem List Items Addressed This Visit      Other   Alcohol abuse, in remission - Primary    So glad to hear patient is still abstaining from alcohol; she is not interested in AA at this time; she will take it one day at a time      Tobacco abuse    She is not interested in quitting right now; tips for success given in after visit summary; explained dangers of smoking don't just include stroke and heart attack and lung cancer, but also increased risk of cervical  cancer and leukemia; I am here to help if and when she is ready to quit      Elevated alkaline phosphatase level    Just had liver US done in August; will recheck alk phos along with sgot and sgpt at f/u in a few months      Stress due to family tension    Patient not interested in counseling; discussed strategies for coping, healthy vs unhealthy behaviors      Folic acid deficiency    Continue supplementation       Other Visit Diagnoses    Need for immunization against influenza        flu vaccine given today    Relevant Orders    Flu Vaccine QUAD 36+ mos PF IM (Fluarix & Fluzone Quad PF) (Completed)       Follow up plan: Return in about 2 months (around 03/27/2015) for visit and labs.  An after-visit summary was printed and given to the patient at [facility].  Please see the patient instructions which may contain other information and recommendations beyond what is mentioned above in the assessment and plan.

## 2015-03-29 ENCOUNTER — Ambulatory Visit: Payer: Self-pay | Admitting: Family Medicine

## 2015-04-07 ENCOUNTER — Ambulatory Visit: Payer: Self-pay | Admitting: Family Medicine

## 2015-04-28 ENCOUNTER — Ambulatory Visit (INDEPENDENT_AMBULATORY_CARE_PROVIDER_SITE_OTHER): Payer: Self-pay | Admitting: Family Medicine

## 2015-04-28 ENCOUNTER — Encounter: Payer: Self-pay | Admitting: Family Medicine

## 2015-04-28 VITALS — BP 114/79 | HR 77 | Temp 97.8°F | Wt 193.0 lb

## 2015-04-28 DIAGNOSIS — F1021 Alcohol dependence, in remission: Secondary | ICD-10-CM

## 2015-04-28 DIAGNOSIS — I10 Essential (primary) hypertension: Secondary | ICD-10-CM

## 2015-04-28 DIAGNOSIS — E538 Deficiency of other specified B group vitamins: Secondary | ICD-10-CM

## 2015-04-28 DIAGNOSIS — R748 Abnormal levels of other serum enzymes: Secondary | ICD-10-CM

## 2015-04-28 DIAGNOSIS — D7589 Other specified diseases of blood and blood-forming organs: Secondary | ICD-10-CM

## 2015-04-28 DIAGNOSIS — G621 Alcoholic polyneuropathy: Secondary | ICD-10-CM

## 2015-04-28 MED ORDER — BUDESONIDE-FORMOTEROL FUMARATE 160-4.5 MCG/ACT IN AERO: 2.0000 | INHALATION_SPRAY | Freq: Two times a day (BID) | RESPIRATORY_TRACT | Status: DC

## 2015-04-28 MED ORDER — MELOXICAM 15 MG PO TABS: 15.0000 mg | ORAL_TABLET | Freq: Every day | ORAL | Status: DC

## 2015-04-28 MED ORDER — GABAPENTIN 800 MG PO TABS: 800.0000 mg | ORAL_TABLET | Freq: Three times a day (TID) | ORAL | Status: DC

## 2015-04-28 NOTE — Assessment & Plan Note (Signed)
In recovery; expect these numbers to come down; recheck today

## 2015-04-28 NOTE — Progress Notes (Signed)
BP 114/79 mmHg  Pulse 77  Temp(Src) 97.8 F (36.6 C)  Wt 193 lb (87.544 kg)  SpO2 97%   Subjective:    Patient ID: Beth [name], female    DOB: 1970-08-01, 44 y.o.   MRN: [id number]  HPI: Beth [name] is a 44 y.o. female  Chief Complaint  Patient presents with  . Follow-up    2 month follow up; recheck liver enzymes  . Leg Pain    bilateral leg and hip pain. Has been taking alot of Ibuprofen and wants to know if she can get something for pain relief.  . Needs CPE    she is willing to schedule a CPE and have a pap done   She got through the holidays without drinking Not smoking any more; still about 1 ppd; she is not interested in quitting now  No abdominal pain; no nausea; no jaundice; has been cramping in the past and taking bananas; she has been taking folic acid; no other vitamins; energy level is getting better; getting out more  More pain in the legs and hips; taking a lot of ibuprofen; the gabapentin does not seem to be helping; she says the ibuprofen kicks in after about 30 minutes; she is taking more ibuprofen than she should; she is getting close to or even above 12 pills a day; she does not want narcotics  Relevant past medical, surgical, family and social history reviewed and updated as indicated. Interim medical history since our last visit reviewed. Past Medical History  Diagnosis Date  . Fatty liver   . Arthritis   . Chronic back pain   . Chronic alcoholism in remission ([place])   . Alcohol abuse   . Chronic alcoholism ([place])   . Alcoholic peripheral neuropathy ([place])   . Macrocytosis     alcoholic  . Anemia   . Chronic alcoholic hepatitis   . Elevated liver enzymes Jan 2014    referred to and seen by GI  . Abnormal gamma-glutamyl transferase test   . GERD (gastroesophageal reflux disease)   . Hiatal hernia   . Asthma   . COPD (chronic obstructive pulmonary disease) ([place])   . Tobacco abuse   . Hypertension   . Cardiac murmur   . Vitamin D  deficiency disease   . Adjustment disorder with mixed anxiety and depressed mood   . Ruptured tubal pregnancy      Social History  Substance Use Topics  . Smoking status: Current Every Day Smoker -- 1.00 packs/day for 25 years    Types: Cigarettes  . Smokeless tobacco: Never Used  . Alcohol Use: No     Comment: Patient states no drinks in 6  months   Allergies and medications reviewed and updated.  Review of Systems Per HPI unless specifically indicated above     Objective:    BP 114/79 mmHg  Pulse 77  Temp(Src) 97.8 F (36.6 C)  Wt 193 lb (87.544 kg)  SpO2 97%  Wt Readings from Last 3 Encounters:  04/28/15 193 lb (87.544 kg)  01/25/15 192 lb 12.8 oz (87.454 kg)  12/27/14 188 lb (85.276 kg)    Physical Exam  Constitutional: She appears well-developed and well-nourished. No distress.  Weight gain five pounds over last 4 months  HENT:  Head: Normocephalic and atraumatic.  Eyes: EOM are normal. No scleral icterus.  Neck: No thyromegaly present.  Cardiovascular: Normal rate, regular rhythm and normal heart sounds.   No murmur heard. Pulmonary/Chest: Effort normal  and breath sounds normal. No respiratory distress. She has no wheezes.  Abdominal: Soft. Bowel sounds are normal. She exhibits no distension. There is no tenderness. There is no guarding.  Musculoskeletal: Normal range of motion. She exhibits no edema.  Neurological: She is alert. She exhibits normal muscle tone.  Skin: Skin is warm and dry. She is not diaphoretic. No pallor.  Psychiatric: She has a normal mood and affect. Her behavior is normal. Judgment and thought content normal.   Results for orders placed or performed in visit on 12/27/14  Stool Culture  Result Value Ref Range   Campylobacter Culture Final report    RESULT 1 Comment   Stool C-Diff Toxin Assay  Result Value Ref Range   C difficile Toxins A+B, EIA Negative Negative  Ova and parasite examination  Result Value Ref Range   OVA +  PARASITE EXAM Final report    O&P result 1 Comment   Comprehensive metabolic panel  Result Value Ref Range   Glucose 102 (H) 65 - 99 mg/dL   BUN 10 6 - 24 mg/dL   Creatinine, Ser 0.72 0.57 - 1.00 mg/dL   GFR calc non Af Amer 101 >59 mL/min/1.73   GFR calc Af Amer 117 >59 mL/min/1.73   BUN/Creatinine Ratio 14 9 - 23   Sodium 139 134 - 144 mmol/L   Potassium 4.3 3.5 - 5.2 mmol/L   Chloride 96 (L) 97 - 108 mmol/L   CO2 21 18 - 29 mmol/L   Calcium 9.0 8.7 - 10.2 mg/dL   Total Protein 6.7 6.0 - 8.5 g/dL   Albumin 4.1 3.5 - 5.5 g/dL   Globulin, Total 2.6 1.5 - 4.5 g/dL   Albumin/Globulin Ratio 1.6 1.1 - 2.5   Bilirubin Total 0.4 0.0 - 1.2 mg/dL   Alkaline Phosphatase 183 (H) 39 - 117 IU/L   AST 39 0 - 40 IU/L   ALT 19 0 - 32 IU/L  Magnesium  Result Value Ref Range   Magnesium 1.9 1.6 - 2.3 mg/dL  Vitamin B12  Result Value Ref Range   Vitamin B-12 548 211 - 946 pg/mL  Folate  Result Value Ref Range   Folate 2.8 (L) >3.0 ng/mL      Assessment & Plan:   Problem List Items Addressed This Visit      Cardiovascular and Mediastinum   Hypertension - Primary    Controlled today; work on weight loss, modest, little by little        Nervous and Auditory   Alcoholic peripheral neuropathy ([place])    Sounds like there is some recovery which is great news; will continue gabapentin and start NSAID; do not take any other NSAIDs on Rx; avoid narcotics which could trigger relapase      Relevant Medications   gabapentin (NEURONTIN) 800 MG tablet     Other   Chronic alcoholism in remission ([place])    encouragement given to stay sober; no current support, but suggested AA or other support      Macrocytosis    In recovery; expect these numbers to come down; recheck today      Relevant Orders   CBC with Differential/Platelet   Elevated alkaline phosphatase level    Recheck today, will hope this is coming down with alcoholism in recovery; she saw GI, but not recently      Relevant  Orders   Comprehensive metabolic panel   Folic acid deficiency    Check today; now that she is sober, she may  be able to stop this or decrease in the near future      Relevant Orders   Folate   Hypomagnesemia   Relevant Orders   Magnesium       Follow up plan: Return 1-2 months, for complete physical.  Orders Placed This Encounter  Procedures  . CBC with Differential/Platelet  . Magnesium  . Comprehensive metabolic panel  . Folate   Meds ordered this encounter  Medications  . FOLIC ACID PO    Sig: Take by mouth 2 (two) times daily.  Marland Kitchen gabapentin (NEURONTIN) 800 MG tablet    Sig: Take 1 tablet (800 mg total) by mouth 3 (three) times daily.    Dispense:  90 tablet    Refill:  3  . meloxicam (MOBIC) 15 MG tablet    Sig: Take 1 tablet (15 mg total) by mouth daily. Do not take with ibuprofen or other NSAIDs; take with food    Dispense:  30 tablet    Refill:  3

## 2015-04-28 NOTE — Assessment & Plan Note (Signed)
Check today; now that she is sober, she may be able to stop this or decrease in the near future

## 2015-04-28 NOTE — Assessment & Plan Note (Signed)
Controlled today; work on weight loss, modest, little by little

## 2015-04-28 NOTE — Assessment & Plan Note (Signed)
Recheck today, will hope this is coming down with alcoholism in recovery; she saw GI, but not recently

## 2015-04-28 NOTE — Assessment & Plan Note (Signed)
encouragement given to stay sober; no current support, but suggested AA or other support

## 2015-04-28 NOTE — Addendum Note (Signed)
Addended by: LADA, Satira Anis on: 04/28/2015 09:57 AM   Modules accepted: Orders

## 2015-04-28 NOTE — Assessment & Plan Note (Signed)
Sounds like there is some recovery which is great news; will continue gabapentin and start NSAID; do not take any other NSAIDs on Rx; avoid narcotics which could trigger relapase

## 2015-04-28 NOTE — Patient Instructions (Addendum)
Try turmeric as a natural anti-inflammatory (for pain and arthritis). It comes in capsules where you buy aspirin and fish oil, but also as a spice where you buy pepper and garlic powder. Start the new meloxicam for leg pain; do NOT take any other non-steroidals Continue the gabapentin; do not ever stop this cold [place] We'll check labs today and let you know those results Return for a physical in the next few months (if you can come fasting, we can check your cholesterol that day)

## 2015-04-29 LAB — CBC WITH DIFFERENTIAL/PLATELET
BASOS: 1 %
Basophils Absolute: 0.1 10*3/uL (ref 0.0–0.2)
EOS (ABSOLUTE): 0.2 10*3/uL (ref 0.0–0.4)
Eos: 2 %
HEMOGLOBIN: 16.1 g/dL — AB (ref 11.1–15.9)
Hematocrit: 46.4 % (ref 34.0–46.6)
IMMATURE GRANS (ABS): 0 10*3/uL (ref 0.0–0.1)
IMMATURE GRANULOCYTES: 0 %
LYMPHS: 33 %
Lymphocytes Absolute: 3.8 10*3/uL — ABNORMAL HIGH (ref 0.7–3.1)
MCH: 33.2 pg — ABNORMAL HIGH (ref 26.6–33.0)
MCHC: 34.7 g/dL (ref 31.5–35.7)
MCV: 96 fL (ref 79–97)
MONOCYTES: 4 %
Monocytes Absolute: 0.5 10*3/uL (ref 0.1–0.9)
NEUTROS PCT: 60 %
Neutrophils Absolute: 7.1 10*3/uL — ABNORMAL HIGH (ref 1.4–7.0)
PLATELETS: 261 10*3/uL (ref 150–379)
RBC: 4.85 x10E6/uL (ref 3.77–5.28)
RDW: 13.5 % (ref 12.3–15.4)
WBC: 11.7 10*3/uL — ABNORMAL HIGH (ref 3.4–10.8)

## 2015-04-29 LAB — COMPREHENSIVE METABOLIC PANEL
ALT: 32 IU/L (ref 0–32)
AST: 28 IU/L (ref 0–40)
Albumin/Globulin Ratio: 1.6 (ref 1.1–2.5)
Albumin: 4.1 g/dL (ref 3.5–5.5)
Alkaline Phosphatase: 263 IU/L — ABNORMAL HIGH (ref 39–117)
BUN/Creatinine Ratio: 17 (ref 9–23)
BUN: 14 mg/dL (ref 6–24)
Bilirubin Total: <0.2 mg/dL (ref 0.0–1.2)
CALCIUM: 9.2 mg/dL (ref 8.7–10.2)
CO2: 26 mmol/L (ref 18–29)
CREATININE: 0.84 mg/dL (ref 0.57–1.00)
Chloride: 96 mmol/L (ref 96–106)
GFR calc Af Amer: 97 mL/min/{1.73_m2} (ref >59)
GFR, EST NON AFRICAN AMERICAN: 84 mL/min/{1.73_m2} (ref >59)
GLOBULIN, TOTAL: 2.5 g/dL (ref 1.5–4.5)
Glucose: 88 mg/dL (ref 65–99)
Potassium: 4.9 mmol/L (ref 3.5–5.2)
Sodium: 138 mmol/L (ref 134–144)
TOTAL PROTEIN: 6.6 g/dL (ref 6.0–8.5)

## 2015-04-29 LAB — FOLATE: Folate: 14.5 ng/mL (ref >3.0)

## 2015-04-29 LAB — MAGNESIUM: MAGNESIUM: 2.3 mg/dL (ref 1.6–2.3)

## 2015-05-04 ENCOUNTER — Telehealth: Payer: Self-pay

## 2015-05-04 NOTE — Telephone Encounter (Signed)
She would like her lab results.

## 2015-05-04 NOTE — Telephone Encounter (Signed)
Alk phos is very high; she's going to need to get back in to see GI specialist I'll call her tomorrow (it's nearly 7 pm, and I was out of the office today)

## 2015-05-05 NOTE — Telephone Encounter (Signed)
I spoke with patient; she is under the care of a liver doctor, so she'll call and make a follow-up appointment with him; I'll spare her additional labs, because he would probably get what I would order and then some, so she'd be stuck with two venipunctures; I'll send him a message to alert him to her high alk phos I explained that her folate is completely normal, and her MCV is back to normal too; I believe that's because she has quit drinking; praise given

## 2015-06-08 ENCOUNTER — Encounter: Payer: Self-pay | Admitting: Family Medicine

## 2015-06-09 ENCOUNTER — Encounter: Payer: Self-pay | Admitting: Gastroenterology

## 2015-06-09 ENCOUNTER — Other Ambulatory Visit
Admission: RE | Admit: 2015-06-09 | Discharge: 2015-06-09 | Disposition: A | Discharge status: Home / Self Care | Payer: PRIVATE HEALTH INSURANCE | Source: Ambulatory Visit | Attending: Gastroenterology | Admitting: Gastroenterology

## 2015-06-09 ENCOUNTER — Ambulatory Visit (INDEPENDENT_AMBULATORY_CARE_PROVIDER_SITE_OTHER): Payer: Self-pay | Admitting: Gastroenterology

## 2015-06-09 VITALS — BP 143/77 | HR 79 | Temp 97.8°F | Ht 64.0 in | Wt 199.0 lb

## 2015-06-09 DIAGNOSIS — R7989 Other specified abnormal findings of blood chemistry: Secondary | ICD-10-CM

## 2015-06-09 DIAGNOSIS — M7989 Other specified soft tissue disorders: Secondary | ICD-10-CM | POA: Insufficient documentation

## 2015-06-09 DIAGNOSIS — R945 Abnormal results of liver function studies: Principal | ICD-10-CM

## 2015-06-09 LAB — GAMMA GT: GGT: 141 U/L — ABNORMAL HIGH (ref 7–50)

## 2015-06-09 NOTE — Progress Notes (Signed)
Gastroenterology Consultation  Referring Provider:     Arnetha Courser, MD Primary Care Physician:  Enid Derry, MD Primary Gastroenterologist:  Dr. Allen Norris     Reason for Consultation:     Abnormal liver enzymes        HPI:   Beth [name] is a 45 y.o. y/o female referred for consultation & management of abnormal liver enzymes by Dr. Enid Derry, MD.  This patient comes today with a finding of increased alkaline phosphatase. This has been elevated over the last few blood tests. The patient has an extensive history of alcohol abuse and states she quit approximately 7 months ago. The patient reports that she feels much better since she quit drinking. She states that she used to walk with a walker and now she uses a cane but only needs it sometimes. There is no report of any unexplained weight loss fevers chills nausea vomiting. The patient has had normal liver enzymes except for the increase alkaline phosphatase. There is no report of any abdominal pain. She denies any black stools or bloody stools.    Past Medical History  Diagnosis Date  . Fatty liver   . Arthritis   . Chronic back pain   . Chronic alcoholism in remission ([place])   . Alcohol abuse   . Chronic alcoholism ([place])   . Alcoholic peripheral neuropathy ([place])   . Macrocytosis     alcoholic  . Anemia   . Chronic alcoholic hepatitis   . Elevated liver enzymes Jan 2014    referred to and seen by GI  . Abnormal gamma-glutamyl transferase test   . GERD (gastroesophageal reflux disease)   . Hiatal hernia   . Asthma   . COPD (chronic obstructive pulmonary disease) ([place])   . Tobacco abuse   . Hypertension   . Cardiac murmur   . Vitamin D deficiency disease   . Adjustment disorder with mixed anxiety and depressed mood   . Ruptured tubal pregnancy     Past Surgical History  Procedure Laterality Date  . Tubal ligation  1992  . Finger surgery    . Laparoscopic oopherectomy Right     Prior to Admission medications     Medication Sig Start Date End Date Taking? Authorizing Provider  albuterol (PROVENTIL HFA;VENTOLIN HFA) 108 (90 Base) MCG/ACT inhaler Inhale into the lungs every 6 (six) hours as needed for wheezing or shortness of breath.   Yes Historical Provider, MD  amLODipine (NORVASC) 5 MG tablet take 1 tablet by mouth once daily 01/12/15  Yes Arnetha Courser, MD  budesonide-formoterol (SYMBICORT) 160-4.5 MCG/ACT inhaler Inhale 2 puffs into the lungs 2 (two) times daily. Reported on 04/28/2015 04/28/15  Yes Arnetha Courser, MD  gabapentin (NEURONTIN) 800 MG tablet Take 1 tablet (800 mg total) by mouth 3 (three) times daily. 04/28/15  Yes Arnetha Courser, MD  meloxicam (MOBIC) 15 MG tablet Take 1 tablet (15 mg total) by mouth daily. Do not take with ibuprofen or other NSAIDs; take with food 04/28/15  Yes Arnetha Courser, MD  omeprazole (PRILOSEC) 10 MG capsule Take 10 mg by mouth daily.   Yes Historical Provider, MD  amitriptyline (ELAVIL) 25 MG tablet take 2 tablets by mouth at bedtime (PLEASE SCHEDULE AN APPT WITH YOUR DR) Patient not taking: Reported on 04/28/2015 11/15/14   Penni Bombard, MD  FOLIC ACID PO Take by mouth 2 (two) times daily. Reported on 06/09/2015    Historical Provider, MD  Family History  Problem Relation Age of Onset  . Cancer Mother     lung  . Kidney failure Father   . Diabetes Father   . Hypertension Father   . Heart disease Father   . Heart attack Father   . Hypertension Sister   . Cancer Brother     lung, liver  . Diabetes Brother   . Hypertension Brother   . Diabetes Maternal Grandmother   . Hypertension Maternal Grandmother   . Hypertension Maternal Grandfather   . Hypertension Paternal Grandmother   . Hypertension Paternal Grandfather   . Cancer Brother     lung, brain     Social History  Substance Use Topics  . Smoking status: Current Every Day Smoker -- 1.00 packs/day for 25 years    Types: Cigarettes  . Smokeless tobacco: Never Used  . Alcohol Use: No      Comment: Patient states no drinks in 6  months    Allergies as of 06/09/2015 - Review Complete 06/09/2015  Allergen Reaction Noted  . Latex  12/21/2014  . Penicillins Hives 12/14/2014  . Erythromycin Itching and Rash 08/08/2011    Review of Systems:    All systems reviewed and negative except where noted in HPI.   Physical Exam:  BP 143/77 mmHg  Pulse 79  Temp(Src) 97.8 F (36.6 C)  Ht '5\' 4"'  (1.626 m)  Wt 199 lb (90.266 kg)  BMI 34.14 kg/m2 No LMP recorded. Patient is postmenopausal. Psych:  Alert and cooperative. Normal mood and affect. General:   Alert,  Well-developed, well-nourished, pleasant and cooperative in NAD Head:  Normocephalic and atraumatic. Eyes:  Sclera clear, no icterus.   Conjunctiva pink. Ears:  Normal auditory acuity. Nose:  No deformity, discharge, or lesions. Mouth:  No deformity or lesions,oropharynx pink & moist. Neck:  Supple; no masses or thyromegaly. Lungs:  Respirations even and unlabored.  Clear throughout to auscultation.   No wheezes, crackles, or rhonchi. No acute distress. Heart:  Regular rate and rhythm; no murmurs, clicks, rubs, or gallops. Abdomen:  Normal bowel sounds.  No bruits.  Soft, non-tender and non-distended without masses, hepatosplenomegaly or hernias noted.  No guarding or rebound tenderness.  Negative Carnett sign.   Rectal:  Deferred.  Msk:  Symmetrical without gross deformities.  Good, equal movement & strength bilaterally. Pulses:  Normal pulses noted. Extremities:  No clubbing or edema.  No cyanosis. Neurologic:  Alert and oriented x3;  grossly normal neurologically. Skin:  Intact without significant lesions or rashes.  No jaundice. Lymph Nodes:  No significant cervical adenopathy. Psych:  Alert and cooperative. Normal mood and affect.  Imaging Studies: No results found.  Assessment and Plan:   ARRION BURRUEL is a 45 y.o. y/o female who comes in today with an isolated alkaline phosphatase elevation. The patient's  alk phosphatase was 183. The patient will have her blood sent off for alkaline phosphatase isoenzyme. She will also have her blood checked for antimitochondrial antibody to rule out primary biliary cirrhosis. The patient does report that at times she has itching. The she will also have her labs checked for ANA and SMA. Will also have her blood checked for GGT. The patient has been explained the plan and agrees with it.   Note: This dictation was prepared with Dragon dictation along with smaller phrase technology. Any transcriptional errors that result from this process are unintentional.

## 2015-06-10 LAB — MITOCHONDRIAL ANTIBODIES: MITOCHONDRIAL M2 AB, IGG: 19.8 U (ref 0.0–20.0)

## 2015-06-10 LAB — MISC LABCORP TEST (SEND OUT)
LABCORP TEST CODE: 1612
Labcorp test code: 340897

## 2015-06-10 LAB — ANTI-SMOOTH MUSCLE ANTIBODY, IGG: F-ACTIN AB IGG: 11 U (ref 0–19)

## 2015-06-21 ENCOUNTER — Telehealth: Payer: Self-pay | Admitting: Gastroenterology

## 2015-06-21 NOTE — Telephone Encounter (Signed)
Patient called and would like to know if Dr. Allen Norris has looked at her results yet?

## 2015-06-23 ENCOUNTER — Encounter: Payer: Self-pay | Admitting: Family Medicine

## 2015-06-24 NOTE — Telephone Encounter (Signed)
Patient called back today for results

## 2015-06-24 NOTE — Telephone Encounter (Signed)
Pt has called requesting results. Please advise.

## 2015-06-27 NOTE — Telephone Encounter (Signed)
-----   Message from Lucilla Lame, MD sent at 06/27/2015  1:07 PM EST ----- The patient know that the blood test showed that the elevated enzyme is likely coming from her liver. The cause of all the other tests being normal it may be due to one of the medication she is taking. Medications will be reviewed and if there are any on her list that may be her problem she will be contacted.

## 2015-06-27 NOTE — Telephone Encounter (Signed)
Pt notified of lab results

## 2015-07-03 ENCOUNTER — Other Ambulatory Visit: Payer: Self-pay | Admitting: Family Medicine

## 2015-07-05 ENCOUNTER — Other Ambulatory Visit: Payer: Self-pay | Admitting: Family Medicine

## 2015-07-05 NOTE — Telephone Encounter (Signed)
I just approved refills two days ago; please resolve with pharmacy

## 2015-07-06 NOTE — Telephone Encounter (Signed)
Pharmacy has rx ready.

## 2015-07-11 ENCOUNTER — Encounter: Payer: Self-pay | Admitting: Family Medicine

## 2015-07-11 ENCOUNTER — Other Ambulatory Visit: Payer: Self-pay | Admitting: Family Medicine

## 2015-07-11 MED ORDER — ALBUTEROL SULFATE HFA 108 (90 BASE) MCG/ACT IN AERS: 2.0000 | INHALATION_SPRAY | RESPIRATORY_TRACT | Status: DC | PRN

## 2015-07-11 NOTE — Telephone Encounter (Signed)
Routing to provider  

## 2015-07-11 NOTE — Telephone Encounter (Signed)
rx approved

## 2015-07-11 NOTE — Telephone Encounter (Signed)
Pt needs refill on albuterol sent to [facility] [place]

## 2015-07-19 ENCOUNTER — Encounter: Payer: Self-pay | Admitting: Family Medicine

## 2015-09-08 ENCOUNTER — Telehealth: Payer: Self-pay | Admitting: Family Medicine

## 2015-09-08 MED ORDER — GABAPENTIN 800 MG PO TABS: 800.0000 mg | ORAL_TABLET | Freq: Three times a day (TID) | ORAL | Status: DC

## 2015-09-08 MED ORDER — MELOXICAM 15 MG PO TABS: 15.0000 mg | ORAL_TABLET | Freq: Every day | ORAL | Status: DC

## 2015-09-08 NOTE — Telephone Encounter (Signed)
gabapentin (NEURONTIN) 800 MG tablet meloxicam (MOBIC) 15 MG tablet  Pharmacy: [facility] in [place], [place]  Patient needs refill on her medication sent to her pharmacy.

## 2015-11-11 ENCOUNTER — Other Ambulatory Visit: Payer: Self-pay | Admitting: Family Medicine

## 2015-11-11 NOTE — Telephone Encounter (Signed)
Can you please resend to the new updated pharmacy

## 2015-11-11 NOTE — Telephone Encounter (Signed)
Please find out if patient is following me to [facility] or staying at [facility]; refill will go to wherever she will be seen If she is going to see me, I need to her within the next month for further refills

## 2015-11-11 NOTE — Telephone Encounter (Signed)
REFILL ON GABAPENTIN 800MG  AND MOBIC 15 MG. PT IS TOTALLY OUT. PHARM IS [place]  . PHONE # [phone] .

## 2015-11-14 ENCOUNTER — Other Ambulatory Visit: Payer: Self-pay

## 2015-11-14 NOTE — Telephone Encounter (Signed)
Denied; patient needs an appt; I believe she has moved

## 2015-11-15 NOTE — Telephone Encounter (Signed)
Pt scheduled  

## 2015-11-22 ENCOUNTER — Ambulatory Visit: Payer: Self-pay | Admitting: Family Medicine

## 2015-11-24 DIAGNOSIS — F1011 Alcohol abuse, in remission: Secondary | ICD-10-CM | POA: Insufficient documentation

## 2015-11-24 DIAGNOSIS — N183 Chronic kidney disease, stage 3 unspecified: Secondary | ICD-10-CM | POA: Insufficient documentation

## 2015-11-24 DIAGNOSIS — F4321 Adjustment disorder with depressed mood: Secondary | ICD-10-CM | POA: Insufficient documentation

## 2015-11-24 DIAGNOSIS — F1010 Alcohol abuse, uncomplicated: Secondary | ICD-10-CM | POA: Insufficient documentation

## 2015-11-25 MED ORDER — GABAPENTIN 800 MG PO TABS: 800.0000 mg | ORAL_TABLET | Freq: Three times a day (TID) | ORAL | 0 refills | Status: DC

## 2015-12-19 ENCOUNTER — Ambulatory Visit: Payer: Self-pay | Admitting: Family Medicine

## 2017-09-20 ENCOUNTER — Encounter: Payer: Self-pay | Admitting: Family Medicine

## 2017-09-20 ENCOUNTER — Ambulatory Visit: Payer: Self-pay | Admitting: Family Medicine

## 2017-09-20 VITALS — BP 142/82 | HR 77 | Temp 98.3°F | Resp 16 | Ht 62.5 in | Wt 152.8 lb

## 2017-09-20 DIAGNOSIS — R05 Cough: Secondary | ICD-10-CM

## 2017-09-20 DIAGNOSIS — R252 Cramp and spasm: Secondary | ICD-10-CM

## 2017-09-20 DIAGNOSIS — D7589 Other specified diseases of blood and blood-forming organs: Secondary | ICD-10-CM

## 2017-09-20 DIAGNOSIS — R634 Abnormal weight loss: Secondary | ICD-10-CM

## 2017-09-20 DIAGNOSIS — Z720 Tobacco use: Secondary | ICD-10-CM

## 2017-09-20 DIAGNOSIS — R059 Cough, unspecified: Secondary | ICD-10-CM

## 2017-09-20 DIAGNOSIS — F1021 Alcohol dependence, in remission: Secondary | ICD-10-CM

## 2017-09-20 DIAGNOSIS — K7010 Alcoholic hepatitis without ascites: Secondary | ICD-10-CM

## 2017-09-20 DIAGNOSIS — R739 Hyperglycemia, unspecified: Secondary | ICD-10-CM

## 2017-09-20 DIAGNOSIS — J449 Chronic obstructive pulmonary disease, unspecified: Secondary | ICD-10-CM

## 2017-09-20 DIAGNOSIS — E559 Vitamin D deficiency, unspecified: Secondary | ICD-10-CM

## 2017-09-20 DIAGNOSIS — G621 Alcoholic polyneuropathy: Secondary | ICD-10-CM

## 2017-09-20 DIAGNOSIS — I10 Essential (primary) hypertension: Secondary | ICD-10-CM

## 2017-09-20 DIAGNOSIS — Z1239 Encounter for other screening for malignant neoplasm of breast: Secondary | ICD-10-CM

## 2017-09-20 DIAGNOSIS — R748 Abnormal levels of other serum enzymes: Secondary | ICD-10-CM

## 2017-09-20 DIAGNOSIS — F17210 Nicotine dependence, cigarettes, uncomplicated: Secondary | ICD-10-CM

## 2017-09-20 DIAGNOSIS — Z1231 Encounter for screening mammogram for malignant neoplasm of breast: Secondary | ICD-10-CM

## 2017-09-20 MED ORDER — BUDESONIDE-FORMOTEROL FUMARATE 160-4.5 MCG/ACT IN AERO: 2.0000 | INHALATION_SPRAY | Freq: Two times a day (BID) | RESPIRATORY_TRACT | 11 refills | Status: DC

## 2017-09-20 MED ORDER — AMITRIPTYLINE HCL 25 MG PO TABS: 25.0000 mg | ORAL_TABLET | Freq: Every evening | ORAL | 2 refills | Status: DC | PRN

## 2017-09-20 MED ORDER — ALBUTEROL SULFATE HFA 108 (90 BASE) MCG/ACT IN AERS: 2.0000 | INHALATION_SPRAY | RESPIRATORY_TRACT | 3 refills | Status: DC | PRN

## 2017-09-20 MED ORDER — OMEPRAZOLE 10 MG PO CPDR: 10.0000 mg | DELAYED_RELEASE_CAPSULE | Freq: Every day | ORAL | 5 refills | Status: DC

## 2017-09-20 MED ORDER — AMLODIPINE BESYLATE 5 MG PO TABS: 5.0000 mg | ORAL_TABLET | Freq: Every day | ORAL | 11 refills | Status: DC

## 2017-09-20 MED ORDER — GABAPENTIN 300 MG PO CAPS: ORAL_CAPSULE | ORAL | 0 refills | Status: DC

## 2017-09-20 NOTE — Progress Notes (Signed)
BP (!) 142/82   Pulse 77   Temp 98.3 F (36.8 C) (Oral)   Resp 16   Ht 5' 2.5" (1.588 m)   Wt 152 lb 12.8 oz (69.3 kg)   SpO2 98%   BMI 27.50 kg/m    Subjective:    Patient ID: Beth [name], female    DOB: [birth year], 47 y.o.   MRN: [id number]  HPI: Beth [name] is a 47 y.o. female  Chief Complaint  Patient presents with  . Follow-up  . Medication Refill  . Arm Pain    right, onset 1 month.  Pt states can not sleep on that side nor raise above head.  Also making her neck stiff    HPI Patient is here to re-establish; last visit with me was 04/28/2015 She has not been taking any medicines Brother died in 07/12/2022; thought he had a cold and he went to [facility] and organ failure and died  HTN; not checking BP; loves her salt, trying to use less; no decongestants; plain tylenol today, but ibuprofen yesterday; still smoking Drinking just one day a week, 3-4 on that day and that is all; the last time she lost that much weight was because of red blood cells; that was 7 years ago; [place] yes, down to 130 pounds; no recent labs; no fam hx of thyroid to her knowledge No blood in the stool or urine Cough, hacking up some, no blood  She was on the amitriptyline for headaches; wishes to start back; helped her sleep Gabapentin was for neuropathy in legs; chronic condition; saw neuropathy, lots of testing, never discovered the reason; was thought to be from alcohol; used to be waist down and now knee down; can feel hands, but better than before  Thumb and index fingers cramping; toes are not curling up; no cramps in the calves  She is real tender right in the back of the right neck; feels like a knife and having trouble raising arm up or reaching around behind; pain in the right shoulder   Depression screen [facility] 2/9 09/20/2017  Decreased Interest 0  Down, Depressed, Hopeless 1  PHQ - 2 Score 1    Relevant past medical, surgical, family and social history reviewed Past Medical  History:  Diagnosis Date  . Abnormal gamma-glutamyl transferase test   . Adjustment disorder with mixed anxiety and depressed mood   . Alcohol abuse   . Alcoholic peripheral neuropathy ([place])   . Anemia   . Arthritis   . Asthma   . Cardiac murmur   . Chronic alcoholic hepatitis   . Chronic alcoholism ([place])   . Chronic alcoholism in remission ([place])   . Chronic back pain   . COPD (chronic obstructive pulmonary disease) ([place])   . Elevated liver enzymes Jan 2014   referred to and seen by GI  . Fatty liver   . GERD (gastroesophageal reflux disease)   . Hiatal hernia   . Hypertension   . Macrocytosis    alcoholic  . Ruptured tubal pregnancy   . Tobacco abuse   . Vitamin D deficiency disease    Past Surgical History:  Procedure Laterality Date  . FINGER SURGERY    . LAPAROSCOPIC OOPHERECTOMY Right   . TUBAL LIGATION  1992   Family History  Problem Relation Age of Onset  . Cancer Mother        lung  . Kidney failure Father   . Diabetes Father   . Hypertension Father   .  Heart disease Father   . Heart attack Father   . Cancer Brother        lung, liver  . Diabetes Brother   . Hypertension Brother   . Cancer Brother        lung, brain  . Hypertension Sister   . Diabetes Maternal Grandmother   . Hypertension Maternal Grandmother   . Hypertension Maternal Grandfather   . Hypertension Paternal Grandmother   . Hypertension Paternal Grandfather    Social History   Tobacco Use  . Smoking status: Current Every Day Smoker    Packs/day: 1.00    Years: 25.00    Pack years: 25.00    Types: Cigarettes  . Smokeless tobacco: Never Used  Substance Use Topics  . Alcohol use: Yes    Comment: very seldom  . Drug use: No    Interim medical history since last visit reviewed. Allergies and medications reviewed  Review of Systems Per HPI unless specifically indicated above     Objective:    BP (!) 142/82   Pulse 77   Temp 98.3 F (36.8 C) (Oral)   Resp 16   Ht 5'  2.5" (1.588 m)   Wt 152 lb 12.8 oz (69.3 kg)   SpO2 98%   BMI 27.50 kg/m   Wt Readings from Last 3 Encounters:  09/20/17 152 lb 12.8 oz (69.3 kg)  06/09/15 199 lb (90.3 kg)  04/28/15 193 lb (87.5 kg)    Physical Exam  Constitutional: She appears well-developed and well-nourished. No distress.  Weight loss over last 2+ years noted  HENT:  Head: Normocephalic and atraumatic.  Eyes: EOM are normal. No scleral icterus.  Neck: No thyromegaly present.  Cardiovascular: Normal rate, regular rhythm and normal heart sounds.  No murmur heard. Pulmonary/Chest: Effort normal and breath sounds normal. No respiratory distress. She has no wheezes.  Abdominal: Soft. Bowel sounds are normal. She exhibits no distension.  Musculoskeletal: Normal range of motion. She exhibits no edema.  Neurological: She is alert. She exhibits normal muscle tone.  Skin: Skin is warm and dry. She is not diaphoretic. No pallor.  Erythema diffusely on sun-exposed skin with peeling c/w recent sunburn  Psychiatric: She has a normal mood and affect. Her behavior is normal. Judgment and thought content normal.       Assessment & Plan:   Problem List Items Addressed This Visit      Cardiovascular and Mediastinum   Hypertension - Primary   Relevant Medications   amLODipine (NORVASC) 5 MG tablet     Respiratory   COPD (chronic obstructive pulmonary disease) (HCC)    Encouraged smoking cessation      Relevant Medications   budesonide-formoterol (SYMBICORT) 160-4.5 MCG/ACT inhaler   albuterol (PROVENTIL HFA;VENTOLIN HFA) 108 (90 Base) MCG/ACT inhaler     Digestive   Chronic alcoholic hepatitis    Check labs today        Nervous and Auditory   Alcoholic peripheral neuropathy (HCC)    Patient states that she only drinks 3-4 alcoholic drinks per week now, one occasion; neuropathy already worked up by neurologist      Relevant Medications   amitriptyline (ELAVIL) 25 MG tablet   gabapentin (NEURONTIN) 300 MG  capsule     Other   Vitamin D deficiency disease   Relevant Orders   VITAMIN D 25 Hydroxy (Vit-D Deficiency, Fractures) (Completed)   Elevated liver enzymes   Relevant Orders   COMPLETE METABOLIC PANEL WITH GFR (Completed)   Macrocytosis  Relevant Orders   CBC with Differential/Platelet (Completed)   Chronic alcoholism in remission ([place])   Tobacco use   Relevant Orders   CT Chest W Contrast   Tobacco abuse    Encouraged cessation       Other Visit Diagnoses    Hyperglycemia       Relevant Orders   Hemoglobin A1c (Completed)   Cough       Cigarette smoker       Hand cramps       check electrolytes   Relevant Orders   Magnesium (Completed)   Weight loss       Relevant Orders   TSH (Completed)   Abnormal weight loss       discussed with patient, concern for malignancy in smoker; will get chest CT, labs; close f/u   Relevant Orders   CT Chest W Contrast   Screening for breast cancer       Relevant Orders   MM Digital Screening       Follow up plan: Return in about 2 weeks (around 10/04/2017) for follow-up visit with Dr. Sanda Klein.  An after-visit summary was printed and given to the patient at [facility].  Please see the patient instructions which may contain other information and recommendations beyond what is mentioned above in the assessment and plan.  Meds ordered this encounter  Medications  . budesonide-formoterol (SYMBICORT) 160-4.5 MCG/ACT inhaler    Sig: Inhale 2 puffs into the lungs 2 (two) times daily. Reported on 04/28/2015    Dispense:  1 Inhaler    Refill:  11  . amLODipine (NORVASC) 5 MG tablet    Sig: Take 1 tablet (5 mg total) by mouth daily.    Dispense:  30 tablet    Refill:  11  . albuterol (PROVENTIL HFA;VENTOLIN HFA) 108 (90 Base) MCG/ACT inhaler    Sig: Inhale 2 puffs into the lungs every 4 (four) hours as needed for wheezing or shortness of breath.    Dispense:  1 Inhaler    Refill:  3  . amitriptyline (ELAVIL) 25 MG tablet    Sig: Take 1  tablet (25 mg total) by mouth at bedtime as needed for sleep.    Dispense:  30 tablet    Refill:  2    Please schedule appt.  Thank you.  Marland Kitchen omeprazole (PRILOSEC) 10 MG capsule    Sig: Take 1 capsule (10 mg total) by mouth daily.    Dispense:  30 capsule    Refill:  5  . gabapentin (NEURONTIN) 300 MG capsule    Sig: One by mouth at bedtime for 3 nights, then one twice a day for 3 days, then one three times a day    Dispense:  90 capsule    Refill:  0    Orders Placed This Encounter  Procedures  . MM Digital Screening  . CT Chest W Contrast  . TSH  . CBC with Differential/Platelet  . COMPLETE METABOLIC PANEL WITH GFR  . Magnesium  . Hemoglobin A1c  . VITAMIN D 25 Hydroxy (Vit-D Deficiency, Fractures)

## 2017-09-20 NOTE — Patient Instructions (Addendum)
Please call [phone] to schedule your imaging test Please wait 2-3 days after the order has been placed to call and get your test scheduled Let's get labs today If the scan is negative, we'll get you to the orthopaedist Return in 2 weeks I do encourage you to quit smoking Call [phone] to sign up for smoking cessation classes You can call 1-800-QUIT-NOW to talk with a smoking cessation coach   Health Risks of Smoking Smoking cigarettes is very bad for your health. Tobacco smoke has over 200 known poisons in it. It contains the poisonous gases nitrogen oxide and carbon monoxide. There are over 60 chemicals in tobacco smoke that cause cancer. Smoking is difficult to quit because a chemical in tobacco, called nicotine, causes addiction or dependence. When you smoke and inhale, nicotine is absorbed rapidly into the bloodstream through your lungs. Both inhaled and non-inhaled nicotine may be addictive. What are the risks of cigarette smoke? Cigarette smokers have an increased risk of many serious medical problems, including:  Lung cancer.  Lung disease, such as pneumonia, bronchitis, and emphysema.  Chest pain (angina) and heart attack because the heart is not getting enough oxygen.  Heart disease and peripheral blood vessel disease.  High blood pressure (hypertension).  Stroke.  Oral cancer, including cancer of the lip, mouth, or voice box.  Bladder cancer.  Pancreatic cancer.  Cervical cancer.  Pregnancy complications, including premature birth.  Stillbirths and smaller newborn babies, birth defects, and genetic damage to sperm.  Early menopause.  Lower estrogen level for women.  Infertility.  Facial wrinkles.  Blindness.  Increased risk of broken bones (fractures).  Senile dementia.  Stomach ulcers and internal bleeding.  Delayed wound healing and increased risk of complications during surgery.  Even smoking lightly shortens your life expectancy by  several years.  Because of secondhand smoke exposure, children of smokers have an increased risk of the following:  Sudden infant death syndrome (SIDS).  Respiratory infections.  Lung cancer.  Heart disease.  Ear infections.  What are the benefits of quitting? There are many health benefits of quitting smoking. Here are some of them:  Within days of quitting smoking, your risk of having a heart attack decreases, your blood flow improves, and your lung capacity improves. Blood pressure, pulse rate, and breathing patterns start returning to normal soon after quitting.  Within months, your lungs may clear up completely.  Quitting for 10 years reduces your risk of developing lung cancer and heart disease to almost that of a nonsmoker.  People who quit may see an improvement in their overall quality of life.  How do I quit smoking? Smoking is an addiction with both physical and psychological effects, and longtime habits can be hard to change. Your health care provider can recommend:  Programs and community resources, which may include group support, education, or talk therapy.  Prescription medicines to help reduce cravings.  Nicotine replacement products, such as patches, gum, and nasal sprays. Use these products only as directed. Do not replace cigarette smoking with electronic cigarettes, which are commonly called e-cigarettes. The safety of e-cigarettes is not known, and some may contain harmful chemicals.  A combination of two or more of these methods.  Where to find more information:  American Lung Association: www.lung.org  American Cancer Society: www.cancer.org Summary  Smoking cigarettes is very bad for your health. Cigarette smokers have an increased risk of many serious medical problems, including several cancers, heart disease, and stroke.  Smoking is an addiction with  both physical and psychological effects, and longtime habits can be hard to change.  By  stopping right away, you can greatly reduce the risk of medical problems for you and your family.  To help you quit smoking, your health care provider can recommend programs, community resources, prescription medicines, and nicotine replacement products such as patches, gum, and nasal sprays. This information is not intended to replace advice given to you by your health care provider. Make sure you discuss any questions you have with your health care provider. Document Released: 05/24/2004 Document Revised: 04/20/2016 Document Reviewed: 04/20/2016 Elsevier Interactive Patient Education  2017 [employer].

## 2017-09-21 LAB — CBC WITH DIFFERENTIAL/PLATELET
BASOS PCT: 1 %
Basophils Absolute: 105 cells/uL (ref 0–200)
EOS PCT: 1.4 %
Eosinophils Absolute: 147 cells/uL (ref 15–500)
HEMATOCRIT: 45.9 % — AB (ref 35.0–45.0)
HEMOGLOBIN: 16.2 g/dL — AB (ref 11.7–15.5)
LYMPHS ABS: 3570 {cells}/uL (ref 850–3900)
MCH: 33.4 pg — ABNORMAL HIGH (ref 27.0–33.0)
MCHC: 35.3 g/dL (ref 32.0–36.0)
MCV: 94.6 fL (ref 80.0–100.0)
MPV: 9.5 fL (ref 7.5–12.5)
Monocytes Relative: 5 %
NEUTROS ABS: 6153 {cells}/uL (ref 1500–7800)
Neutrophils Relative %: 58.6 %
Platelets: 285 10*3/uL (ref 140–400)
RBC: 4.85 10*6/uL (ref 3.80–5.10)
RDW: 13 % (ref 11.0–15.0)
Total Lymphocyte: 34 %
WBC mixed population: 525 cells/uL (ref 200–950)
WBC: 10.5 10*3/uL (ref 3.8–10.8)

## 2017-09-21 LAB — HEMOGLOBIN A1C
EAG (MMOL/L): 5.5 (calc)
HEMOGLOBIN A1C: 5.1 %{Hb} (ref <5.7)
Mean Plasma Glucose: 100 (calc)

## 2017-09-21 LAB — COMPLETE METABOLIC PANEL WITH GFR
AG Ratio: 1.8 (calc) (ref 1.0–2.5)
ALBUMIN MSPROF: 4.4 g/dL (ref 3.6–5.1)
ALT: 14 U/L (ref 6–29)
AST: 19 U/L (ref 10–35)
Alkaline phosphatase (APISO): 72 U/L (ref 33–115)
BUN / CREAT RATIO: 18 (calc) (ref 6–22)
BUN: 21 mg/dL (ref 7–25)
CALCIUM: 9.2 mg/dL (ref 8.6–10.2)
CO2: 26 mmol/L (ref 20–32)
CREATININE: 1.15 mg/dL — AB (ref 0.50–1.10)
Chloride: 103 mmol/L (ref 98–110)
GFR, EST NON AFRICAN AMERICAN: 56 mL/min/{1.73_m2} — AB (ref >=60)
GFR, Est African American: 65 mL/min/{1.73_m2} (ref >=60)
Globulin: 2.4 g/dL (calc) (ref 1.9–3.7)
Glucose, Bld: 98 mg/dL (ref 65–139)
Potassium: 4.4 mmol/L (ref 3.5–5.3)
SODIUM: 139 mmol/L (ref 135–146)
Total Bilirubin: 0.3 mg/dL (ref 0.2–1.2)
Total Protein: 6.8 g/dL (ref 6.1–8.1)

## 2017-09-21 LAB — VITAMIN D 25 HYDROXY (VIT D DEFICIENCY, FRACTURES): Vit D, 25-Hydroxy: 25 ng/mL — ABNORMAL LOW (ref 30–100)

## 2017-09-21 LAB — TSH: TSH: 0.72 mIU/L

## 2017-09-21 LAB — MAGNESIUM: Magnesium: 2.2 mg/dL (ref 1.5–2.5)

## 2017-09-25 DIAGNOSIS — F17220 Nicotine dependence, chewing tobacco, uncomplicated: Secondary | ICD-10-CM | POA: Insufficient documentation

## 2017-09-25 DIAGNOSIS — F17210 Nicotine dependence, cigarettes, uncomplicated: Secondary | ICD-10-CM | POA: Insufficient documentation

## 2017-09-25 DIAGNOSIS — Z720 Tobacco use: Secondary | ICD-10-CM | POA: Insufficient documentation

## 2017-09-25 NOTE — Assessment & Plan Note (Signed)
Encouraged cessation.

## 2017-09-25 NOTE — Assessment & Plan Note (Signed)
Check labs today.

## 2017-09-25 NOTE — Assessment & Plan Note (Signed)
Patient states that she only drinks 3-4 alcoholic drinks per week now, one occasion; neuropathy already worked up by neurologist

## 2017-09-25 NOTE — Assessment & Plan Note (Signed)
Encouraged smoking cessation 

## 2017-09-26 ENCOUNTER — Ambulatory Visit
Admission: RE | Admit: 2017-09-26 | Discharge: 2017-09-26 | Disposition: A | Discharge status: Home / Self Care | Payer: Self-pay | Source: Ambulatory Visit | Attending: Family Medicine | Admitting: Family Medicine

## 2017-09-26 DIAGNOSIS — M4804 Spinal stenosis, thoracic region: Secondary | ICD-10-CM | POA: Insufficient documentation

## 2017-09-26 DIAGNOSIS — M5124 Other intervertebral disc displacement, thoracic region: Secondary | ICD-10-CM | POA: Insufficient documentation

## 2017-09-26 DIAGNOSIS — E279 Disorder of adrenal gland, unspecified: Secondary | ICD-10-CM | POA: Insufficient documentation

## 2017-09-26 DIAGNOSIS — Z720 Tobacco use: Secondary | ICD-10-CM

## 2017-09-26 DIAGNOSIS — R634 Abnormal weight loss: Secondary | ICD-10-CM

## 2017-09-26 MED ORDER — IOPAMIDOL (ISOVUE-300) INJECTION 61%
100.0000 mL | Freq: Once | INTRAVENOUS | Status: AC | PRN
  Administered 2017-09-26: 75 mL via INTRAVENOUS

## 2017-09-30 ENCOUNTER — Encounter: Payer: Self-pay | Admitting: Family Medicine

## 2017-10-01 ENCOUNTER — Ambulatory Visit: Payer: Self-pay | Admitting: Family Medicine

## 2017-10-01 ENCOUNTER — Encounter: Payer: Self-pay | Admitting: Family Medicine

## 2017-10-01 ENCOUNTER — Other Ambulatory Visit: Payer: Self-pay | Admitting: Family Medicine

## 2017-10-01 DIAGNOSIS — M4804 Spinal stenosis, thoracic region: Secondary | ICD-10-CM

## 2017-10-01 DIAGNOSIS — E278 Other specified disorders of adrenal gland: Secondary | ICD-10-CM | POA: Insufficient documentation

## 2017-10-01 DIAGNOSIS — E279 Disorder of adrenal gland, unspecified: Secondary | ICD-10-CM

## 2017-10-01 DIAGNOSIS — M5124 Other intervertebral disc displacement, thoracic region: Secondary | ICD-10-CM

## 2017-10-01 HISTORY — DX: Other specified disorders of adrenal gland: E27.8

## 2017-10-01 HISTORY — DX: Spinal stenosis, thoracic region: M48.04

## 2017-10-01 HISTORY — DX: Other intervertebral disc displacement, thoracic region: M51.24

## 2017-10-01 HISTORY — DX: Disorder of adrenal gland, unspecified: E27.9

## 2017-10-01 NOTE — Telephone Encounter (Signed)
Pt called to check the status of request

## 2017-10-01 NOTE — Progress Notes (Signed)
Refer to neurosurg Reminder to order CT adrenal protocol

## 2017-10-02 ENCOUNTER — Encounter: Payer: Self-pay | Admitting: Family Medicine

## 2017-10-18 ENCOUNTER — Ambulatory Visit (INDEPENDENT_AMBULATORY_CARE_PROVIDER_SITE_OTHER): Payer: Self-pay | Admitting: Family Medicine

## 2017-10-18 ENCOUNTER — Encounter: Payer: Self-pay | Admitting: Family Medicine

## 2017-10-18 ENCOUNTER — Encounter

## 2017-10-18 VITALS — BP 136/80 | HR 84 | Temp 98.3°F | Resp 14 | Ht 63.0 in | Wt 153.7 lb

## 2017-10-18 DIAGNOSIS — G621 Alcoholic polyneuropathy: Secondary | ICD-10-CM

## 2017-10-18 DIAGNOSIS — Z720 Tobacco use: Secondary | ICD-10-CM

## 2017-10-18 DIAGNOSIS — N289 Disorder of kidney and ureter, unspecified: Secondary | ICD-10-CM

## 2017-10-18 DIAGNOSIS — I10 Essential (primary) hypertension: Secondary | ICD-10-CM

## 2017-10-18 DIAGNOSIS — E559 Vitamin D deficiency, unspecified: Secondary | ICD-10-CM

## 2017-10-18 DIAGNOSIS — D582 Other hemoglobinopathies: Secondary | ICD-10-CM

## 2017-10-18 NOTE — Patient Instructions (Addendum)
On the nights when you take Baclofen, don't take the Elavil (amitriptyline) We'll limit the ibuprofen to no more than 800 mg a day Try to find other means for dealing with stress other than smoking or vaping  I do encourage you to quit smoking Call [phone] to sign up for smoking cessation classes You can call 1-800-QUIT-NOW to talk with a smoking cessation coach   Steps to Quit Smoking Smoking tobacco can be bad for your health. It can also affect almost every organ in your body. Smoking puts you and people around you at risk for many serious long-lasting (chronic) diseases. Quitting smoking is hard, but it is one of the best things that you can do for your health. It is never too late to quit. What are the benefits of quitting smoking? When you quit smoking, you lower your risk for getting serious diseases and conditions. They can include:  Lung cancer or lung disease.  Heart disease.  Stroke.  Heart attack.  Not being able to have children (infertility).  Weak bones (osteoporosis) and broken bones (fractures).  If you have coughing, wheezing, and shortness of breath, those symptoms may get better when you quit. You may also get sick less often. If you are pregnant, quitting smoking can help to lower your chances of having a baby of low birth weight. What can I do to help me quit smoking? Talk with your doctor about what can help you quit smoking. Some things you can do (strategies) include:  Quitting smoking totally, instead of slowly cutting back how much you smoke over a period of time.  Going to in-person counseling. You are more likely to quit if you go to many counseling sessions.  Using resources and support systems, such as: ? Database administrator with a Social worker. ? Phone quitlines. ? Careers information officer. ? Support groups or group counseling. ? Text messaging programs. ? Mobile phone apps or applications.  Taking medicines. Some of these medicines may have  nicotine in them. If you are pregnant or breastfeeding, do not take any medicines to quit smoking unless your doctor says it is okay. Talk with your doctor about counseling or other things that can help you.  Talk with your doctor about using more than one strategy at the same time, such as taking medicines while you are also going to in-person counseling. This can help make quitting easier. What things can I do to make it easier to quit? Quitting smoking might feel very hard at first, but there is a lot that you can do to make it easier. Take these steps:  Talk to your family and friends. Ask them to support and encourage you.  Call phone quitlines, reach out to support groups, or work with a Social worker.  Ask people who smoke to not smoke around you.  Avoid places that make you want (trigger) to smoke, such as: ? Bars. ? Parties. ? Smoke-break areas at work.  Spend time with people who do not smoke.  Lower the stress in your life. Stress can make you want to smoke. Try these things to help your stress: ? Getting regular exercise. ? Deep-breathing exercises. ? Yoga. ? Meditating. ? Doing a body scan. To do this, close your eyes, focus on one area of your body at a time from head to toe, and notice which parts of your body are tense. Try to relax the muscles in those areas.  Download or buy apps on your mobile phone or tablet that can  help you stick to your quit plan. There are many free apps, such as QuitGuide from the CDC (Centers for Disease Control and Prevention). You can find more support from smokefree.gov and other websites.  This information is not intended to replace advice given to you by your health care provider. Make sure you discuss any questions you have with your health care provider. Document Released: 02/10/2009 Document Revised: 12/13/2015 Document Reviewed: 08/31/2014 Elsevier Interactive Patient Education  2018 [employer]  

## 2017-10-18 NOTE — Progress Notes (Signed)
BP 136/80   Pulse 84   Temp 98.3 F (36.8 C) (Oral)   Resp 14   Ht 5\' 3"  (1.6 m)   Wt 153 lb 11.2 oz (69.7 kg)   SpO2 94%   BMI 27.23 kg/m    Subjective:    Patient ID: Beth [name], female    DOB: 10-May-1970, 47 y.o.   MRN: [id number]  HPI: Beth [name] is a 47 y.o. female  Chief Complaint  Patient presents with  . Follow-up    HPI Patient is here for f/u Saw the specialist; going to get MRI but awaiting Medicaid approval Gave her baclofen; steroid pack; no issues with the prednisone Labs reviewed from 09/20/2017  Vitamin D was low at 25; taking vitamin supplement OTC Normal A1c, normal Mg2+, normal TSH  Creatinine was 1.15, GFR was 56; she had been taking ibuprofen like candy but cut back; drinks 2-3 bottles of water a day;  H/H were high at 16.2/45.9 She is a smoker; more than 1/2 ppd depending on how the day goes; other than smoking, we talked about what she could do besides smoking; she is thinking about vaping Not sure about sleep apnea No snoring  Blood pressure; controlled with just amlodipine; tries to stay away from excess sodium; started drinking diet Pepsi when she was told she had high sugars but not diabetes   Depression screen PHQ 2/9 09/20/2017  Decreased Interest 0  Down, Depressed, Hopeless 1  PHQ - 2 Score 1    Relevant past medical, surgical, family and social history reviewed Past Medical History:  Diagnosis Date  . Abnormal gamma-glutamyl transferase test   . Adjustment disorder with mixed anxiety and depressed mood   . Adrenal nodule ([place]) 10/01/2017   Due for repeat CT scan June 2020  . Alcohol abuse   . Alcoholic peripheral neuropathy ([place])   . Anemia   . Arthritis   . Asthma   . Cardiac murmur   . Chronic alcoholic hepatitis   . Chronic alcoholism ([place])   . Chronic alcoholism in remission ([place])   . Chronic back pain   . COPD (chronic obstructive pulmonary disease) ([place])   . Elevated liver enzymes Jan 2014   referred to and  seen by GI  . Fatty liver   . GERD (gastroesophageal reflux disease)   . Hiatal hernia   . Hypertension   . Macrocytosis    alcoholic  . Ruptured tubal pregnancy   . 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 Thoracic spinal stenosis 10/01/2017  . Tobacco abuse   . Vitamin D deficiency disease    Past Surgical History:  Procedure Laterality Date  . FINGER SURGERY    . LAPAROSCOPIC OOPHERECTOMY Right   . TUBAL LIGATION  1992   Family History  Problem Relation Age of Onset  . Cancer Mother        lung  . Kidney failure Father   . Diabetes Father   . Hypertension Father   . Heart disease Father   . Heart attack Father   . Cancer Brother        lung, liver  . Diabetes Brother   . Hypertension Brother   . Cancer Brother        lung, brain  . Hypertension Sister   . Diabetes Maternal Grandmother   . Hypertension Maternal Grandmother   . Hypertension Maternal Grandfather   . Hypertension Paternal Grandmother   . Hypertension Paternal Grandfather    Social History  Tobacco Use  . Smoking status: Current Every Day Smoker    Packs/day: 1.00    Years: 25.00    Pack years: 25.00    Types: Cigarettes  . Smokeless tobacco: Never Used  Substance Use Topics  . Alcohol use: Yes    Comment: very seldom  . Drug use: No    Interim medical history since last visit reviewed. Allergies and medications reviewed  Review of Systems Per HPI unless specifically indicated above     Objective:    BP 136/80   Pulse 84   Temp 98.3 F (36.8 C) (Oral)   Resp 14   Ht 5\' 3"  (1.6 m)   Wt 153 lb 11.2 oz (69.7 kg)   SpO2 94%   BMI 27.23 kg/m   Wt Readings from Last 3 Encounters:  10/18/17 153 lb 11.2 oz (69.7 kg)  09/20/17 152 lb 12.8 oz (69.3 kg)  06/09/15 199 lb (90.3 kg)    Physical Exam  Constitutional: She appears well-developed and well-nourished. No distress.  HENT:  Head: Normocephalic and atraumatic.  Eyes: EOM are normal. No scleral icterus.  Neck: No thyromegaly  present.  Cardiovascular: Normal rate and regular rhythm.  No murmur heard. Pulmonary/Chest: Effort normal and breath sounds normal. No respiratory distress. She has no wheezes.  Abdominal: Soft. Bowel sounds are normal. She exhibits no distension.  Musculoskeletal: She exhibits no edema.  Neurological: She is alert. She exhibits normal muscle tone.  Skin: Skin is warm and dry. She is not diaphoretic. No pallor.  Psychiatric: She has a normal mood and affect. Her behavior is normal. Judgment and thought content normal.    Results for orders placed or performed in visit on 09/20/17  TSH  Result Value Ref Range   TSH 0.72 mIU/L  CBC with Differential/Platelet  Result Value Ref Range   WBC 10.5 3.8 - 10.8 Thousand/uL   RBC 4.85 3.80 - 5.10 Million/uL   Hemoglobin 16.2 (H) 11.7 - 15.5 g/dL   HCT 45.9 (H) 35.0 - 45.0 %   MCV 94.6 80.0 - 100.0 fL   MCH 33.4 (H) 27.0 - 33.0 pg   MCHC 35.3 32.0 - 36.0 g/dL   RDW 13.0 11.0 - 15.0 %   Platelets 285 140 - 400 Thousand/uL   MPV 9.5 7.5 - 12.5 fL   Neutro Abs 6,153 1,500 - 7,800 cells/uL   Lymphs Abs 3,570 850 - 3,900 cells/uL   WBC mixed population 525 200 - 950 cells/uL   Eosinophils Absolute 147 15 - 500 cells/uL   Basophils Absolute 105 0 - 200 cells/uL   Neutrophils Relative % 58.6 %   Total Lymphocyte 34.0 %   Monocytes Relative 5.0 %   Eosinophils Relative 1.4 %   Basophils Relative 1.0 %  COMPLETE METABOLIC PANEL WITH GFR  Result Value Ref Range   Glucose, Bld 98 65 - 139 mg/dL   BUN 21 7 - 25 mg/dL   Creat 1.15 (H) 0.50 - 1.10 mg/dL   GFR, Est Non African American 56 (L) > OR = 60 mL/min/1.68m2   GFR, Est African American 65 > OR = 60 mL/min/1.37m2   BUN/Creatinine Ratio 18 6 - 22 (calc)   Sodium 139 135 - 146 mmol/L   Potassium 4.4 3.5 - 5.3 mmol/L   Chloride 103 98 - 110 mmol/L   CO2 26 20 - 32 mmol/L   Calcium 9.2 8.6 - 10.2 mg/dL   Total Protein 6.8 6.1 - 8.1 g/dL   Albumin 4.4 3.6 -  5.1 g/dL   Globulin 2.4 1.9 -  3.7 g/dL (calc)   AG Ratio 1.8 1.0 - 2.5 (calc)   Total Bilirubin 0.3 0.2 - 1.2 mg/dL   Alkaline phosphatase (APISO) 72 33 - 115 U/L   AST 19 10 - 35 U/L   ALT 14 6 - 29 U/L  Magnesium  Result Value Ref Range   Magnesium 2.2 1.5 - 2.5 mg/dL  Hemoglobin A1c  Result Value Ref Range   Hgb A1c MFr Bld 5.1 <5.7 % of total Hgb   Mean Plasma Glucose 100 (calc)   eAG (mmol/L) 5.5 (calc)  VITAMIN D 25 Hydroxy (Vit-D Deficiency, Fractures)  Result Value Ref Range   Vit D, 25-Hydroxy 25 (L) 30 - 100 ng/mL      Assessment & Plan:   Problem List Items Addressed This Visit      Cardiovascular and Mediastinum   Hypertension - Primary (Chronic)    contineue medicine; try DASH guidelines; smoking cessation would help        Nervous and Auditory   Alcoholic peripheral neuropathy (HCC)    Seems to be improving somewhat after she has significant cut down on her EtOH intake      Relevant Medications   baclofen (LIORESAL) 10 MG tablet     Genitourinary   Renal insufficiency    May have been secondary to excessive NSAID intake; patient will stop the NSAIDs, hydrate; recheck at f/u        Other   Vitamin D deficiency disease    Reviewed recent labs; replacement discussed      Tobacco use    paitent is not ready to quit quite yet; I am here to help if/when ready      Elevated hemoglobin ([place])    Discussed with patient, found on last set of labs; can be from CO, smoking, sleep apnea, etc; she'll try to cut down on smoking          Follow up plan: Return in about 3 months (around 01/18/2018) for follow-up visit with Dr. Sanda Klein; complete physical in the future.  An after-visit summary was printed and given to the patient at [place].  Please see the patient instructions which may contain other information and recommendations beyond what is mentioned above in the assessment and plan.  No orders of the defined types were placed in this encounter.   No orders of the defined types  were placed in this encounter.

## 2017-10-23 ENCOUNTER — Encounter: Payer: Self-pay | Admitting: Family Medicine

## 2017-10-23 DIAGNOSIS — D582 Other hemoglobinopathies: Secondary | ICD-10-CM | POA: Insufficient documentation

## 2017-10-23 DIAGNOSIS — N289 Disorder of kidney and ureter, unspecified: Secondary | ICD-10-CM | POA: Insufficient documentation

## 2017-10-23 NOTE — Assessment & Plan Note (Signed)
contineue medicine; try DASH guidelines; smoking cessation would help

## 2017-10-23 NOTE — Assessment & Plan Note (Signed)
Discussed with patient, found on last set of labs; can be from CO, smoking, sleep apnea, etc; she'll try to cut down on smoking

## 2017-10-23 NOTE — Assessment & Plan Note (Signed)
Seems to be improving somewhat after she has significant cut down on her EtOH intake

## 2017-10-23 NOTE — Assessment & Plan Note (Signed)
Reviewed recent labs; replacement discussed

## 2017-10-23 NOTE — Assessment & Plan Note (Signed)
paitent is not ready to quit quite yet; I am here to help if/when ready

## 2017-10-23 NOTE — Assessment & Plan Note (Signed)
May have been secondary to excessive NSAID intake; patient will stop the NSAIDs, hydrate; recheck at f/u

## 2017-12-16 ENCOUNTER — Other Ambulatory Visit: Payer: Self-pay | Admitting: Family Medicine

## 2017-12-17 MED ORDER — GABAPENTIN 300 MG PO CAPS: 300.0000 mg | ORAL_CAPSULE | Freq: Three times a day (TID) | ORAL | 2 refills | Status: DC

## 2018-01-20 ENCOUNTER — Ambulatory Visit: Payer: Self-pay | Admitting: Family Medicine

## 2018-01-23 ENCOUNTER — Other Ambulatory Visit: Payer: Self-pay | Admitting: Family Medicine

## 2018-02-25 ENCOUNTER — Ambulatory Visit: Payer: Self-pay | Admitting: Family Medicine

## 2018-03-03 ENCOUNTER — Encounter: Payer: Self-pay | Admitting: Family Medicine

## 2018-03-03 ENCOUNTER — Ambulatory Visit (INDEPENDENT_AMBULATORY_CARE_PROVIDER_SITE_OTHER): Payer: Self-pay | Admitting: Family Medicine

## 2018-03-03 VITALS — BP 138/88 | HR 81 | Temp 98.3°F | Ht 63.0 in | Wt 157.3 lb

## 2018-03-03 DIAGNOSIS — G621 Alcoholic polyneuropathy: Secondary | ICD-10-CM

## 2018-03-03 DIAGNOSIS — R718 Other abnormality of red blood cells: Secondary | ICD-10-CM

## 2018-03-03 DIAGNOSIS — R300 Dysuria: Secondary | ICD-10-CM

## 2018-03-03 DIAGNOSIS — Z23 Encounter for immunization: Secondary | ICD-10-CM

## 2018-03-03 DIAGNOSIS — N289 Disorder of kidney and ureter, unspecified: Secondary | ICD-10-CM

## 2018-03-03 LAB — COMPLETE METABOLIC PANEL WITH GFR
AG Ratio: 1.8 (calc) (ref 1.0–2.5)
ALT: 11 U/L (ref 6–29)
AST: 19 U/L (ref 10–35)
Albumin: 4.3 g/dL (ref 3.6–5.1)
Alkaline phosphatase (APISO): 76 U/L (ref 33–115)
BILIRUBIN TOTAL: 0.4 mg/dL (ref 0.2–1.2)
BUN: 11 mg/dL (ref 7–25)
CHLORIDE: 105 mmol/L (ref 98–110)
CO2: 26 mmol/L (ref 20–32)
CREATININE: 0.98 mg/dL (ref 0.50–1.10)
Calcium: 9 mg/dL (ref 8.6–10.2)
GFR, EST AFRICAN AMERICAN: 79 mL/min/{1.73_m2} (ref >=60)
GFR, Est Non African American: 68 mL/min/{1.73_m2} (ref >=60)
GLUCOSE: 97 mg/dL (ref 65–139)
Globulin: 2.4 g/dL (calc) (ref 1.9–3.7)
Potassium: 4.2 mmol/L (ref 3.5–5.3)
Sodium: 139 mmol/L (ref 135–146)
TOTAL PROTEIN: 6.7 g/dL (ref 6.1–8.1)

## 2018-03-03 LAB — CBC WITH DIFFERENTIAL/PLATELET
BASOS PCT: 0.8 %
Basophils Absolute: 78 cells/uL (ref 0–200)
EOS ABS: 98 {cells}/uL (ref 15–500)
EOS PCT: 1 %
HCT: 43.8 % (ref 35.0–45.0)
HEMOGLOBIN: 15.3 g/dL (ref 11.7–15.5)
Lymphs Abs: 3283 cells/uL (ref 850–3900)
MCH: 32.8 pg (ref 27.0–33.0)
MCHC: 34.9 g/dL (ref 32.0–36.0)
MCV: 93.8 fL (ref 80.0–100.0)
MONOS PCT: 6.7 %
MPV: 9.4 fL (ref 7.5–12.5)
NEUTROS ABS: 5684 {cells}/uL (ref 1500–7800)
Neutrophils Relative %: 58 %
PLATELETS: 238 10*3/uL (ref 140–400)
RBC: 4.67 10*6/uL (ref 3.80–5.10)
RDW: 13 % (ref 11.0–15.0)
TOTAL LYMPHOCYTE: 33.5 %
WBC mixed population: 657 cells/uL (ref 200–950)
WBC: 9.8 10*3/uL (ref 3.8–10.8)

## 2018-03-03 LAB — POCT URINALYSIS DIPSTICK
Bilirubin, UA: NEGATIVE
GLUCOSE UA: NEGATIVE
KETONES UA: NEGATIVE
NITRITE UA: NEGATIVE
ODOR: NORMAL
PH UA: 5.5 (ref 5.0–8.0)
Protein, UA: NEGATIVE
RBC UA: NEGATIVE
SPEC GRAV UA: 1.015 (ref 1.010–1.025)
Urobilinogen, UA: 0.2 E.U./dL

## 2018-03-03 MED ORDER — NITROFURANTOIN MONOHYD MACRO 100 MG PO CAPS: 100.0000 mg | ORAL_CAPSULE | Freq: Two times a day (BID) | ORAL | 0 refills | Status: DC

## 2018-03-03 MED ORDER — BACLOFEN 10 MG PO TABS: 10.0000 mg | ORAL_TABLET | Freq: Two times a day (BID) | ORAL | 0 refills | Status: AC

## 2018-03-03 MED ORDER — GABAPENTIN 300 MG PO CAPS: ORAL_CAPSULE | ORAL | 2 refills | Status: DC

## 2018-03-03 NOTE — Patient Instructions (Addendum)
Try to follow the DASH guidelines (DASH stands for Dietary Approaches to Stop Hypertension). Try to limit the sodium in your diet to no more than 1,500mg  of sodium per day. Certainly try to not exceed 2,000 mg per day at the very most. Do not add salt when cooking or at the table.  Check the sodium amount on labels when shopping, and choose items lower in sodium when given a choice. Avoid or limit foods that already contain a lot of sodium. Eat a diet rich in fruits and vegetables and whole grains, and try to lose weight if overweight or obese  I do encourage you to quit smoking Call [phone] to sign up for smoking cessation classes You can call 1-800-QUIT-NOW to talk with a smoking cessation coach  We'll get labs today If you have not heard anything from my staff in a week about any orders/referrals/studies from today, please contact us here to follow-up [phone]

## 2018-03-03 NOTE — Progress Notes (Signed)
BP 138/88   Pulse 81   Temp 98.3 F (36.8 C) (Oral)   Ht 5\' 3"  (1.6 m)   Wt 157 lb 4.8 oz (71.4 kg)   SpO2 95%   BMI 27.86 kg/m    Subjective:    Patient ID: Beth [name], female    DOB: 06-18-1970, 47 y.o.   MRN: [id number]  HPI: Beth [name] is a 47 y.o. female  Chief Complaint  Patient presents with  . Follow-up  . Urinary Tract Infection    pressure, burning with urination, currently taking azo    HPI Patient is here for f/u, but also thinks she has a bladder infection  She is having pressure over her bladder; she has dysuria; she has been taking AZO Urine dip done in house, but urine was orange from the OTC med; culture is pending; no fevers  Review of last set of labs; vitamin D deficiency, level was 25 in May  Normal A1c and normal TSH  Creatinine was mildly elevated at 1.15 with a GFR of 56 in May; LFTs were normal; drinks flavored water, not getting 8 glasses a day of water; not snoring or stopping breathing in her sleep  CBC showed elevated H/H (16.2/45.9); RBC indices showed normal MCV and slightly elevated MCH at 33.4  She fell when she slipped on frost; fell on her "butt"; no LOC and did not hit head; knotted up and having muscle spasms; requesting muscle relaxer; no loss of control of B/B  Breathing is actually doing pretty good; she has cut down on smoking a lot; down to less than 1/2 ppd Few beers with friends Saturday night; not more than 3 at a time; quit the liquor  Having more pain from neuropathy; 300 mg TID and would like to increse that; due to alcoholic neuropathy  Heartburn; using omeprazole 10 mg; no blood in stool  HTN; on medicine; taking meds in the morning; in pain and eats a lot of salt  Depression screen [facility] 2/9 03/03/2018 09/20/2017  Decreased Interest 0 0  Down, Depressed, Hopeless 0 1  PHQ - 2 Score 0 1  Altered sleeping 0 -  Tired, decreased energy 0 -  Change in appetite 0 -  Feeling bad or failure about yourself  0 -    Trouble concentrating 0 -  Moving slowly or fidgety/restless 0 -  Suicidal thoughts 0 -  PHQ-9 Score 0 -  Difficult doing work/chores Not difficult at all -   Fall Risk  03/03/2018 09/20/2017  Falls in the past year? 1 No  Number falls in past yr: 0 -  Injury with Fall? 1 -  Comment back pain -    Relevant past medical, surgical, family and social history reviewed Past Medical History:  Diagnosis Date  . Abnormal gamma-glutamyl transferase test   . Adjustment disorder with mixed anxiety and depressed mood   . Adrenal nodule ([place]) 10/01/2017   Due for repeat CT scan June 2020  . Alcohol abuse   . Alcoholic peripheral neuropathy ([place])   . Anemia   . Arthritis   . Asthma   . Cardiac murmur   . Chronic alcoholic hepatitis   . Chronic alcoholism ([place])   . Chronic alcoholism in remission ([place])   . Chronic back pain   . COPD (chronic obstructive pulmonary disease) ([place])   . Elevated liver enzymes Jan 2014   referred to and seen by GI  . Fatty liver   . GERD (gastroesophageal reflux  disease)   . Hiatal hernia   . Hypertension   . Macrocytosis    alcoholic  . Ruptured tubal pregnancy   . 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 Thoracic spinal stenosis 10/01/2017  . Tobacco abuse   . Vitamin D deficiency disease    Past Surgical History:  Procedure Laterality Date  . FINGER SURGERY    . LAPAROSCOPIC OOPHERECTOMY Right   . TUBAL LIGATION  1992   Family History  Problem Relation Age of Onset  . Cancer Mother        lung  . Kidney failure Father   . Diabetes Father   . Hypertension Father   . Heart disease Father   . Heart attack Father   . Cancer Brother        lung, liver  . Diabetes Brother   . Hypertension Brother   . Cancer Brother        lung, brain  . Hypertension Sister   . Diabetes Maternal Grandmother   . Hypertension Maternal Grandmother   . Hypertension Maternal Grandfather   . Hypertension Paternal Grandmother   . Hypertension Paternal Grandfather     Social History   Tobacco Use  . Smoking status: Current Every Day Smoker    Packs/day: 1.00    Years: 25.00    Pack years: 25.00    Types: Cigarettes  . Smokeless tobacco: Never Used  Substance Use Topics  . Alcohol use: Yes    Comment: very seldom  . Drug use: No     [facility]  AUDIT-C Score  2      Interim medical history since last visit reviewed. Allergies and medications reviewed  Review of Systems Per HPI unless specifically indicated above     Objective:    BP 138/88   Pulse 81   Temp 98.3 F (36.8 C) (Oral)   Ht 5\' 3"  (1.6 m)   Wt 157 lb 4.8 oz (71.4 kg)   SpO2 95%   BMI 27.86 kg/m   Wt Readings from Last 3 Encounters:  03/03/18 157 lb 4.8 oz (71.4 kg)  10/18/17 153 lb 11.2 oz (69.7 kg)  09/20/17 152 lb 12.8 oz (69.3 kg)    Physical Exam  Constitutional: She appears well-developed and well-nourished. No distress.  HENT:  Head: Normocephalic and atraumatic.  Eyes: EOM are normal. No scleral icterus.  Neck: No thyromegaly present.  Cardiovascular: Normal rate, regular rhythm and normal heart sounds.  No murmur heard. Pulmonary/Chest: Effort normal and breath sounds normal. No respiratory distress. She has no wheezes.  Abdominal: Soft. Bowel sounds are normal. She exhibits no distension.  Musculoskeletal: She exhibits no edema.  Neurological: She is alert.  Skin: Skin is warm and dry. She is not diaphoretic. No pallor.  Psychiatric: She has a normal mood and affect. Her behavior is normal. Judgment and thought content normal.      Assessment & Plan:   Problem List Items Addressed This Visit      Nervous and Auditory   Alcoholic peripheral neuropathy (HCC)   Relevant Medications   gabapentin (NEURONTIN) 300 MG capsule   baclofen (LIORESAL) 10 MG tablet   Other Relevant Orders   CBC with Differential/Platelet (Completed)   COMPLETE METABOLIC PANEL WITH GFR (Completed)     Genitourinary    Renal insufficiency   Relevant Orders   COMPLETE METABOLIC PANEL WITH GFR (Completed)    Other Visit Diagnoses    Burning with urination    -  Primary   likely acute cystitis; culture pending; start antibiotics   Relevant Orders   POCT urinalysis dipstick (Completed)   Urine Culture (Completed)   Elevated hematocrit       reviewed previous labs; discussed ddx; recheck labs   Relevant Orders   CBC with Differential/Platelet (Completed)   Need for influenza vaccination       Relevant Orders   Flu Vaccine QUAD 6+ mos PF IM (Fluarix Quad PF) (Completed)       Follow up plan: Return in about 3 months (around 06/03/2018) for CPE.  An after-visit summary was printed and given to the patient at [place].  Please see the patient instructions which may contain other information and recommendations beyond what is mentioned above in the assessment and plan.  Meds ordered this encounter  Medications  . gabapentin (NEURONTIN) 300 MG capsule    Sig: One by mouth every morning, one every afternoon, and two at bedtime    Dispense:  120 capsule    Refill:  2  . nitrofurantoin, macrocrystal-monohydrate, (MACROBID) 100 MG capsule    Sig: Take 1 capsule (100 mg total) by mouth 2 (two) times daily.    Dispense:  6 capsule    Refill:  0  . baclofen (LIORESAL) 10 MG tablet    Sig: Take 1 tablet (10 mg total) by mouth 2 (two) times daily.    Dispense:  30 each    Refill:  0    Orders Placed This Encounter  Procedures  . Urine Culture  . Flu Vaccine QUAD 6+ mos PF IM (Fluarix Quad PF)  . CBC with Differential/Platelet  . COMPLETE METABOLIC PANEL WITH GFR  . POCT urinalysis dipstick

## 2018-03-06 LAB — URINE CULTURE
MICRO NUMBER: 91326886
SPECIMEN QUALITY: ADEQUATE

## 2018-03-23 ENCOUNTER — Encounter: Payer: Self-pay | Admitting: Family Medicine

## 2018-03-26 ENCOUNTER — Encounter: Payer: Self-pay | Admitting: Family Medicine

## 2018-08-29 ENCOUNTER — Telehealth: Payer: Self-pay | Admitting: Family Medicine

## 2018-08-29 ENCOUNTER — Encounter: Payer: Self-pay | Admitting: Family Medicine

## 2018-08-29 DIAGNOSIS — E279 Disorder of adrenal gland, unspecified: Principal | ICD-10-CM

## 2018-08-29 DIAGNOSIS — E278 Other specified disorders of adrenal gland: Secondary | ICD-10-CM

## 2018-08-29 NOTE — Telephone Encounter (Signed)
Per Dr. Delight Ovens notations and review of CT Chest from May 2019, patient is due for 1 year follow up on Adrenal Nodule - Ct is ordered, message sent via MyChart to patient to inform her.

## 2018-10-02 ENCOUNTER — Other Ambulatory Visit: Payer: Self-pay

## 2018-10-02 ENCOUNTER — Ambulatory Visit
Admission: RE | Admit: 2018-10-02 | Discharge: 2018-10-02 | Disposition: A | Discharge status: Home / Self Care | Payer: Self-pay | Source: Ambulatory Visit | Attending: Family Medicine | Admitting: Family Medicine

## 2018-10-02 DIAGNOSIS — E278 Other specified disorders of adrenal gland: Secondary | ICD-10-CM

## 2018-10-02 DIAGNOSIS — E279 Disorder of adrenal gland, unspecified: Secondary | ICD-10-CM | POA: Insufficient documentation

## 2018-10-06 ENCOUNTER — Other Ambulatory Visit: Payer: Self-pay | Admitting: Family Medicine

## 2018-10-06 DIAGNOSIS — E278 Other specified disorders of adrenal gland: Secondary | ICD-10-CM

## 2018-10-06 DIAGNOSIS — D3502 Benign neoplasm of left adrenal gland: Secondary | ICD-10-CM

## 2018-10-08 ENCOUNTER — Encounter: Payer: Self-pay | Admitting: General Surgery

## 2018-10-08 ENCOUNTER — Ambulatory Visit: Payer: Self-pay | Admitting: General Surgery

## 2018-10-08 ENCOUNTER — Other Ambulatory Visit: Payer: Self-pay

## 2018-10-08 VITALS — BP 174/97 | HR 88 | Temp 98.2°F | Ht 64.0 in | Wt 145.0 lb

## 2018-10-08 DIAGNOSIS — E278 Other specified disorders of adrenal gland: Secondary | ICD-10-CM

## 2018-10-08 MED ORDER — DEXAMETHASONE 1 MG PO TABS: 1.0000 mg | ORAL_TABLET | Freq: Once | ORAL | 0 refills | Status: AC

## 2018-10-08 NOTE — Progress Notes (Signed)
Patient ID: Beth [name], female   DOB: [birth year], 48 y.o.   MRN: [id number]  Chief Complaint  Patient presents with  . Other    adrenal gland nodules    HPI Beth SALDIERNA is a 48 y.o. female.   She has been referred by Dr. Enid Derry for evaluation of bilateral adrenal masses.  She underwent a chest CT for reasons that she does not recall, however adrenal masses were identified.  She subsequently had an adrenal protocol CT scan.  This identified bilateral adrenal masses.  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  She is here for further evaluation and management of these masses.  She does not have a history of malignancy.  She states that she has hypertension, but only takes a single agent (amlodipine) for control.  She has never had high blood glucose.  She denies any significant weight fluctuations.  She says that she did get dizzy at [facility] last week, but has had no heart palpitations or syncope.  Her dizziness resolved when she was able to get home and rest.  She has a history of low potassium in the past, around 2016.  She was briefly taking a potassium supplement, but is no longer taking it.  Her most recent potassium level was within normal limits.  She has peripheral neuropathy for which she takes gabapentin.  She has been prescribed Elavil for sleep, but states that she has not taken it for several weeks.   Past Medical History:  Diagnosis Date  . Abnormal gamma-glutamyl transferase test   . Adjustment disorder with mixed anxiety and depressed mood   . Adrenal nodule ([place]) 10/01/2017   Due for repeat CT scan June 2020  . Alcohol abuse   . Alcoholic peripheral neuropathy ([place])   . Anemia   . Arthritis   . Asthma   . Cardiac murmur   . Chronic alcoholic hepatitis   . Chronic alcoholism ([place])   . Chronic alcoholism in remission ([place])   . Chronic back pain   . COPD (chronic obstructive pulmonary  disease) ([place])   . Elevated liver enzymes Jan 2014   referred to and seen by GI  . Fatty liver   . GERD (gastroesophageal reflux disease)   . Hiatal hernia   . Hypertension   . Macrocytosis    alcoholic  . Ruptured tubal pregnancy   . 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 Thoracic spinal stenosis 10/01/2017  . Tobacco abuse   . Vitamin D deficiency disease     Past Surgical History:  Procedure Laterality Date  . FINGER SURGERY    . LAPAROSCOPIC OOPHERECTOMY Right   . TUBAL LIGATION  1992    Family History  Problem Relation Age of Onset  . Cancer Mother        lung  . Kidney failure Father   . Diabetes Father   . Hypertension Father   . Heart disease Father   . Heart attack Father   . Cancer Brother        lung, liver  . Diabetes Brother   . Hypertension Brother   . Cancer Brother        lung, brain  . Hypertension Sister   . Diabetes Maternal Grandmother   . Hypertension Maternal Grandmother   . Hypertension Maternal Grandfather   . Hypertension Paternal Grandmother   . Hypertension Paternal Grandfather     Social History Social  History   Tobacco Use  . Smoking status: Current Every Day Smoker    Packs/day: 1.00    Years: 25.00    Pack years: 25.00    Types: Cigarettes  . Smokeless tobacco: Never Used  Substance Use Topics  . Alcohol use: Yes    Comment: very seldom  . Drug use: No    Allergies  Allergen Reactions  . Latex   . Penicillins Hives  . Erythromycin Itching and Rash    Current Outpatient Medications  Medication Sig Dispense Refill  . albuterol (PROVENTIL HFA;VENTOLIN HFA) 108 (90 Base) MCG/ACT inhaler Inhale 2 puffs into the lungs every 4 (four) hours as needed for wheezing or shortness of breath. 1 Inhaler 3  . amitriptyline (ELAVIL) 25 MG tablet TAKE 1 TABLET BY MOUTH AT BEDTIME AS NEEDED FOR SLEEP 90 tablet 1  . amLODipine (NORVASC) 5 MG tablet Take 1 tablet (5 mg total) by mouth daily. 30 tablet 11  . budesonide-formoterol  (SYMBICORT) 160-4.5 MCG/ACT inhaler Inhale 2 puffs into the lungs 2 (two) times daily. Reported on 04/28/2015 1 Inhaler 11  . gabapentin (NEURONTIN) 300 MG capsule One by mouth every morning, one every afternoon, and two at bedtime 120 capsule 2  . nitrofurantoin, macrocrystal-monohydrate, (MACROBID) 100 MG capsule Take 1 capsule (100 mg total) by mouth 2 (two) times daily. 6 capsule 0  . omeprazole (PRILOSEC) 10 MG capsule Take 1 capsule (10 mg total) by mouth daily. 30 capsule 5  . dexamethasone (DECADRON) 1 MG tablet Take 1 tablet (1 mg total) by mouth once for 1 dose. Take at 11 pm the night before lab draw; have labs drawn by 8 am the following day. 1 tablet 0   No current facility-administered medications for this visit.     Review of Systems Review of Systems  All other systems reviewed and are negative.   Blood pressure (!) 174/97, pulse 88, temperature 98.2 F (36.8 C), temperature source Skin, height 5\' 4"  (1.626 m), weight 145 lb (65.8 kg), SpO2 98 %.  Physical Exam Physical Exam Vitals signs reviewed.  Constitutional:      Appearance: Normal appearance. She is normal weight.  HENT:     Head: Normocephalic and atraumatic.     Comments: No facial plethora or "chipmunk cheeks".    Nose:     Comments: Covered with a mask secondary to COVID-19 precautions    Mouth/Throat:     Comments: Covered with a mask secondary to COVID-19 precautions Eyes:     General: No scleral icterus.    Conjunctiva/sclera: Conjunctivae normal.  Neck:     Musculoskeletal: No neck rigidity.     Comments: No enlargement of the dorsocervical fat pad Cardiovascular:     Rate and Rhythm: Normal rate and regular rhythm.     Pulses: Normal pulses.  Pulmonary:     Effort: Pulmonary effort is normal.     Breath sounds: Normal breath sounds.  Abdominal:     General: There is no distension.     Palpations: Abdomen is soft. There is no mass.     Tenderness: There is no abdominal tenderness.      Comments: No violaceous striae.  No centripetal obesity.  Genitourinary:    Comments: Deferred Musculoskeletal:     Comments: No proximal muscle weakness  Lymphadenopathy:     Cervical: No cervical adenopathy.  Skin:    General: Skin is warm and dry.     Comments: No easy bruising or bleeding  Neurological:  General: No focal deficit present.     Mental Status: She is alert.  Psychiatric:        Behavior: Behavior normal.        Thought Content: Thought content normal.        Judgment: Judgment normal.     Data Reviewed No labs have yet been obtained.  I reviewed the CT imaging which does show bilateral adrenal masses, as discussed in the history of present illness   Assessment  This is a 48 year old woman with no past history of malignancy, presenting with bilateral adrenal masses.  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  These are almost uniformly benign and do not secrete hormones.  The right-sided gland is consistent with an adenoma.  She does not have any findings suggestive of hormonal hypersecretion, but we will rule this out with laboratory studies.  Plan We will obtain a low-dose dexamethasone suppression test, check an aldosterone renin activity ratio, and evaluate for pheochromocytoma with plasma metanephrines and fractionated catecholamines.  I do not anticipate that any of these will be positive.  We did discuss what we would do if any of them did come back positive, namely potential surgical intervention.  I will contact her when I have the results.  Assuming all are negative, we will schedule her for a repeat CT scan in 1 years time, to evaluate for any interval growth.    Fredirick Maudlin 10/08/2018, 10:13 AM

## 2018-10-08 NOTE — Patient Instructions (Addendum)
Patient to have labs done. Take the medication,the night before around 11: 00pm and go to the lab at 8.00pm. Will send you a message on my chart. after results.

## 2018-10-10 ENCOUNTER — Other Ambulatory Visit: Payer: Self-pay

## 2018-10-10 ENCOUNTER — Encounter: Payer: Self-pay | Admitting: Family Medicine

## 2018-10-11 MED ORDER — AMLODIPINE BESYLATE 5 MG PO TABS: 5.0000 mg | ORAL_TABLET | Freq: Every day | ORAL | 0 refills | Status: DC

## 2018-10-11 MED ORDER — GABAPENTIN 300 MG PO CAPS: ORAL_CAPSULE | ORAL | 0 refills | Status: DC

## 2018-10-11 NOTE — Telephone Encounter (Signed)
Please schedule patient for follow up in the next 30 days.  

## 2018-10-13 ENCOUNTER — Telehealth: Payer: Self-pay | Admitting: *Deleted

## 2018-10-13 NOTE — Telephone Encounter (Signed)
Patient called and wanted to know if she can have her lab work drawn somewhere other than [facility], she stated that it was going to cost her a lot of money. Please call and advise

## 2018-10-14 NOTE — Telephone Encounter (Signed)
Talk to patient about all labs are almost the same prices. Told her about [facility] too. She has no insurances.

## 2018-10-20 NOTE — Telephone Encounter (Signed)
LVM to sche appt

## 2018-11-10 ENCOUNTER — Encounter: Payer: Self-pay | Admitting: Family Medicine

## 2018-11-19 ENCOUNTER — Telehealth: Payer: Self-pay | Admitting: *Deleted

## 2018-11-19 NOTE — Telephone Encounter (Signed)
Patient states she will go to [facility] to get her labs none by next week. We will call patient back with results and a plan .

## 2018-11-19 NOTE — Telephone Encounter (Signed)
Left message for patient to call us back.  

## 2018-12-10 ENCOUNTER — Other Ambulatory Visit: Payer: Self-pay

## 2018-12-10 ENCOUNTER — Encounter: Payer: Self-pay | Admitting: *Deleted

## 2018-12-10 ENCOUNTER — Emergency Department
Admission: EM | Admit: 2018-12-10 | Discharge: 2018-12-10 | Disposition: A | Discharge status: Home / Self Care | Payer: Medicaid Other | Attending: Emergency Medicine | Admitting: Emergency Medicine

## 2018-12-10 DIAGNOSIS — S81812A Laceration without foreign body, left lower leg, initial encounter: Secondary | ICD-10-CM | POA: Insufficient documentation

## 2018-12-10 DIAGNOSIS — Y9389 Activity, other specified: Secondary | ICD-10-CM | POA: Insufficient documentation

## 2018-12-10 DIAGNOSIS — Y92812 Truck as the place of occurrence of the external cause: Secondary | ICD-10-CM | POA: Insufficient documentation

## 2018-12-10 DIAGNOSIS — Z79899 Other long term (current) drug therapy: Secondary | ICD-10-CM | POA: Insufficient documentation

## 2018-12-10 DIAGNOSIS — I10 Essential (primary) hypertension: Secondary | ICD-10-CM | POA: Insufficient documentation

## 2018-12-10 DIAGNOSIS — F17210 Nicotine dependence, cigarettes, uncomplicated: Secondary | ICD-10-CM | POA: Insufficient documentation

## 2018-12-10 DIAGNOSIS — J449 Chronic obstructive pulmonary disease, unspecified: Secondary | ICD-10-CM | POA: Insufficient documentation

## 2018-12-10 DIAGNOSIS — J45909 Unspecified asthma, uncomplicated: Secondary | ICD-10-CM | POA: Insufficient documentation

## 2018-12-10 DIAGNOSIS — W231XXA Caught, crushed, jammed, or pinched between stationary objects, initial encounter: Secondary | ICD-10-CM | POA: Insufficient documentation

## 2018-12-10 DIAGNOSIS — Y999 Unspecified external cause status: Secondary | ICD-10-CM | POA: Insufficient documentation

## 2018-12-10 MED ORDER — TETANUS-DIPHTH-ACELL PERTUSSIS 5-2.5-18.5 LF-MCG/0.5 IM SUSP: 0.5000 mL | Freq: Once | INTRAMUSCULAR | Status: DC

## 2018-12-10 MED ORDER — LIDOCAINE HCL 1 % IJ SOLN
20.0000 mL | Freq: Once | INTRAMUSCULAR | Status: DC
  Filled 2018-12-10: qty 20

## 2018-12-10 MED ORDER — CEPHALEXIN 500 MG PO CAPS: 500.0000 mg | ORAL_CAPSULE | Freq: Three times a day (TID) | ORAL | 0 refills | Status: AC

## 2018-12-10 NOTE — ED Provider Notes (Signed)
[facility] Emergency Department Provider Note  ____________________________________________  Time seen: Approximately 8:24 PM  I have reviewed the triage vital signs and the nursing notes.   HISTORY  Chief Complaint Laceration    HPI Beth [name] is a 48 y.o. female presents to the emergency department with a 7 cm skin tear of the left lower leg.  Patient reports that she caught her foot underneath the seat of her boyfriend's truck.  No numbness or tingling of the left lower extremity.  Tetanus status is out of date.  No other alleviating measures have been attempted.        Past Medical History:  Diagnosis Date  . Abnormal gamma-glutamyl transferase test   . Adjustment disorder with mixed anxiety and depressed mood   . Adrenal nodule ([place]) 10/01/2017   Due for repeat CT scan June 2020  . Alcohol abuse   . Alcoholic peripheral neuropathy ([place])   . Anemia   . Arthritis   . Asthma   . Cardiac murmur   . Chronic alcoholic hepatitis   . Chronic alcoholism ([place])   . Chronic alcoholism in remission ([place])   . Chronic back pain   . COPD (chronic obstructive pulmonary disease) ([place])   . Elevated liver enzymes Jan 2014   referred to and seen by GI  . Fatty liver   . GERD (gastroesophageal reflux disease)   . Hiatal hernia   . Hypertension   . Macrocytosis    alcoholic  . Ruptured tubal pregnancy   . 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 Thoracic spinal stenosis 10/01/2017  . Tobacco abuse   . Vitamin D deficiency disease     Patient Active Problem List   Diagnosis Date Noted  . Elevated hemoglobin ([place]) 10/23/2017  . Renal insufficiency 10/23/2017  . 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 Thoracic spinal stenosis 10/01/2017  . Adrenal nodule ([place]) 10/01/2017  . Tobacco use 09/25/2017  . Hypomagnesemia 04/28/2015  . Elevated alkaline phosphatase level 01/25/2015  . Stress due to family tension 01/25/2015  . Folic acid deficiency 82/99/3716   . Chronic alcoholism in remission ([place])   . Macrocytosis   . Chronic alcoholic hepatitis   . Abnormal gamma-glutamyl transferase test   . GERD (gastroesophageal reflux disease)   . Asthma   . COPD (chronic obstructive pulmonary disease) ([place])   . Hypertension   . Vitamin D deficiency disease   . Adjustment disorder with mixed anxiety and depressed mood   . Alcohol abuse, in remission 04/08/2013  . Alcoholic peripheral neuropathy ([place]) 04/08/2013  . 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 Tobacco abuse 04/08/2013  . Cervical neuralgia 04/08/2013  . Occipital neuralgia 04/08/2013  . Gait difficulty 04/08/2013  . Elevated liver enzymes 04/30/2012    Past Surgical History:  Procedure Laterality Date  . FINGER SURGERY    . LAPAROSCOPIC OOPHERECTOMY Right   . TUBAL LIGATION  1992    Prior to Admission medications   Medication Sig Start Date End Date Taking? Authorizing Provider  albuterol (PROVENTIL HFA;VENTOLIN HFA) 108 (90 Base) MCG/ACT inhaler Inhale 2 puffs into the lungs every 4 (four) hours as needed for wheezing or shortness of breath. 09/20/17   Lada, Satira Anis, MD  amitriptyline (ELAVIL) 25 MG tablet TAKE 1 TABLET BY MOUTH AT BEDTIME AS NEEDED FOR SLEEP 01/24/18   Lada, Satira Anis, MD  amLODipine (NORVASC) 5 MG tablet Take 1 tablet (5 mg total) by mouth daily. 10/11/18   Hubbard Hartshorn, FNP  budesonide-formoterol (SYMBICORT) 160-4.5 MCG/ACT inhaler Inhale 2 puffs into the lungs 2 (two) times daily. Reported on 04/28/2015 09/20/17   Arnetha Courser, MD  cephALEXin (KEFLEX) 500 MG capsule Take 1 capsule (500 mg total) by mouth 3 (three) times daily for 7 days. 12/10/18 12/17/18  Lannie Fields, PA-C  gabapentin (NEURONTIN) 300 MG capsule One by mouth every morning, one every afternoon, and two at bedtime 10/11/18   Hubbard Hartshorn, FNP  nitrofurantoin, macrocrystal-monohydrate, (MACROBID) 100 MG capsule Take 1 capsule (100 mg total) by mouth 2 (two) times  daily. 03/03/18   Arnetha Courser, MD  omeprazole (PRILOSEC) 10 MG capsule Take 1 capsule (10 mg total) by mouth daily. 09/20/17   Arnetha Courser, MD    Allergies Latex, Penicillins, and Erythromycin  Family History  Problem Relation Age of Onset  . Cancer Mother        lung  . Kidney failure Father   . Diabetes Father   . Hypertension Father   . Heart disease Father   . Heart attack Father   . Cancer Brother        lung, liver  . Diabetes Brother   . Hypertension Brother   . Cancer Brother        lung, brain  . Hypertension Sister   . Diabetes Maternal Grandmother   . Hypertension Maternal Grandmother   . Hypertension Maternal Grandfather   . Hypertension Paternal Grandmother   . Hypertension Paternal Grandfather     Social History Social History   Tobacco Use  . Smoking status: Current Every Day Smoker    Packs/day: 1.00    Years: 25.00    Pack years: 25.00    Types: Cigarettes  . Smokeless tobacco: Never Used  Substance Use Topics  . Alcohol use: Yes    Comment: very seldom  . Drug use: No     Review of Systems  Constitutional: No fever/chills Eyes: No visual changes. No discharge ENT: No upper respiratory complaints. Cardiovascular: no chest pain. Respiratory: no cough. No SOB. Gastrointestinal: No abdominal pain.  No nausea, no vomiting.  No diarrhea.  No constipation. Musculoskeletal: Negative for musculoskeletal pain. Skin: Patient has left lower leg laceration.  Neurological: Negative for headaches, focal weakness or numbness. .  ____________________________________________   PHYSICAL EXAM:  VITAL SIGNS: ED Triage Vitals [12/10/18 1820]  Enc Vitals Group     BP (!) 211/93     Pulse Rate 82     Resp      Temp 98.4 F (36.9 C)     Temp Source Oral     SpO2 100 %     Weight 143 lb (64.9 kg)     Height 5\' 4"  (1.626 m)     Head Circumference      Peak Flow      Pain Score      Pain Loc      Pain Edu?      Excl. in [place]?       Constitutional: Alert and oriented. Well appearing and in no acute distress. Eyes: Conjunctivae are normal. PERRL. EOMI. Head: Atraumatic. Cardiovascular: Normal rate, regular rhythm. Normal S1 and S2.  Good peripheral circulation. Respiratory: Normal respiratory effort without tachypnea or retractions. Lungs CTAB. Good air entry to the bases with no decreased or absent breath sounds. Gastrointestinal: Bowel sounds 4 quadrants. Soft and nontender to palpation. No guarding or rigidity. No palpable masses. No distention. No CVA tenderness. Musculoskeletal: Full range of motion to all  extremities. No gross deformities appreciated. Neurologic:  Normal speech and language. No gross focal neurologic deficits are appreciated.  Skin: Patient has 7 cm gaping laceration of left lower leg.  Laceration is deep to underlying adipose tissue. Psychiatric: Mood and affect are normal. Speech and behavior are normal. Patient exhibits appropriate insight and judgement.   ____________________________________________   LABS (all labs ordered are listed, but only abnormal results are displayed)  Labs Reviewed - No data to display ____________________________________________  EKG   ____________________________________________  RADIOLOGY  No results found.  ____________________________________________    PROCEDURES  Procedure(s) performed:    Procedures  LACERATION REPAIR Performed by: Lannie Fields Authorized by: Lannie Fields Consent: Verbal consent obtained. Risks and benefits: risks, benefits and alternatives were discussed Consent given by: patient Patient identity confirmed: provided demographic data Prepped and Draped in normal sterile fashion Wound explored  Laceration Location: Left lower leg  Laceration Length: 7 cm  No Foreign Bodies seen or palpated  Anesthesia: local infiltration  Local anesthetic: lidocaine 1% without epinephrine  Anesthetic total: 10  ml  Irrigation method: syringe Amount of cleaning: standard  Skin closure: 4-0 Ethilon   Number of sutures: 9  Technique: Horizontal Mattress  Patient tolerance: Patient tolerated the procedure well with no immediate complications.   Medications  lidocaine (XYLOCAINE) 1 % (with pres) injection 20 mL (has no administration in time range)     ____________________________________________   INITIAL IMPRESSION / ASSESSMENT AND PLAN / ED COURSE  Pertinent labs & imaging results that were available during my care of the patient were reviewed by me and considered in my medical decision making (see chart for details).  Review of the [place] CSRS was performed in accordance of the [place] prior to dispensing any controlled drugs.           Assessment and plan Leg laceration 48 year old female presents to the emergency department with a 7 cm gaping lower leg laceration. Patient was hypertensive at triage.  Patient states that she has not taken her amlodipine today and is very scared about being in the emergency department.  Laceration repair occurred in the emergency department without complication.  Patient was advised to have external sutures removed in 7 days.  She was discharged with Keflex and her tetanus status was updated in the emergency department.  All patient questions were answered.   ____________________________________________  FINAL CLINICAL IMPRESSION(S) / ED DIAGNOSES  Final diagnoses:  Laceration of left lower extremity, initial encounter      NEW MEDICATIONS STARTED DURING THIS VISIT:  ED Discharge Orders         Ordered    cephALEXin (KEFLEX) 500 MG capsule  3 times daily     12/10/18 2019              This chart was dictated using voice recognition software/Dragon. Despite best efforts to proofread, errors can occur which can change the meaning. Any change was purely unintentional.    Lannie Fields, PA-C 12/10/18 2059    Arta Silence, MD 12/10/18 2154

## 2018-12-10 NOTE — ED Triage Notes (Signed)
Pt has laceration to left lower leg.  Cut leg on plastic.  Bleeding controlled.  Pt alert.

## 2018-12-11 ENCOUNTER — Encounter: Payer: Self-pay | Admitting: Family Medicine

## 2018-12-11 MED ORDER — AMLODIPINE BESYLATE 5 MG PO TABS: 5.0000 mg | ORAL_TABLET | Freq: Every day | ORAL | 0 refills | Status: DC

## 2018-12-17 ENCOUNTER — Encounter: Payer: Self-pay | Admitting: Nurse Practitioner

## 2018-12-17 ENCOUNTER — Other Ambulatory Visit: Payer: Self-pay

## 2018-12-17 ENCOUNTER — Ambulatory Visit: Payer: Self-pay | Admitting: Nurse Practitioner

## 2018-12-17 VITALS — BP 136/72 | HR 89 | Temp 96.8°F | Resp 14 | Ht 64.0 in | Wt 146.1 lb

## 2018-12-17 DIAGNOSIS — I10 Essential (primary) hypertension: Secondary | ICD-10-CM

## 2018-12-17 DIAGNOSIS — Z598 Other problems related to housing and economic circumstances: Secondary | ICD-10-CM

## 2018-12-17 DIAGNOSIS — J449 Chronic obstructive pulmonary disease, unspecified: Secondary | ICD-10-CM

## 2018-12-17 DIAGNOSIS — G621 Alcoholic polyneuropathy: Secondary | ICD-10-CM

## 2018-12-17 DIAGNOSIS — G4700 Insomnia, unspecified: Secondary | ICD-10-CM | POA: Insufficient documentation

## 2018-12-17 DIAGNOSIS — D582 Other hemoglobinopathies: Secondary | ICD-10-CM

## 2018-12-17 DIAGNOSIS — E279 Disorder of adrenal gland, unspecified: Secondary | ICD-10-CM

## 2018-12-17 DIAGNOSIS — Z4802 Encounter for removal of sutures: Secondary | ICD-10-CM

## 2018-12-17 DIAGNOSIS — Z599 Problem related to housing and economic circumstances, unspecified: Secondary | ICD-10-CM

## 2018-12-17 DIAGNOSIS — E278 Other specified disorders of adrenal gland: Secondary | ICD-10-CM

## 2018-12-17 DIAGNOSIS — Z23 Encounter for immunization: Secondary | ICD-10-CM

## 2018-12-17 DIAGNOSIS — F1021 Alcohol dependence, in remission: Secondary | ICD-10-CM

## 2018-12-17 DIAGNOSIS — S81812D Laceration without foreign body, left lower leg, subsequent encounter: Secondary | ICD-10-CM

## 2018-12-17 MED ORDER — AMLODIPINE BESYLATE 5 MG PO TABS: 5.0000 mg | ORAL_TABLET | Freq: Every day | ORAL | 3 refills | Status: DC

## 2018-12-17 NOTE — Progress Notes (Addendum)
Name: Beth [name]   MRN: [id number]    DOB: 15-Mar-1971   Date:12/17/2018       Progress Note  Subjective  Chief Complaint  Chief Complaint  Patient presents with  . Follow-up  . Suture / Staple Removal    HPI  Last week was coming out of truck and got leg caught on plastic piece leaving large laceration to left calf, got it sutured at [facility] last week, no drainage, swelling. Some redness at site around where tape was and bruising.   Hypertension Patient is on amlodipine 55m daily.  Takes medications as prescribed with no missed doses a month.  He is compliant with low-salt diet.  Denies chest pain, headaches, dizziness.  BP Readings from Last 3 Encounters:  12/17/18 136/72  12/10/18 (!) 198/90  10/08/18 (!) 174/97    COPD Smoking history: smokes 1 ppd, 25 pack year history  Inhalers: Albuterol PRN; states she uses it almost daily; she cannot afford daily inhalers.  Endorses exertional shortness of breath and wheezing worse in the heat  History of alcoholism & Alcoholic peripheral neuropathy  She last had drink this weekend, only drinks on the weekends now- drinks between 5-7 drinks of liquor. Patient takes gabapentin 3041mtwice a day and 60019md night time for neuropathy in bilateral legs and in bilateral hands.   Adrenal nodule  Follows up with Dr. CanCeline Ahrannot afford blood work and imaging just yet but will get it done when she can.   Elevated hemoglobin  Last CBC was normal in 2019, likely due to chronic smoking   Insomnia Takes elavil to help her sleep typically every night, does not take it if she drinks alcohol.   PHQ2/9: Depression [facility] 12/17/2018 03/03/2018 09/20/2017  Decreased Interest 0 0 0  Down, Depressed, Hopeless 0 0 1  PHQ - 2 Score 0 0 1  Altered sleeping 0 0 -  Tired, decreased energy 0 0 -  Change in appetite 0 0 -  Feeling bad or failure about yourself  0 0 -  Trouble concentrating 0 0 -  Moving slowly or  fidgety/restless 0 0 -  Suicidal thoughts 0 0 -  PHQ-9 Score 0 0 -  Difficult doing work/chores Not difficult at all Not difficult at all -     PHQ reviewed. Negative  Patient Active Problem List   Diagnosis Date Noted  . Elevated hemoglobin (HCCSeagraves6/26/2019  . Renal insufficiency 10/23/2017  . 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 Thoracic spinal stenosis 10/01/2017  . Adrenal nodule (HCCLoup6/07/2017  . Tobacco use 09/25/2017  . Hypomagnesemia 04/28/2015  . Elevated alkaline phosphatase level 01/25/2015  . Stress due to family tension 01/25/2015  . Folic acid deficiency 09/74/25/9563 Chronic alcoholism in remission ([place]CC[place]ra[place] . Macrocytosis   . Chronic alcoholic hepatitis   . Abnormal gamma-glutamyl transferase test   . GERD (gastroesophageal reflux disease)   . Asthma   . COPD (chronic obstructive pulmonary disease) (HCCWest [place] . Hypertension   . Vitamin D deficiency disease   . Adjustment disorder with mixed anxiety and depressed mood   . Alcohol abuse, in remission 04/08/2013  . Alcoholic peripheral neuropathy (HCCWhitney2/01/2013  . 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 Tobacco abuse 04/08/2013  . Cervical neuralgia 04/08/2013  . Occipital neuralgia 04/08/2013  . Gait difficulty 04/08/2013  . Elevated liver enzymes 04/30/2012    Past Medical History:  Diagnosis Date  . Abnormal gamma-glutamyl transferase test   .  Adjustment disorder with mixed anxiety and depressed mood   . Adrenal nodule ([place]) 10/01/2017   Due for repeat CT scan June 2020  . Alcohol abuse   . Alcoholic peripheral neuropathy ([place])   . Anemia   . Arthritis   . Asthma   . Cardiac murmur   . Chronic alcoholic hepatitis   . Chronic alcoholism ([place])   . Chronic alcoholism in remission ([place])   . Chronic back pain   . COPD (chronic obstructive pulmonary disease) ([place])   . Elevated liver enzymes Jan 2014   referred to and seen by GI  . Fatty liver   . GERD (gastroesophageal  reflux disease)   . Hiatal hernia   . Hypertension   . Macrocytosis    alcoholic  . Ruptured tubal pregnancy   . 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 Thoracic spinal stenosis 10/01/2017  . Tobacco abuse   . Vitamin D deficiency disease     Past Surgical History:  Procedure Laterality Date  . FINGER SURGERY    . LAPAROSCOPIC OOPHERECTOMY Right   . TUBAL LIGATION  1992    Social History   Tobacco Use  . Smoking status: Current Every Day Smoker    Packs/day: 1.00    Years: 25.00    Pack years: 25.00    Types: Cigarettes  . Smokeless tobacco: Never Used  Substance Use Topics  . Alcohol use: Yes    Comment: very seldom     Current Outpatient Medications:  .  albuterol (PROVENTIL HFA;VENTOLIN HFA) 108 (90 Base) MCG/ACT inhaler, Inhale 2 puffs into the lungs every 4 (four) hours as needed for wheezing or shortness of breath., Disp: 1 Inhaler, Rfl: 3 .  amitriptyline (ELAVIL) 25 MG tablet, TAKE 1 TABLET BY MOUTH AT BEDTIME AS NEEDED FOR SLEEP, Disp: 90 tablet, Rfl: 1 .  amLODipine (NORVASC) 5 MG tablet, Take 1 tablet (5 mg total) by mouth daily., Disp: 30 tablet, Rfl: 0 .  cephALEXin (KEFLEX) 500 MG capsule, Take 1 capsule (500 mg total) by mouth 3 (three) times daily for 7 days., Disp: 21 capsule, Rfl: 0 .  gabapentin (NEURONTIN) 300 MG capsule, One by mouth every morning, one every afternoon, and two at bedtime, Disp: 120 capsule, Rfl: 0 .  omeprazole (PRILOSEC) 10 MG capsule, Take 1 capsule (10 mg total) by mouth daily., Disp: 30 capsule, Rfl: 5 .  budesonide-formoterol (SYMBICORT) 160-4.5 MCG/ACT inhaler, Inhale 2 puffs into the lungs 2 (two) times daily. Reported on 04/28/2015 (Patient not taking: Reported on 12/17/2018), Disp: 1 Inhaler, Rfl: 11 .  nitrofurantoin, macrocrystal-monohydrate, (MACROBID) 100 MG capsule, Take 1 capsule (100 mg total) by mouth 2 (two) times daily. (Patient not taking: Reported on 12/17/2018), Disp: 6 capsule, Rfl: 0  Allergies  Allergen Reactions  .  Latex   . Penicillins Hives  . Erythromycin Itching and Rash    ROS    No other specific complaints in a complete review of systems (except as listed in HPI above).  Objective  Vitals:   12/17/18 1428  BP: 136/72  Pulse: 89  Resp: 14  Temp: (!) 96.8 F (36 C)  SpO2: 98%  Weight: 146 lb 1.6 oz (66.3 kg)  Height: '5\' 4"'  (1.626 m)     Body mass index is 25.08 kg/m.  Nursing Note and Vital Signs reviewed.  Physical Exam Constitutional:      Appearance: Normal appearance. She is well-developed.  HENT:     Head: Normocephalic and atraumatic.     Right  Ear: Hearing normal.     Left Ear: Hearing normal.  Eyes:     Conjunctiva/sclera: Conjunctivae normal.  Cardiovascular:     Rate and Rhythm: Normal rate and regular rhythm.     Heart sounds: Normal heart sounds.  Pulmonary:     Effort: Pulmonary effort is normal.     Breath sounds: Normal breath sounds.  Musculoskeletal: Normal range of motion.  Skin:      Neurological:     Mental Status: She is alert and oriented to person, place, and time.  Psychiatric:        Speech: Speech normal.        Behavior: Behavior normal. Behavior is cooperative.        Thought Content: Thought content normal.        Judgment: Judgment normal.        No results found for this or any previous visit (from the past 48 hour(s)).  Assessment & Plan  1. Visit for suture removal Monitor for S&S for infection  - Suture removal kit  2. Essential hypertension stable - amLODipine (NORVASC) 5 MG tablet; Take 1 tablet (5 mg total) by mouth daily.  Dispense: 90 tablet; Refill: 3  3. Chronic obstructive pulmonary disease, unspecified COPD type ([place]) Work on getting patient assistance program for maintaince med - Ambulatory referral to [place]  4. Alcoholic peripheral neuropathy (HCC) Limit alcohol, continue gabapentin   5. Adrenal nodule ([place]) Follow up with endo   6. Elevated hemoglobin (HCC) Likely  due to smoking   7. Chronic alcoholism in remission [facility]) Not drinking regularly but work on limiting binging   8. Need for tetanus booster DID NOT GET TODAY, will come in tomorrow for tdap - Tdap vaccine greater than or equal to 7yo IM  9. Laceration of left lower extremity, subsequent encounter tdap tomorrow   10. Financial difficulties - Ambulatory referral to Chronic Care Management Services  11. Insomnia, unspecified type Elavil PRN

## 2018-12-17 NOTE — Patient Instructions (Addendum)
Try calling the [place] patient assistance program to see if we can get you coverage for Anoro inhaler TEL: [phone]  I put in a referral to our chronic care management team additionally, to see if they can help you as well.   If redness around leg does not improve or at any time worsens please let us know as you may need a different antibiotic.    Steps to Quit Smoking Smoking tobacco is the leading cause of preventable death. It can affect almost every organ in the body. Smoking puts you and those around you at risk for developing many serious chronic diseases. Quitting smoking can be difficult, but it is one of the best things that you can do for your health. It is never too late to quit. How do I get ready to quit? When you decide to quit smoking, create a plan to help you succeed. Before you quit:  Pick a date to quit. Set a date within the next 2 weeks to give you time to prepare.  Write down the reasons why you are quitting. Keep this list in places where you will see it often.  Tell your family, friends, and co-workers that you are quitting. Support from your loved ones can make quitting easier.  Talk with your health care provider about your options for quitting smoking.  Find out what treatment options are covered by your health insurance.  Identify people, places, things, and activities that make you want to smoke (triggers). Avoid them. What first steps can I take to quit smoking?  Throw away all cigarettes at home, at work, and in your car.  Throw away smoking accessories, such as Scientist, research (medical).  Clean your car. Make sure to empty the ashtray.  Clean your home, including curtains and carpets. What strategies can I use to quit smoking? Talk with your health care provider about combining strategies, such as taking medicines while you are also receiving in-person counseling. Using these two strategies together makes you more likely to succeed in quitting than if you  used either strategy on its own.  If you are pregnant or breastfeeding, talk with your health care provider about finding counseling or other support strategies to quit smoking. Do not take medicine to help you quit smoking unless your health care provider tells you to do so. To quit smoking: Quit right away  Quit smoking completely, instead of gradually reducing how much you smoke over a period of time. Research shows that stopping smoking right away is more successful than gradually quitting.  Attend in-person counseling to help you build problem-solving skills. You are more likely to succeed in quitting if you attend counseling sessions regularly. Even short sessions of 10 minutes can be effective. Take medicine You may take medicines to help you quit smoking. Some medicines require a prescription and some you can purchase over-the-counter. Medicines may have nicotine in them to replace the nicotine in cigarettes. Medicines may:  Help to stop cravings.  Help to relieve withdrawal symptoms. Your health care provider may recommend:  Nicotine patches, gum, or lozenges.  Nicotine inhalers or sprays.  Non-nicotine medicine that is taken by mouth. Find resources Find resources and support systems that can help you to quit smoking and remain smoke-free after you quit. These resources are most helpful when you use them often. They include:  Online chats with a Social worker.  Telephone quitlines.  Printed Furniture conservator/restorer.  Support groups or group counseling.  Text messaging programs.  Mobile phone  apps or applications. Use apps that can help you stick to your quit plan by providing reminders, tips, and encouragement. There are many free apps for mobile devices as well as websites. Examples include Quit Guide from the [employer] and smokefree.gov What things can I do to make it easier to quit?   Reach out to your family and friends for support and encouragement. Call telephone quitlines  (1-800-QUIT-NOW), reach out to support groups, or work with a counselor for support.  Ask people who smoke to avoid smoking around you.  Avoid places that trigger you to smoke, such as bars, parties, or smoke-break areas at work.  Spend time with people who do not smoke.  Lessen the stress in your life. Stress can be a smoking trigger for some people. To lessen stress, try: ? Exercising regularly. ? Doing deep-breathing exercises. ? Doing yoga. ? Meditating. ? Performing a body scan. This involves closing your eyes, scanning your body from head to toe, and noticing which parts of your body are particularly tense. Try to relax the muscles in those areas. How will I feel when I quit smoking? Day 1 to 3 weeks Within the first 24 hours of quitting smoking, you may start to feel withdrawal symptoms. These symptoms are usually most noticeable 2-3 days after quitting, but they usually do not last for more than 2-3 weeks. You may experience these symptoms:  Mood swings.  Restlessness, anxiety, or irritability.  Trouble concentrating.  Dizziness.  Strong cravings for sugary foods and nicotine.  Mild weight gain.  Constipation.  Nausea.  Coughing or a sore throat.  Changes in how the medicines that you take for unrelated issues work in your body.  Depression.  Trouble sleeping (insomnia). Week 3 and afterward After the first 2-3 weeks of quitting, you may start to notice more positive results, such as:  Improved sense of smell and taste.  Decreased coughing and sore throat.  Slower heart rate.  Lower blood pressure.  Clearer skin.  The ability to breathe more easily.  Fewer sick days. Quitting smoking can be very challenging. Do not get discouraged if you are not successful the first time. Some people need to make many attempts to quit before they achieve long-term success. Do your best to stick to your quit plan, and talk with your health care provider if you have any  questions or concerns. Summary  Smoking tobacco is the leading cause of preventable death. Quitting smoking is one of the best things that you can do for your health.  When you decide to quit smoking, create a plan to help you succeed.  Quit smoking right away, not slowly over a period of time.  When you start quitting, seek help from your health care provider, family, or friends. This information is not intended to replace advice given to you by your health care provider. Make sure you discuss any questions you have with your health care provider. Document Released: 04/10/2001 Document Revised: 07/04/2018 Document Reviewed: 07/05/2018 Elsevier Patient Education  2020 [employer].

## 2018-12-20 ENCOUNTER — Encounter: Payer: Self-pay | Admitting: Family Medicine

## 2018-12-22 ENCOUNTER — Other Ambulatory Visit: Payer: Self-pay

## 2018-12-22 ENCOUNTER — Ambulatory Visit (INDEPENDENT_AMBULATORY_CARE_PROVIDER_SITE_OTHER): Payer: Self-pay

## 2018-12-22 DIAGNOSIS — S81812D Laceration without foreign body, left lower leg, subsequent encounter: Secondary | ICD-10-CM

## 2018-12-22 DIAGNOSIS — Z23 Encounter for immunization: Secondary | ICD-10-CM

## 2018-12-22 MED ORDER — SULFAMETHOXAZOLE-TRIMETHOPRIM 800-160 MG PO TABS: 1.0000 | ORAL_TABLET | Freq: Two times a day (BID) | ORAL | 0 refills | Status: AC

## 2018-12-22 NOTE — Addendum Note (Signed)
Addended by: Marland Kitchen A on: 12/22/2018 10:41 AM   Modules accepted: Orders

## 2018-12-22 NOTE — Progress Notes (Signed)
Pt here for tetanus booster following laceration repaired in the ER on 12/10/2018 and sutures removed by myself and Suezanne Cheshire on 12/17/2018.   ER documented Tetanus was updated, but not in chart so it was done here today. Pt noted her wound looked worse and more red - the surrounding erythema and edema has improved, wound edges still closely approximated with 3 butterfly bandages applied.  The skin flap area has 3x2.5 cm area of eschar - nearly black with surrounding 1 cm of erythema and still evident bruising.  Some serous drainage, no purulence noted.  Some ttp to the area. The color change may be unvascularized tissue in the flap - but will send in bactrim to cover for MRSA.  Wound care reviewed with pt.  Asked her to send [place] a photo of the wound at the end of the week.  1. Laceration of left lower extremity, subsequent encounter Skin around wound improving, questionable wound infection to skin flap vs area of tissue w/o adequate blood supply, send picture in 4d via mychart, pt to follow up any worsening pain, redness, swelling or any new purulence - sulfamethoxazole-trimethoprim (BACTRIM DS) 800-160 MG tablet; Take 1 tablet by mouth 2 (two) times daily for 5 days.  Dispense: 10 tablet; Refill: 0  2. Need for Tdap vaccination Done today  Delsa Grana, PA-C 12/22/18 10:20 AM

## 2018-12-26 ENCOUNTER — Ambulatory Visit: Payer: Self-pay | Admitting: Pharmacist

## 2018-12-26 NOTE — Chronic Care Management (AMB) (Signed)
  Care Management   Note  12/26/2018 Name: Beth [name] MRN: [id number] DOB: 07/11/1970  48 y.o. year old female referred to Chronic Care Management by Delsa Grana, PA for medication assistance (COPD inhalers). Chronic conditions include COPD Last office visit with Delsa Grana, PA-C was 12/22/18.   Was unable to reach patient via telephone today and have left HIPAA compliant voicemail asking patient to return my call. (unsuccessful outreach #1).  Will follow up in 5-7 days.  Ruben Reason, PharmD Clinical Pharmacist [facility] [phone]

## 2019-01-06 ENCOUNTER — Encounter: Payer: Self-pay | Admitting: Family Medicine

## 2019-01-09 ENCOUNTER — Ambulatory Visit: Payer: Self-pay | Admitting: Pharmacist

## 2019-01-09 NOTE — Chronic Care Management (AMB) (Signed)
  Care Management   Note  01/09/2019 Name: Beth [name] MRN: [id number] DOB: 1970-10-17  48 y.o. year old female referred to Chronic Care Management by Delsa Grana, PA for medication assistance (COPD inhalers). Chronic conditions include COPD Last office visit with Delsa Grana, PA-C was 12/22/18.   Was unable to reach patient via telephone today and have left HIPAA compliant voicemail asking patient to return my call. (unsuccessful outreach #2).  Will follow up in 5-7 days.  Ruben Reason, PharmD Clinical Pharmacist [facility] [phone]

## 2019-01-20 ENCOUNTER — Ambulatory Visit: Payer: Self-pay | Admitting: *Deleted

## 2019-01-20 NOTE — Chronic Care Management (AMB) (Signed)
    Care Management   Unsuccessful Call Note 01/20/2019 Name: Beth [name] MRN: [id number] DOB: Jan 07, 1971  Patient is a 48 year old female who sees Delsa Grana, [place] for primary care. Delsa Grana,, PA-C asked the CCM team to consult the patient for disease management and medication assistance.       This social worker  was unable to reach patient via telephone today to introduce CM services and obtain consent. I have left HIPAA compliant voicemail asking patient to return my call. (unsuccessful outreach #3).   Plan: The CCM team will cease additional outreach calls at this time. The CCM team will be glad to engage patient upon her return call.    Elliot Gurney, [place] Administrator, [facility] Care Management [phone]

## 2019-01-22 ENCOUNTER — Encounter: Payer: Self-pay | Admitting: Family Medicine

## 2019-05-25 ENCOUNTER — Encounter: Payer: Self-pay | Admitting: Family Medicine

## 2019-05-27 ENCOUNTER — Ambulatory Visit: Payer: Medicaid Other | Admitting: Family Medicine

## 2019-06-02 ENCOUNTER — Ambulatory Visit: Payer: Medicaid Other | Admitting: Family Medicine

## 2019-06-15 ENCOUNTER — Ambulatory Visit: Payer: Medicaid Other | Admitting: Family Medicine

## 2019-07-01 ENCOUNTER — Encounter: Payer: Self-pay | Admitting: Family Medicine

## 2019-07-01 ENCOUNTER — Other Ambulatory Visit: Payer: Self-pay

## 2019-07-01 ENCOUNTER — Ambulatory Visit: Payer: Medicaid Other | Admitting: Family Medicine

## 2019-07-01 VITALS — BP 142/86 | HR 78 | Temp 98.1°F | Resp 14 | Ht 64.0 in | Wt 159.9 lb

## 2019-07-01 DIAGNOSIS — F1021 Alcohol dependence, in remission: Secondary | ICD-10-CM

## 2019-07-01 DIAGNOSIS — Z1231 Encounter for screening mammogram for malignant neoplasm of breast: Secondary | ICD-10-CM

## 2019-07-01 DIAGNOSIS — G4700 Insomnia, unspecified: Secondary | ICD-10-CM

## 2019-07-01 DIAGNOSIS — E278 Other specified disorders of adrenal gland: Secondary | ICD-10-CM

## 2019-07-01 DIAGNOSIS — K219 Gastro-esophageal reflux disease without esophagitis: Secondary | ICD-10-CM

## 2019-07-01 DIAGNOSIS — J449 Chronic obstructive pulmonary disease, unspecified: Secondary | ICD-10-CM

## 2019-07-01 DIAGNOSIS — I10 Essential (primary) hypertension: Secondary | ICD-10-CM

## 2019-07-01 DIAGNOSIS — R519 Headache, unspecified: Secondary | ICD-10-CM

## 2019-07-01 DIAGNOSIS — D582 Other hemoglobinopathies: Secondary | ICD-10-CM

## 2019-07-01 DIAGNOSIS — G621 Alcoholic polyneuropathy: Secondary | ICD-10-CM

## 2019-07-01 LAB — BASIC METABOLIC PANEL WITH GFR
BUN: 13 mg/dL (ref 7–25)
CO2: 29 mmol/L (ref 20–32)
Calcium: 9.2 mg/dL (ref 8.6–10.2)
Chloride: 105 mmol/L (ref 98–110)
Creat: 0.8 mg/dL (ref 0.50–1.10)
GFR, Est African American: 100 mL/min/{1.73_m2} (ref >=60)
GFR, Est Non African American: 87 mL/min/{1.73_m2} (ref >=60)
Glucose, Bld: 93 mg/dL (ref 65–99)
Potassium: 4.6 mmol/L (ref 3.5–5.3)
Sodium: 142 mmol/L (ref 135–146)

## 2019-07-01 MED ORDER — AMLODIPINE BESYLATE 10 MG PO TABS: 10.0000 mg | ORAL_TABLET | Freq: Every day | ORAL | 3 refills | Status: DC

## 2019-07-01 MED ORDER — SUMATRIPTAN SUCCINATE 50 MG PO TABS: ORAL_TABLET | ORAL | 2 refills | Status: DC

## 2019-07-01 MED ORDER — GABAPENTIN 300 MG PO CAPS: ORAL_CAPSULE | ORAL | 5 refills | Status: DC

## 2019-07-01 MED ORDER — TRAZODONE HCL 50 MG PO TABS: 25.0000 mg | ORAL_TABLET | Freq: Every evening | ORAL | 3 refills | Status: DC | PRN

## 2019-07-01 MED ORDER — OMEPRAZOLE 10 MG PO CPDR: 10.0000 mg | DELAYED_RELEASE_CAPSULE | Freq: Every day | ORAL | 5 refills | Status: DC

## 2019-07-01 MED ORDER — ALBUTEROL SULFATE HFA 108 (90 BASE) MCG/ACT IN AERS: 2.0000 | INHALATION_SPRAY | RESPIRATORY_TRACT | 3 refills | Status: DC | PRN

## 2019-07-01 NOTE — Progress Notes (Signed)
Patient ID: Beth [name], female    DOB: 01/07/1971, 49 y.o.   MRN: [id number]  PCP: Delsa Grana, PA-C  Chief Complaint  Patient presents with  . Headache    onset several months, come and go on a daily bases, located in back of head, with some blurry vision    Subjective:   Beth [name] is a 49 y.o. female, presents to clinic with CC of the following:  Headaches for the past year, intermittent, no known triggers and no hx of HA or migraines Pain/HA located to occiput midline, come on suddently, throbbing/pounding, often severe 8/10 pain, has to lay down, has some blurry vision occasionally associated with it and sometimes some associated phonophobia, has associated dizziness,  Treats by laying down and taking tylenol - she is on amitriptyline for insomnia, take prn 25 mg, prescribed over a year ago In a week she's having about 2 HAs  No waking from sleep No other changes or triggers Hasn't seen a specialist before.  She reports blurry vision has gradually worsened over years and no eye visit - vision screening today done was normal.   Patient Active Problem List   Diagnosis Date Noted  . Need for Tdap vaccination 12/22/2018  . Insomnia 12/17/2018  . Elevated hemoglobin ([place]) 10/23/2017  . Renal insufficiency 10/23/2017  . 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 Thoracic spinal stenosis 10/01/2017  . Adrenal nodule ([place]) 10/01/2017  . Tobacco use 09/25/2017  . Hypomagnesemia 04/28/2015  . Elevated alkaline phosphatase level 01/25/2015  . Stress due to family tension 01/25/2015  . Folic acid deficiency 123456  . Chronic alcoholism in remission ([place])   . Macrocytosis   . Chronic alcoholic hepatitis   . Abnormal gamma-glutamyl transferase test   . GERD (gastroesophageal reflux disease)   . Asthma   . COPD (chronic obstructive pulmonary disease) ([place])   . Hypertension   . Vitamin D deficiency disease   . Adjustment disorder with mixed anxiety and depressed  mood   . Alcoholic peripheral neuropathy ([place]) 04/08/2013  . 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 Tobacco abuse 04/08/2013  . Cervical neuralgia 04/08/2013  . Occipital neuralgia 04/08/2013  . Gait difficulty 04/08/2013  . Elevated liver enzymes 04/30/2012      Current Outpatient Medications:  .  albuterol (PROVENTIL HFA;VENTOLIN HFA) 108 (90 Base) MCG/ACT inhaler, Inhale 2 puffs into the lungs every 4 (four) hours as needed for wheezing or shortness of breath., Disp: 1 Inhaler, Rfl: 3 .  amitriptyline (ELAVIL) 25 MG tablet, TAKE 1 TABLET BY MOUTH AT BEDTIME AS NEEDED FOR SLEEP, Disp: 90 tablet, Rfl: 1 .  amLODipine (NORVASC) 5 MG tablet, Take 1 tablet (5 mg total) by mouth daily., Disp: 90 tablet, Rfl: 3 .  gabapentin (NEURONTIN) 300 MG capsule, One by mouth every morning, one every afternoon, and two at bedtime (Patient not taking: Reported on 07/01/2019), Disp: 120 capsule, Rfl: 0 .  omeprazole (PRILOSEC) 10 MG capsule, Take 1 capsule (10 mg total) by mouth daily. (Patient not taking: Reported on 07/01/2019), Disp: 30 capsule, Rfl: 5   Allergies  Allergen Reactions  . Latex   . Penicillins Hives  . Erythromycin Itching and Rash     Family History  Problem Relation Age of Onset  . Cancer Mother        lung  . Kidney failure Father   . Diabetes Father   . Hypertension Father   . Heart disease Father   . Heart attack  Father   . Cancer Brother        lung, liver  . Diabetes Brother   . Hypertension Brother   . Cancer Brother        lung, brain  . Hypertension Sister   . Diabetes Maternal Grandmother   . Hypertension Maternal Grandmother   . Hypertension Maternal Grandfather   . Hypertension Paternal Grandmother   . Hypertension Paternal Grandfather      Social History   Socioeconomic History  . Marital status: Single    Spouse name: Not on file  . Number of children: 0  . Years of education: 12th  . Highest education level: Not on  file  Occupational History    Employer: OTHER    Comment: n/a  Tobacco Use  . Smoking status: Current Every Day Smoker    Packs/day: 1.00    Years: 25.00    Pack years: 25.00    Types: Cigarettes  . Smokeless tobacco: Never Used  Substance and Sexual Activity  . Alcohol use: Yes    Comment: very seldom  . Drug use: No  . Sexual activity: Not Currently  Other Topics Concern  . Not on file  Social History Narrative   Patient lives at home with her family.   Caffeine Use: daily   Social Determinants of Health   Financial Resource Strain:   . Difficulty of Paying Living Expenses: Not on file  Food Insecurity:   . Worried About Charity fundraiser in the Last Year: Not on file  . Ran Out of Food in the Last Year: Not on file  Transportation Needs:   . Lack of Transportation (Medical): Not on file  . Lack of Transportation (Non-Medical): Not on file  Physical Activity:   . Days of Exercise per Week: Not on file  . Minutes of Exercise per Session: Not on file  Stress:   . Feeling of Stress : Not on file  Social Connections:   . Frequency of Communication with Friends and Family: Not on file  . Frequency of Social Gatherings with Friends and Family: Not on file  . Attends Religious Services: Not on file  . Active Member of Clubs or Organizations: Not on file  . Attends Archivist Meetings: Not on file  . Marital Status: Not on file  Intimate Partner Violence:   . Fear of Current or Ex-Partner: Not on file  . Emotionally Abused: Not on file  . Physically Abused: Not on file  . Sexually Abused: Not on file    Chart Review Today: I personally reviewed active problem list, medication list, allergies, family history, social history, health maintenance, notes from last encounter, lab results, imaging with the patient/caregiver today.   Review of Systems 10 Systems reviewed and are negative for acute change except as noted in the HPI.     Objective:   Vitals:    07/01/19 1133  BP: (!) 142/86  Pulse: 78  Resp: 14  Temp: 98.1 F (36.7 C)  SpO2: 96%  Weight: 159 lb 14.4 oz (72.5 kg)  Height: 5\' 4"  (1.626 m)    Body mass index is 27.45 kg/m.  Physical Exam Vitals and nursing note reviewed.  Constitutional:      General: She is not in acute distress.    Appearance: Normal appearance. She is well-developed. She is not ill-appearing, toxic-appearing or diaphoretic.  HENT:     Head: Normocephalic and atraumatic.     Right Ear: External ear normal.  Left Ear: External ear normal.     Nose: Nose normal.  Eyes:     General:        Right eye: No discharge.        Left eye: No discharge.     Conjunctiva/sclera: Conjunctivae normal.  Neck:     Trachea: No tracheal deviation.  Cardiovascular:     Rate and Rhythm: Normal rate and regular rhythm.     Pulses: Normal pulses.     Heart sounds: Normal heart sounds. No murmur. No friction rub. No gallop.   Pulmonary:     Effort: Pulmonary effort is normal. No respiratory distress.     Breath sounds: Normal breath sounds. No stridor. No wheezing, rhonchi or rales.  Abdominal:     General: Bowel sounds are normal. There is no distension.     Palpations: Abdomen is soft.     Tenderness: There is no abdominal tenderness.  Musculoskeletal:        General: Normal range of motion.     Cervical back: Normal range of motion. No rigidity.  Lymphadenopathy:     Cervical: No cervical adenopathy.  Skin:    General: Skin is warm and dry.     Coloration: Skin is not jaundiced or pale.     Findings: No rash.  Neurological:     Mental Status: She is alert.     Motor: No abnormal muscle tone.     Coordination: Coordination normal.     Gait: Gait abnormal (slightly ataxic gait secondary to chronic diabetic neuropathy).     Comments: LANG/SPEECH: Naming and repetition intact, fluent, no dysarthria, follows 3-step commands, answers questions appropriately  CRANIAL NERVES:   II: Pupils equal and  reactive, no RAPD, VF deficits   III, IV, VI: EOM intact, no gaze preference or deviation, no nystagmus.   V: normal sensation in V1, V2, and V3 segments bilaterally   VII: no asymmetry, no nasolabial fold flattening   VIII: normal hearing to speech   IX, X: normal palatal elevation, no uvular deviation   XI: 5/5 head turn and 5/5 shoulder shrug bilaterally   XII: midline tongue protrusion  MOTOR:  5/5 bilateral grip strength 5/5 strength dorsiflexion/plantarflexion b/l  SENSORY:  Normal to light touch Romberg absent  COORD: Normal finger to nose and heel to shin, no tremor, no dysmetria  STATION: normal stance, no truncal ataxia  GAIT: Normal heel-walk, some difficulty with heel-toe walk   Psychiatric:        Behavior: Behavior normal.            Assessment & Plan:    1. Acute nonintractable headache, unspecified headache type Grossly normal neuro exam occipital HA - keep HA journal, try and get eyes checked, trial of imitrex, red flags reviewed and hand out printed for pt, possibly [facility]? The sudden onset is slightly concerning and pt was urged to go to the ER with severe sx with sudden onset to be able to get assessed and possibly get imaging?  She is cash pay and will not go to specialist right now or proceed with imaging - though I discussed with her how that is what I would like to do with her presentation  Trial of imitrex, reduce use of other OTC meds, push fluids, get enough sleep - SUMAtriptan (IMITREX) 50 MG tablet; Take 25-50 mg PO at onset of headache and can repeat in 2 hours if headache persists or recurs max dose 200 mg in 24 hours  Dispense:  30 tablet; Refill: 2  Other conditions she did not present for, she needed med refills - printed for best cash prices  2. Alcoholic peripheral neuropathy ([facility]) Unchanged - off gabapentin - ran out, gradual titration back onto meds - gabapentin (NEURONTIN) 300 MG capsule; One by mouth every morning, one every  afternoon, and two at bedtime  Dispense: 120 capsule; Refill: 5  3. Chronic obstructive pulmonary disease, unspecified COPD type ([place]) Refilled inhalers, well controlled per pt, stable - albuterol (VENTOLIN HFA) 108 (90 Base) MCG/ACT inhaler; Inhale 2 puffs into the lungs every 4 (four) hours as needed for wheezing or shortness of breath.  Dispense: 18 g; Refill: 3 4. Chronic alcoholism in remission ([place] in remission per pt  5. Adrenal nodule ([facility])  6. Elevated hemoglobin ([facility]) - no labs due to cash pay    7. Essential hypertension Elevated - pt out of meds, check renal function, med refilled and dose of norvasc increased - BASIC METABOLIC PANEL WITH GFR - amLODipine (NORVASC) 10 MG tablet; Take 1 tablet (10 mg total) by mouth daily.  Dispense: 90 tablet; Refill: 3  8. Encounter for screening mammogram for breast cancer - MM 3D SCREEN BREAST BILATERAL; Future   9. Insomnia, unspecified type Med refill - traZODone (DESYREL) 50 MG tablet; Take 0.5-1 tablets (25-50 mg total) by mouth at bedtime as needed for sleep.  Dispense: 30 tablet; Refill: 3  10. Gastroesophageal reflux disease, unspecified whether esophagitis present Med refill, well controlled on prilosec - omeprazole (PRILOSEC) 10 MG capsule; Take 1 capsule (10 mg total) by mouth daily.  Dispense: 30 capsule; Refill: [place], PA-C 07/01/19 11:46 AM

## 2019-07-01 NOTE — Patient Instructions (Signed)
Goal is to keep BP between 100/60 and 130/80   Try trazodone at night for sleep  Try the imitrex to treat headaches  Imitrex.  Can use 25-100 mg by mouth once at the onset of headache, and if it doesn't resolve can take a second dose one hour later.  Max dose in 24 hours is 200 mg  General Headache Without Cause A headache is pain or discomfort felt around the head or neck area. The specific cause of a headache may not be found. There are many causes and types of headaches. A few common ones are:  Tension headaches.  Migraine headaches.  Cluster headaches.  Chronic daily headaches. Follow these instructions at home: Watch your condition for any changes. Let your health care provider know about them. Take these steps to help with your condition: Managing pain      Take over-the-counter and prescription medicines only as told by your health care provider.  Lie down in a dark, quiet room when you have a headache.  If directed, put ice on your head and neck area: ? Put ice in a plastic bag. ? Place a towel between your skin and the bag. ? Leave the ice on for 20 minutes, 2-3 times per day.  If directed, apply heat to the affected area. Use the heat source that your health care provider recommends, such as a moist heat pack or a heating pad. ? Place a towel between your skin and the heat source. ? Leave the heat on for 20-30 minutes. ? Remove the heat if your skin turns bright red. This is especially important if you are unable to feel pain, heat, or cold. You may have a greater risk of getting burned.  Keep lights dim if bright lights bother you or make your headaches worse. Eating and drinking  Eat meals on a regular schedule.  If you drink alcohol: ? Limit how much you use to:  0-1 drink a day for women.  0-2 drinks a day for men. ? Be aware of how much alcohol is in your drink. In the [place] one drink equals one 12 oz bottle of beer (355 mL), one 5 oz glass of wine  (148 mL), or one 1 oz glass of hard liquor (44 mL).  Stop drinking caffeine, or decrease the amount of caffeine you drink. General instructions   Keep a headache journal to help find out what may trigger your headaches. For example, write down: ? What you eat and drink. ? How much sleep you get. ? Any change to your diet or medicines.  Try massage or other relaxation techniques.  Limit stress.  Sit up straight, and do not tense your muscles.  Do not use any products that contain nicotine or tobacco, such as cigarettes, e-cigarettes, and chewing tobacco. If you need help quitting, ask your health care provider.  Exercise regularly as told by your health care provider.  Sleep on a regular schedule. Get 7-9 hours of sleep each night, or the amount recommended by your health care provider.  Keep all follow-up visits as told by your health care provider. This is important. Contact a health care provider if:  Your symptoms are not helped by medicine.  You have a headache that is different from the usual headache.  You have nausea or you vomit.  You have a fever. Get help right away if:  Your headache becomes severe quickly.  Your headache gets worse after moderate to intense physical activity.  You  have repeated vomiting.  You have a stiff neck.  You have a loss of vision.  You have problems with speech.  You have pain in the eye or ear.  You have muscular weakness or loss of muscle control.  You lose your balance or have trouble walking.  You feel faint or pass out.  You have confusion.  You have a seizure. Summary  A headache is pain or discomfort felt around the head or neck area.  There are many causes and types of headaches. In some cases, the cause may not be found.  Keep a headache journal to help find out what may trigger your headaches. Watch your condition for any changes. Let your health care provider know about them.  Contact a health care  provider if you have a headache that is different from the usual headache, or if your symptoms are not helped by medicine.  Get help right away if your headache becomes severe, you vomit, you have a loss of vision, you lose your balance, or you have a seizure. This information is not intended to replace advice given to you by your health care provider. Make sure you discuss any questions you have with your health care provider. Document Revised: 11/04/2017 Document Reviewed: 11/04/2017 Elsevier Patient Education  [place].

## 2019-07-10 ENCOUNTER — Encounter: Payer: Self-pay | Admitting: Family Medicine

## 2019-11-18 ENCOUNTER — Other Ambulatory Visit: Payer: Self-pay | Admitting: Family Medicine

## 2019-11-18 DIAGNOSIS — G4700 Insomnia, unspecified: Secondary | ICD-10-CM

## 2019-12-31 ENCOUNTER — Other Ambulatory Visit: Payer: Self-pay | Admitting: Family Medicine

## 2019-12-31 DIAGNOSIS — G4700 Insomnia, unspecified: Secondary | ICD-10-CM

## 2020-01-12 ENCOUNTER — Encounter: Payer: Self-pay | Admitting: Family Medicine

## 2020-01-12 ENCOUNTER — Other Ambulatory Visit: Payer: Self-pay | Admitting: Family Medicine

## 2020-01-12 DIAGNOSIS — G4700 Insomnia, unspecified: Secondary | ICD-10-CM

## 2020-01-14 NOTE — Telephone Encounter (Signed)
Please see my previous note to you  Please let me know the pt preference so I can do the refills  Delsa Grana, PA-C

## 2020-01-15 MED ORDER — TRAZODONE HCL 50 MG PO TABS: ORAL_TABLET | ORAL | 3 refills | Status: DC

## 2020-01-15 NOTE — Addendum Note (Signed)
Addended by: Delsa Grana on: 01/15/2020 04:36 PM   Modules accepted: Orders

## 2020-01-15 NOTE — Telephone Encounter (Signed)
Left detailed vm °

## 2020-02-16 ENCOUNTER — Other Ambulatory Visit: Payer: Self-pay | Admitting: Family Medicine

## 2020-02-16 DIAGNOSIS — K219 Gastro-esophageal reflux disease without esophagitis: Secondary | ICD-10-CM

## 2020-02-16 DIAGNOSIS — G621 Alcoholic polyneuropathy: Secondary | ICD-10-CM

## 2020-02-16 NOTE — Telephone Encounter (Signed)
appt scheduled for 10.25 with [facility]

## 2020-02-22 ENCOUNTER — Ambulatory Visit: Payer: Medicaid Other | Admitting: Family Medicine

## 2020-04-07 ENCOUNTER — Other Ambulatory Visit: Payer: Self-pay | Admitting: Family Medicine

## 2020-04-07 DIAGNOSIS — G621 Alcoholic polyneuropathy: Secondary | ICD-10-CM

## 2020-07-09 ENCOUNTER — Telehealth: Payer: Self-pay | Admitting: Family Medicine

## 2020-07-09 DIAGNOSIS — I10 Essential (primary) hypertension: Secondary | ICD-10-CM

## 2020-07-11 NOTE — Telephone Encounter (Signed)
lvm for pt to call the office and schedule an appt 

## 2020-08-08 ENCOUNTER — Other Ambulatory Visit: Payer: Self-pay | Admitting: Family Medicine

## 2020-08-08 DIAGNOSIS — I10 Essential (primary) hypertension: Secondary | ICD-10-CM

## 2020-08-08 NOTE — Telephone Encounter (Signed)
lvm that patient needs an appt for medrefill per doctor

## 2020-08-08 NOTE — Telephone Encounter (Signed)
   Last refill: 07/11/2020  Future visit scheduled: no  Notes to clinic:  courtesy refill already given  Review for another refill Message has been sent to patient for appt    Requested Prescriptions  Pending Prescriptions Disp Refills   amLODipine (NORVASC) 10 MG tablet [Pharmacy Med Name: amLODIPine Besylate 10 MG Oral Tablet] 60 tablet 0    Sig: Take 1 tablet by mouth once daily      Cardiovascular:  Calcium Channel Blockers Failed - 08/08/2020  1:18 AM      Failed - Last BP in normal range    BP Readings from Last 1 Encounters:  07/01/19 (!) 142/86          Failed - Valid encounter within last 6 months    Recent Outpatient Visits           1 year ago Acute nonintractable headache, unspecified headache type   [facility] Delsa Grana, PA-C   1 year ago Laceration of left lower extremity, subsequent encounter   [facility]   1 year ago Visit for suture removal   [place], NP   2 years ago Burning with urination   [facility] [place], Satira Anis, MD   2 years ago Essential hypertension   [facility] [place], Satira Anis, MD

## 2020-08-08 NOTE — Telephone Encounter (Signed)
Patient needs an appointment

## 2020-08-11 ENCOUNTER — Telehealth: Payer: Self-pay

## 2020-08-11 NOTE — Telephone Encounter (Signed)
Please schedule patient appointment and see what options she can have to pay for visit

## 2020-08-11 NOTE — Telephone Encounter (Signed)
FYI: Patient states that she know she needs to schedule appointment but can't afford it right now.

## 2020-08-11 NOTE — Telephone Encounter (Signed)
lvm for pt to return call. If pt is self pay she can schedule appt and pay $50 up front then we can bill the remainder amount or if she doesn't have insurance [facility] can [facility] there.

## 2020-10-09 ENCOUNTER — Other Ambulatory Visit: Payer: Self-pay | Admitting: Family Medicine

## 2020-10-09 DIAGNOSIS — G4700 Insomnia, unspecified: Secondary | ICD-10-CM

## 2020-10-09 DIAGNOSIS — I10 Essential (primary) hypertension: Secondary | ICD-10-CM

## 2020-10-09 NOTE — Telephone Encounter (Signed)
Requested Prescriptions  Pending Prescriptions Disp Refills  . amLODipine (NORVASC) 10 MG tablet [Pharmacy Med Name: amLODIPine Besylate 10 MG Oral Tablet] 8 tablet 0    Sig: Take 1 tablet by mouth once daily     Cardiovascular:  Calcium Channel Blockers Failed - 10/09/2020  5:53 AM      Failed - Last BP in normal range    BP Readings from Last 1 Encounters:  07/01/19 (!) 142/86         Failed - Valid encounter within last 6 months    Recent Outpatient Visits          1 year ago Acute nonintractable headache, unspecified headache type   [facility] Delsa Grana, PA-C   1 year ago Laceration of left lower extremity, subsequent encounter   [facility]   1 year ago Visit for suture removal   [place], NP   2 years ago Burning with urination   [place], Satira Anis, MD   2 years ago Essential hypertension   [place], Satira Anis, MD      Future Appointments            In 1 week Delsa Grana, PA-C [facility], [facility]

## 2020-10-17 ENCOUNTER — Ambulatory Visit: Payer: Self-pay | Admitting: Family Medicine

## 2020-10-17 ENCOUNTER — Other Ambulatory Visit: Payer: Self-pay

## 2020-10-17 NOTE — Progress Notes (Signed)
Patient ID: Beth [name], female    DOB: June 05, 1970, 50 y.o.   MRN: [id number]  PCP: Delsa Grana, PA-C  No chief complaint on file.   Subjective:   Beth [name] is a 50 y.o. female, presents to clinic with CC of the following:  HPI    Patient Active Problem List   Diagnosis Date Noted   Need for Tdap vaccination 12/22/2018   Insomnia 12/17/2018   Elevated hemoglobin ([place]) 10/23/2017   Renal insufficiency 10/23/2017   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   Thoracic spinal stenosis 10/01/2017   Adrenal nodule ([place]) 10/01/2017   Tobacco use 09/25/2017   Hypomagnesemia 04/28/2015   Elevated alkaline phosphatase level 01/25/2015   Stress due to family tension 95/12/3265   Folic acid deficiency 12/45/8099   Chronic alcoholism in remission ([place])    Macrocytosis    Chronic alcoholic hepatitis    Abnormal gamma-glutamyl transferase test    GERD (gastroesophageal reflux disease)    Asthma    COPD (chronic obstructive pulmonary disease) ([place])    Hypertension    Vitamin D deficiency disease    Adjustment disorder with mixed anxiety and depressed mood    Alcoholic peripheral neuropathy ([place]) 04/08/2013   Subcortical microvascular ischemic occlusive disease 04/08/2013   Tobacco abuse 04/08/2013   Cervical neuralgia 04/08/2013   Occipital neuralgia 04/08/2013   Gait difficulty 04/08/2013   Elevated liver enzymes 04/30/2012      Current Outpatient Medications:    albuterol (VENTOLIN HFA) 108 (90 Base) MCG/ACT inhaler, Inhale 2 puffs into the lungs every 4 (four) hours as needed for wheezing or shortness of breath., Disp: 18 g, Rfl: 3   amLODipine (NORVASC) 10 MG tablet, Take 1 tablet by mouth once daily, Disp: 8 tablet, Rfl: 0   gabapentin (NEURONTIN) 300 MG capsule, TAKE 1 CAPSULE BY MOUTH IN THE MORNING, 1 CAPSULE IN THE AFTERNOON, AND 2 CAPSULES AT BEDTIME, Disp: 120 capsule, Rfl: 4   omeprazole (PRILOSEC) 10 MG capsule, Take 1 capsule (10 mg total) by mouth daily.,  Disp: 30 capsule, Rfl: 5   SUMAtriptan (IMITREX) 50 MG tablet, Take 25-50 mg PO at onset of headache and can repeat in 2 hours if headache persists or recurs max dose 200 mg in 24 hours, Disp: 30 tablet, Rfl: 2   traZODone (DESYREL) 50 MG tablet, TAKE 1/2 TO 1 (ONE-HALF TO ONE) TABLET BY MOUTH AT BEDTIME AS NEEDED FOR SLEEP, Disp: 90 tablet, Rfl: 0   Allergies  Allergen Reactions   Latex    Penicillins Hives   Erythromycin Itching and Rash     Social History   Tobacco Use   Smoking status: Every Day    Packs/day: 1.00    Years: 25.00    Pack years: 25.00    Types: Cigarettes   Smokeless tobacco: Never  Vaping Use   Vaping Use: Never used  Substance Use Topics   Alcohol use: Yes    Comment: very seldom   Drug use: No      Chart Review Today: I personally reviewed active problem list, medication list, allergies, family history, social history, health maintenance, notes from last encounter, lab results, imaging with the patient/caregiver today.   Review of Systems     Objective:   There were no vitals filed for this visit.  There is no height or weight on file to calculate BMI.  Physical Exam   Results for orders placed or performed in visit on 83/38/25  BASIC METABOLIC PANEL WITH GFR  Result Value Ref Range   Glucose, Bld 93 65 - 99 mg/dL   BUN 13 7 - 25 mg/dL   Creat 0.80 0.50 - 1.10 mg/dL   GFR, Est Non African American 87 > OR = 60 mL/min/1.38m2   GFR, Est African American 100 > OR = 60 mL/min/1.28m2   BUN/Creatinine Ratio NOT APPLICABLE 6 - 22 (calc)   Sodium 142 135 - 146 mmol/L   Potassium 4.6 3.5 - 5.3 mmol/L   Chloride 105 98 - 110 mmol/L   CO2 29 20 - 32 mmol/L   Calcium 9.2 8.6 - 10.2 mg/dL   Health Maintenance  Topic Date Due   COVID-19 Vaccine (1) Never done   Pneumococcal Vaccine 76-89 Years old (1 - PCV) Never done   MAMMOGRAM  Never done   Hepatitis C Screening  Never done   PAP SMEAR-Modifier  Never done   COLONOSCOPY (Pts 45-77yrs  Insurance coverage will need to be confirmed)  Never done   Zoster Vaccines- Shingrix (1 of 2) Never done   INFLUENZA VACCINE  11/28/2020   TETANUS/TDAP  12/21/2028   HIV Screening  Completed   HPV VACCINES  Aged Out       Assessment & Plan:     ICD-10-CM   1. No-show for appointment  Z53.29     2. Primary hypertension  E26 COMPLETE METABOLIC PANEL WITH GFR    amLODipine (NORVASC) 10 MG tablet    3. Insomnia, unspecified type  G47.00 traZODone (DESYREL) 50 MG tablet    4. Alcoholic peripheral neuropathy (HCC)  G62.1 gabapentin (NEURONTIN) 300 MG capsule    5. Chronic obstructive pulmonary disease, unspecified COPD type (HCC)  J44.9 albuterol (VENTOLIN HFA) 108 (90 Base) MCG/ACT inhaler    6. Gastroesophageal reflux disease, unspecified whether esophagitis present  K21.9     7. Acute nonintractable headache, unspecified headache type  R51.9     8. Thoracic spinal stenosis  M48.04     9. Back pain, unspecified back location, unspecified back pain laterality, unspecified chronicity  M54.9     10. Current smoker  F17.200     11. Encounter for medication monitoring  Z51.81 CBC with Differential/Platelet    COMPLETE METABOLIC PANEL WITH GFR    12. Encounter for screening mammogram for malignant neoplasm of breast  Z12.31 MM 3D SCREEN BREAST BILATERAL    13. Encounter for hepatitis C screening test for low risk patient  Z11.59 Hepatitis C antibody    14. Screening for HIV without presence of risk factors  Z11.4 HIV Antibody (routine testing w rflx)    15. Alcoholic peripheral neuropathy (HCC) Chronic G62.1 gabapentin (NEURONTIN) 300 MG capsule    16. Chronic obstructive pulmonary disease, unspecified COPD type (HCC) Chronic J44.9 albuterol (VENTOLIN HFA) 108 (90 Base) MCG/ACT inhaler    17. Essential hypertension  I10       Multiple no-shows, difficulty with transportation Last labs over a year ago - barriers to care - cost of labs - pt may consider community health center  or [facility] to be able to get more care/labs/meds with supplemented costs   Chemistry      Component Value Date/Time   NA 142 07/01/2019 1216   NA 138 04/28/2015 0951   NA 141 02/05/2012 1531   K 4.6 07/01/2019 1216   K 4.2 02/05/2012 1531   CL 105 07/01/2019 1216   CL 107 02/05/2012 1531   CO2 29 07/01/2019 1216   CO2 26 02/05/2012 1531  BUN 13 07/01/2019 1216   BUN 14 04/28/2015 0951   BUN 10 02/05/2012 1531   CREATININE 0.80 07/01/2019 1216      Component Value Date/Time   CALCIUM 9.2 07/01/2019 1216   CALCIUM 8.4 (L) 02/05/2012 1531   ALKPHOS 263 (H) 04/28/2015 0951   ALKPHOS 239 (H) 02/05/2012 1531   AST 19 03/03/2018 1215   AST 98 (H) 02/05/2012 1531   ALT 11 03/03/2018 1215   ALT 43 02/05/2012 1531   BILITOT 0.4 03/03/2018 1215   BILITOT <0.2 04/28/2015 0951   BILITOT 0.2 02/05/2012 1531      It will be unsafe and difficult to continue to see her when she is unable to do or afford labs  Labs were ordered and med refills were put in for 6 months   She is due for exam, overdue for labs, multiple screenings, multiple no shows - will unfortunately have to discharge pt if unable to come or unable to be compliant with f/up and needed labs - overall if financial reasons are the cause then pt may be able to get all needed and appropriate care at a subsidized/federally qualified health center or community health center where underinsured and uninsured are able to be seen - [employer] centers - [place], or [facility] may be options   Delsa Grana, PA-C 10/17/20 12:06 PM

## 2020-10-18 MED ORDER — TRAZODONE HCL 50 MG PO TABS: ORAL_TABLET | ORAL | 1 refills | Status: DC

## 2020-10-18 MED ORDER — ALBUTEROL SULFATE HFA 108 (90 BASE) MCG/ACT IN AERS: 2.0000 | INHALATION_SPRAY | RESPIRATORY_TRACT | 3 refills | Status: DC | PRN

## 2020-10-18 MED ORDER — AMLODIPINE BESYLATE 10 MG PO TABS: 1.0000 | ORAL_TABLET | Freq: Every day | ORAL | 1 refills | Status: DC

## 2020-10-18 MED ORDER — GABAPENTIN 300 MG PO CAPS: ORAL_CAPSULE | ORAL | 1 refills | Status: DC

## 2020-10-24 ENCOUNTER — Ambulatory Visit: Payer: Self-pay | Admitting: Family Medicine

## 2021-01-31 ENCOUNTER — Encounter: Payer: Self-pay | Admitting: General Surgery

## 2021-04-16 ENCOUNTER — Other Ambulatory Visit: Payer: Self-pay | Admitting: Family Medicine

## 2021-04-16 DIAGNOSIS — I10 Essential (primary) hypertension: Secondary | ICD-10-CM

## 2021-04-16 NOTE — Telephone Encounter (Signed)
Sent pt MyChart message to make appt. Last OV was over 1 year ago.  Requested Prescriptions  Pending Prescriptions Disp Refills   amLODipine (NORVASC) 10 MG tablet [Pharmacy Med Name: amLODIPine Besylate 10 MG Oral Tablet] 90 tablet 0    Sig: Take 1 tablet by mouth once daily     Cardiovascular:  Calcium Channel Blockers Failed - 04/16/2021  2:57 AM      Failed - Last BP in normal range    BP Readings from Last 1 Encounters:  07/01/19 (!) 142/86          Failed - Valid encounter within last 6 months    Recent Outpatient Visits           6 months ago No-show for appointment   [facility] Delsa Grana, PA-C   1 year ago Acute nonintractable headache, unspecified headache type   [facility] Delsa Grana, PA-C   2 years ago Laceration of left lower extremity, subsequent encounter   [facility]   2 years ago Visit for suture removal   [place], NP   3 years ago Burning with urination   [facility] [place], Satira Anis, MD

## 2021-04-19 ENCOUNTER — Other Ambulatory Visit: Payer: Self-pay | Admitting: Family Medicine

## 2021-04-19 DIAGNOSIS — I10 Essential (primary) hypertension: Secondary | ICD-10-CM

## 2021-04-19 NOTE — Telephone Encounter (Signed)
Appt made

## 2021-04-21 ENCOUNTER — Ambulatory Visit: Payer: Medicaid Other | Admitting: Internal Medicine

## 2021-04-25 ENCOUNTER — Ambulatory Visit: Payer: Self-pay | Admitting: Internal Medicine

## 2021-04-25 ENCOUNTER — Ambulatory Visit
Admission: RE | Admit: 2021-04-25 | Discharge: 2021-04-25 | Disposition: A | Discharge status: Home / Self Care | Payer: Self-pay | Attending: Internal Medicine | Admitting: Internal Medicine

## 2021-04-25 ENCOUNTER — Ambulatory Visit
Admission: RE | Admit: 2021-04-25 | Discharge: 2021-04-25 | Disposition: A | Discharge status: Home / Self Care | Payer: Self-pay | Source: Ambulatory Visit | Attending: Internal Medicine | Admitting: Internal Medicine

## 2021-04-25 ENCOUNTER — Encounter: Payer: Self-pay | Admitting: Internal Medicine

## 2021-04-25 VITALS — BP 134/70 | HR 83 | Temp 98.1°F | Resp 16 | Ht 64.0 in | Wt 168.4 lb

## 2021-04-25 DIAGNOSIS — J449 Chronic obstructive pulmonary disease, unspecified: Secondary | ICD-10-CM

## 2021-04-25 DIAGNOSIS — R052 Subacute cough: Secondary | ICD-10-CM

## 2021-04-25 DIAGNOSIS — I10 Essential (primary) hypertension: Secondary | ICD-10-CM

## 2021-04-25 DIAGNOSIS — J441 Chronic obstructive pulmonary disease with (acute) exacerbation: Secondary | ICD-10-CM

## 2021-04-25 DIAGNOSIS — Z23 Encounter for immunization: Secondary | ICD-10-CM

## 2021-04-25 DIAGNOSIS — K7010 Alcoholic hepatitis without ascites: Secondary | ICD-10-CM

## 2021-04-25 DIAGNOSIS — Z13220 Encounter for screening for lipoid disorders: Secondary | ICD-10-CM

## 2021-04-25 DIAGNOSIS — Z1159 Encounter for screening for other viral diseases: Secondary | ICD-10-CM

## 2021-04-25 DIAGNOSIS — Z114 Encounter for screening for human immunodeficiency virus [HIV]: Secondary | ICD-10-CM

## 2021-04-25 DIAGNOSIS — K219 Gastro-esophageal reflux disease without esophagitis: Secondary | ICD-10-CM

## 2021-04-25 MED ORDER — GUAIFENESIN ER 600 MG PO TB12: 600.0000 mg | ORAL_TABLET | Freq: Two times a day (BID) | ORAL | 0 refills | Status: DC

## 2021-04-25 MED ORDER — AMLODIPINE BESYLATE 10 MG PO TABS: 10.0000 mg | ORAL_TABLET | Freq: Every day | ORAL | 1 refills | Status: DC

## 2021-04-25 NOTE — Progress Notes (Signed)
Established Patient Office Visit  Subjective:  Patient ID: Beth [name], female    DOB: 01/14/1971  Age: 50 y.o. MRN: [id number]  CC:  Chief Complaint  Patient presents with   Follow-up   Hypertension   COPD   Gastroesophageal Reflux   Cough    Pt states has been having cough for several weeks and can not cough up the mucus.    HPI Beth [name] presents for follow up on chronic medical conditions.  Hypertension: -Medications: Amlodipine 10 mg -Patient is compliant with above medications and reports no side effects. -Checking BP at home (average): yes, 130/80 -Denies any SOB, CP, vision changes, LE edema or symptoms of hypotension  COPD: -COPD status: exacerbated -Current medications: Albuterol PRN -Satisfied with current treatment?: yes -Oxygen use: no -Dyspnea frequency: not usually but some with current symptoms, with exertion -Cough frequency: usually daily but worse lately with change in sputum color -Rescue inhaler frequency:  twice daily -Limitation of activity: no -Productive cough: yes, see below -Pneumovax: Up to Date -Influenza: Not up to Date  Alcohol Induced Cirrhosis/Neuropathy: -Last CMP 2019 -Currently on Gabapentin 300 mg  GERD: -Prilosec 10 mg OTC  URI Compliant:  -Worst symptom: cough, thick, hard to cough up couple weeks yellow production -Fever: no -Cough: yes -Shortness of breath: yes -Wheezing: yes -Chest pain: no -Chest tightness: no -Chest congestion: yes -Nasal congestion: yes -Runny nose: no -Post nasal drip: no -Sneezing: no -Sore throat: no -Swollen glands: no -Sinus pressure: no -Headache: no -Face pain: no -Treatments attempted: Symbicort daily, Albuterol PRN twice daily  Health Maintenance: -Blood work due -Mammogram ordered, not scheduled   Past Medical History:  Diagnosis Date   Abnormal gamma-glutamyl transferase test    Adjustment disorder with mixed anxiety and depressed mood    Adrenal nodule ([place])  10/01/2017   Due for repeat CT scan June 2020   Alcohol abuse    Alcoholic peripheral neuropathy (HCC)    Anemia    Arthritis    Asthma    Cardiac murmur    Chronic alcoholic hepatitis    Chronic alcoholism (HCC)    Chronic alcoholism in remission (HCC)    Chronic back pain    COPD (chronic obstructive pulmonary disease) (HCC)    Elevated liver enzymes Jan 2014   referred to and seen by GI   Fatty liver    GERD (gastroesophageal reflux disease)    Hiatal hernia    Hypertension    Macrocytosis    alcoholic   Ruptured tubal pregnancy    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   Thoracic spinal stenosis 10/01/2017   Tobacco abuse    Vitamin D deficiency disease     Past Surgical History:  Procedure Laterality Date   FINGER SURGERY     LAPAROSCOPIC OOPHERECTOMY Right    TUBAL LIGATION  1992    Family History  Problem Relation Age of Onset   Cancer Mother        lung   Kidney failure Father    Diabetes Father    Hypertension Father    Heart disease Father    Heart attack Father    Cancer Brother        lung, liver   Diabetes Brother    Hypertension Brother    Cancer Brother        lung, brain   Hypertension Sister    Diabetes Maternal Grandmother    Hypertension Maternal Grandmother    Hypertension Maternal Grandfather  Hypertension Paternal Grandmother    Hypertension Paternal Grandfather     Social History   Socioeconomic History   Marital status: Widowed    Spouse name: Not on file   Number of children: 0   Years of education: 12th   Highest education level: Not on file  Occupational History    Employer: OTHER    Comment: n/a  Tobacco Use   Smoking status: Every Day    Packs/day: 1.00    Years: 25.00    Pack years: 25.00    Types: Cigarettes   Smokeless tobacco: Never  Vaping Use   Vaping Use: Never used  Substance and Sexual Activity   Alcohol use: Yes    Comment: very seldom   Drug use: No   Sexual activity: Not Currently  Other Topics  Concern   Not on file  Social History Narrative   Patient lives at home with her family.   Caffeine Use: daily   Social Determinants of Health   Financial Resource Strain: Not on file  Food Insecurity: Not on file  Transportation Needs: Not on file  Physical Activity: Not on file  Stress: Not on file  Social Connections: Not on file  Intimate Partner Violence: Not on file    Outpatient Medications Prior to Visit  Medication Sig Dispense Refill   albuterol (VENTOLIN HFA) 108 (90 Base) MCG/ACT inhaler Inhale 2 puffs into the lungs every 4 (four) hours as needed for wheezing or shortness of breath. 18 g 3   amLODipine (NORVASC) 10 MG tablet Take 1 tablet (10 mg total) by mouth daily. 90 tablet 1   gabapentin (NEURONTIN) 300 MG capsule TAKE 1 CAPSULE BY MOUTH IN THE MORNING, 1 CAPSULE IN THE AFTERNOON, AND 2 CAPSULES AT BEDTIME 360 capsule 1   omeprazole (PRILOSEC) 10 MG capsule Take 1 capsule (10 mg total) by mouth daily. 30 capsule 5   SUMAtriptan (IMITREX) 50 MG tablet Take 25-50 mg PO at onset of headache and can repeat in 2 hours if headache persists or recurs max dose 200 mg in 24 hours 30 tablet 2   traZODone (DESYREL) 50 MG tablet TAKE 1/2 TO 1 (ONE-HALF TO ONE) TABLET BY MOUTH AT BEDTIME AS NEEDED FOR SLEEP 135 tablet 1   No facility-administered medications prior to visit.    Allergies  Allergen Reactions   Latex    Penicillins Hives   Erythromycin Itching and Rash    ROS Review of Systems  Constitutional:  Negative for chills and fever.  HENT:  Negative for congestion, postnasal drip, rhinorrhea, sinus pressure, sinus pain and sore throat.   Eyes:  Negative for visual disturbance.  Respiratory:  Positive for cough and shortness of breath. Negative for wheezing.   Cardiovascular:  Negative for chest pain and palpitations.  Neurological:  Negative for dizziness and headaches.     Objective:    Physical Exam Constitutional:      Appearance: Normal appearance.   HENT:     Head: Normocephalic and atraumatic.     Right Ear: Tympanic membrane and external ear normal.     Left Ear: Tympanic membrane and external ear normal.     Ears:     Comments: Dried blood in bilateral ear canals    Mouth/Throat:     Mouth: Mucous membranes are moist.     Pharynx: Oropharynx is clear.  Eyes:     Conjunctiva/sclera: Conjunctivae normal.  Cardiovascular:     Rate and Rhythm: Normal rate and regular rhythm.  Pulmonary:     Effort: Pulmonary effort is normal.     Breath sounds: No wheezing, rhonchi or rales.     Comments: Decreased air sounds in right base Musculoskeletal:     Right lower leg: No edema.     Left lower leg: No edema.  Skin:    General: Skin is warm and dry.  Neurological:     General: No focal deficit present.     Mental Status: She is alert. Mental status is at baseline.  Psychiatric:        Mood and Affect: Mood normal.        Behavior: Behavior normal.    BP 134/70    Pulse 83    Temp 98.1 F (36.7 C) (Oral)    Resp 16    Ht 5\' 4"  (1.626 m)    Wt 168 lb 6.4 oz (76.4 kg)    SpO2 95%    BMI 28.91 kg/m  Wt Readings from Last 3 Encounters:  04/25/21 168 lb 6.4 oz (76.4 kg)  07/01/19 159 lb 14.4 oz (72.5 kg)  12/17/18 146 lb 1.6 oz (66.3 kg)     Health Maintenance Due  Topic Date Due   COVID-19 Vaccine (1) Never done   MAMMOGRAM  Never done   Hepatitis C Screening  Never done   PAP SMEAR-Modifier  Never done   COLONOSCOPY (Pts 45-53yrs Insurance coverage will need to be confirmed)  Never done   Zoster Vaccines- Shingrix (1 of 2) Never done   INFLUENZA VACCINE  11/28/2020    There are no preventive care reminders to display for this patient.  Lab Results  Component Value Date   TSH 0.72 09/20/2017   Lab Results  Component Value Date   WBC 9.8 03/03/2018   HGB 15.3 03/03/2018   HCT 43.8 03/03/2018   MCV 93.8 03/03/2018   PLT 238 03/03/2018   Lab Results  Component Value Date   NA 142 07/01/2019   K 4.6 07/01/2019    CO2 29 07/01/2019   GLUCOSE 93 07/01/2019   BUN 13 07/01/2019   CREATININE 0.80 07/01/2019   BILITOT 0.4 03/03/2018   ALKPHOS 263 (H) 04/28/2015   AST 19 03/03/2018   ALT 11 03/03/2018   PROT 6.7 03/03/2018   ALBUMIN 4.1 04/28/2015   CALCIUM 9.2 07/01/2019   ANIONGAP 13 12/16/2014   No results found for: CHOL No results found for: HDL No results found for: LDLCALC No results found for: TRIG No results found for: [facility] Lab Results  Component Value Date   HGBA1C 5.1 09/20/2017      Assessment & Plan:   1. Primary hypertension: Stable, refills on Amlodipine today. Due for blood work.  - CBC w/Diff/Platelet - COMPLETE METABOLIC PANEL WITH GFR - amLODipine (NORVASC) 10 MG tablet; Take 1 tablet (10 mg total) by mouth daily.  Dispense: 90 tablet; Refill: 1  2. Chronic obstructive pulmonary disease, unspecified COPD type (HCC)/Subacute cough: COPD exacerbation vs. PNA. Chest x-ray today. Mucinex sent to help with cough but pending chest x-ray consider steroids and antibiotics.   - DG Chest 2 View; Future - guaiFENesin (MUCINEX) 600 MG 12 hr tablet; Take 1 tablet (600 mg total) by mouth 2 (two) times daily.  Dispense: 30 tablet; Refill: 0  3. Chronic alcoholic hepatitis: Repeat CMP today.  - COMPLETE METABOLIC PANEL WITH GFR  4. Gastroesophageal reflux disease, unspecified whether esophagitis present: Stable, currently taking OTC Prilosec.  5. Need for hepatitis C screening test/Encounter for screening  for HIV/ Lipid screening: Screen for Hepatitis C, HIV and lipid disorders.   - Hepatitis C Antibody - HIV antibody (with reflex) - Lipid Profile  6. Flu vaccine need: Flu vaccine today.  - Flu Vaccine QUAD 6+ mos PF IM (Fluarix Quad PF)  Follow-up: Return in about 6 months (around 10/24/2021).    Teodora Medici, DO

## 2021-04-25 NOTE — Patient Instructions (Addendum)
It was great seeing you today!  Plan discussed at today's visit: -Blood work ordered today, results will be uploaded to [facility] sent today -Chest x-ray today as well -Use Mucinex to help cough up chest congestion, start with 600 mg but can take 2 tablets  -Please call [phone] to schedule mammogram   Follow up in: 6 months   Take care and let us know if you have any questions or concerns prior to your next visit.  Dr. Rosana Berger

## 2021-04-26 LAB — HEPATITIS C ANTIBODY
Hepatitis C Ab: NONREACTIVE
SIGNAL TO CUT-OFF: <0.02 (ref <1.00)

## 2021-04-26 LAB — LIPID PANEL
Cholesterol: 163 mg/dL (ref <200)
HDL: 88 mg/dL (ref >=50)
LDL Cholesterol (Calc): 62 mg/dL (calc)
Non-HDL Cholesterol (Calc): 75 mg/dL (calc) (ref <130)
Total CHOL/HDL Ratio: 1.9 (calc) (ref <5.0)
Triglycerides: 58 mg/dL (ref <150)

## 2021-04-26 LAB — CBC WITH DIFFERENTIAL/PLATELET
Absolute Monocytes: 573 cells/uL (ref 200–950)
Basophils Absolute: 58 cells/uL (ref 0–200)
Basophils Relative: 0.7 %
Eosinophils Absolute: 108 cells/uL (ref 15–500)
Eosinophils Relative: 1.3 %
HCT: 47.2 % — ABNORMAL HIGH (ref 35.0–45.0)
Hemoglobin: 16.5 g/dL — ABNORMAL HIGH (ref 11.7–15.5)
Lymphs Abs: 2822 cells/uL (ref 850–3900)
MCH: 33.3 pg — ABNORMAL HIGH (ref 27.0–33.0)
MCHC: 35 g/dL (ref 32.0–36.0)
MCV: 95.4 fL (ref 80.0–100.0)
MPV: 9.6 fL (ref 7.5–12.5)
Monocytes Relative: 6.9 %
Neutro Abs: 4739 cells/uL (ref 1500–7800)
Neutrophils Relative %: 57.1 %
Platelets: 180 10*3/uL (ref 140–400)
RBC: 4.95 10*6/uL (ref 3.80–5.10)
RDW: 13 % (ref 11.0–15.0)
Total Lymphocyte: 34 %
WBC: 8.3 10*3/uL (ref 3.8–10.8)

## 2021-04-26 LAB — COMPLETE METABOLIC PANEL WITH GFR
AG Ratio: 1.5 (calc) (ref 1.0–2.5)
ALT: 10 U/L (ref 6–29)
AST: 16 U/L (ref 10–35)
Albumin: 4 g/dL (ref 3.6–5.1)
Alkaline phosphatase (APISO): 76 U/L (ref 37–153)
BUN: 13 mg/dL (ref 7–25)
CO2: 26 mmol/L (ref 20–32)
Calcium: 8.7 mg/dL (ref 8.6–10.4)
Chloride: 103 mmol/L (ref 98–110)
Creat: 0.8 mg/dL (ref 0.50–1.03)
Globulin: 2.6 g/dL (calc) (ref 1.9–3.7)
Glucose, Bld: 90 mg/dL (ref 65–99)
Potassium: 3.6 mmol/L (ref 3.5–5.3)
Sodium: 140 mmol/L (ref 135–146)
Total Bilirubin: 0.4 mg/dL (ref 0.2–1.2)
Total Protein: 6.6 g/dL (ref 6.1–8.1)
eGFR: 89 mL/min/{1.73_m2} (ref >=60)

## 2021-04-26 LAB — HIV ANTIBODY (ROUTINE TESTING W REFLEX): HIV 1&2 Ab, 4th Generation: NONREACTIVE

## 2021-04-26 MED ORDER — PREDNISONE 20 MG PO TABS
20.0000 mg | ORAL_TABLET | Freq: Every day | ORAL | 0 refills | Status: AC
Start: 2021-04-26 — End: 2021-05-01

## 2021-04-26 MED ORDER — TIOTROPIUM BROMIDE MONOHYDRATE 18 MCG IN CAPS: 18.0000 ug | ORAL_CAPSULE | Freq: Every day | RESPIRATORY_TRACT | 12 refills | Status: DC

## 2021-04-26 NOTE — Addendum Note (Signed)
Addended by: Teodora Medici on: 04/26/2021 10:53 AM   Modules accepted: Orders

## 2021-06-29 ENCOUNTER — Ambulatory Visit: Payer: Medicaid Other | Admitting: Family Medicine

## 2021-10-24 ENCOUNTER — Ambulatory Visit: Payer: Medicaid Other | Admitting: Internal Medicine

## 2022-05-16 ENCOUNTER — Encounter: Payer: Self-pay | Admitting: Internal Medicine

## 2022-05-30 ENCOUNTER — Ambulatory Visit: Payer: Medicaid Other | Admitting: Internal Medicine

## 2022-06-21 ENCOUNTER — Encounter: Payer: Self-pay | Admitting: Internal Medicine

## 2022-06-21 ENCOUNTER — Other Ambulatory Visit: Payer: Self-pay | Admitting: Family Medicine

## 2022-06-21 ENCOUNTER — Other Ambulatory Visit: Payer: Self-pay | Admitting: Internal Medicine

## 2022-06-21 DIAGNOSIS — G621 Alcoholic polyneuropathy: Secondary | ICD-10-CM

## 2022-06-21 DIAGNOSIS — I10 Essential (primary) hypertension: Secondary | ICD-10-CM

## 2022-06-21 NOTE — Telephone Encounter (Signed)
Requested medications are due for refill today.  yes  Requested medications are on the active medications list.  yes  Last refill. 04/25/2021 #90 1 rf  Future visit scheduled.   With [facility]  Notes to clinic.  PT is requesting enough medication to last until upcoming appt.    Requested Prescriptions  Pending Prescriptions Disp Refills   amLODipine (NORVASC) 10 MG tablet [Pharmacy Med Name: amLODIPine Besylate 10 MG Oral Tablet] 90 tablet 0    Sig: Take 1 tablet by mouth once daily     Cardiovascular: Calcium Channel Blockers 2 Failed - 06/21/2022  2:49 PM      Failed - Valid encounter within last 6 months    Recent Outpatient Visits           1 year ago Primary hypertension   [place], DO   1 year ago No-show for appointment   [facility] Delsa Grana, PA-C   2 years ago Acute nonintractable headache, unspecified headache type   [facility] Delsa Grana, PA-C   3 years ago Laceration of left lower extremity, subsequent encounter   [facility]   3 years ago Visit for suture removal   [place], NP       Future Appointments             In 2 months Cannady, [place] T, NP [place], [facility]            Passed - Last BP in normal range    BP Readings from Last 1 Encounters:  04/25/21 134/70         Passed - Last Heart Rate in normal range    Pulse Readings from Last 1 Encounters:  04/25/21 83

## 2022-06-21 NOTE — Telephone Encounter (Signed)
Requested medications are due for refill today.  yes  Requested medications are on the active medications list.  yes  Last refill. 10/18/2020 #360 1 rf  Future visit scheduled.   Yes - with [facility]  Notes to clinic.  Pt is requesting a refill to last until next ov with [facility]. Labs are expired.    Requested Prescriptions  Pending Prescriptions Disp Refills   gabapentin (NEURONTIN) 300 MG capsule [Pharmacy Med Name: Gabapentin 300 MG Oral Capsule] 360 capsule 0    Sig: TAKE 1 CAPSULE BY MOUTH IN THE MORNING, 1 CAPSULE IN THE AFTERNOON, AND 2 CAPSULES AT BEDTIME     Neurology: Anticonvulsants - gabapentin Failed - 06/21/2022  2:49 PM      Failed - Cr in normal range and within 360 days    Creat  Date Value Ref Range Status  04/25/2021 0.80 0.50 - 1.03 mg/dL Final         Failed - Completed PHQ-2 or PHQ-9 in the last 360 days      Failed - Valid encounter within last 12 months    Recent Outpatient Visits           1 year ago Primary hypertension   [place], DO   1 year ago No-show for appointment   [facility] Delsa Grana, PA-C   2 years ago Acute nonintractable headache, unspecified headache type   [facility] Delsa Grana, PA-C   3 years ago Laceration of left lower extremity, subsequent encounter   [facility]   3 years ago Visit for suture removal   [place], NP       Future Appointments             In 2 months Cannady, Barbaraann Faster, NP [place], [facility]

## 2022-06-22 ENCOUNTER — Other Ambulatory Visit: Payer: Self-pay | Admitting: Internal Medicine

## 2022-06-22 DIAGNOSIS — I10 Essential (primary) hypertension: Secondary | ICD-10-CM

## 2022-06-22 MED ORDER — AMLODIPINE BESYLATE 10 MG PO TABS: 10.0000 mg | ORAL_TABLET | Freq: Every day | ORAL | 1 refills | Status: DC

## 2022-09-15 NOTE — Patient Instructions (Incomplete)
Magnesium 400 MG at night  Please call to schedule your mammogram and/or bone density: [facility] at [facility]  Address: [address], [place], [place] [address] Phone: [phone]  [facility] [address] [place],  [place]  [address] Phone: [phone]    DASH Eating Plan DASH stands for Dietary Approaches to Stop Hypertension. The DASH eating plan is a healthy eating plan that has been shown to: Reduce high blood pressure (hypertension). Reduce your risk for type 2 diabetes, heart disease, and stroke. Help with weight loss. What are tips for following this plan? Reading food labels Check food labels for the amount of salt (sodium) per serving. Choose foods with less than 5 percent of the Daily Value of sodium. Generally, foods with less than 300 milligrams (mg) of sodium per serving fit into this eating plan. To find whole grains, look for the word "whole" as the first word in the ingredient list. Shopping Buy products labeled as "low-sodium" or "no salt added." Buy fresh foods. Avoid canned foods and pre-made or frozen meals. Cooking Avoid adding salt when cooking. Use salt-free seasonings or herbs instead of table salt or sea salt. Check with your health care provider or pharmacist before using salt substitutes. Do not fry foods. Cook foods using healthy methods such as baking, boiling, grilling, roasting, and broiling instead. Cook with heart-healthy oils, such as olive, canola, avocado, soybean, or sunflower oil. Meal planning  Eat a balanced diet that includes: 4 or more servings of fruits and 4 or more servings of vegetables each day. Try to fill one-half of your plate with fruits and vegetables. 6-8 servings of whole grains each day. Less than 6 oz (170 g) of lean meat, poultry, or fish each day. A 3-oz (85-g) serving of meat is about the same size as a deck of cards. One egg equals 1 oz (28 g). 2-3  servings of low-fat dairy each day. One serving is 1 cup (237 mL). 1 serving of nuts, seeds, or beans 5 times each week. 2-3 servings of heart-healthy fats. Healthy fats called omega-3 fatty acids are found in foods such as walnuts, flaxseeds, fortified milks, and eggs. These fats are also found in cold-water fish, such as sardines, salmon, and mackerel. Limit how much you eat of: Canned or prepackaged foods. Food that is high in trans fat, such as some fried foods. Food that is high in saturated fat, such as fatty meat. Desserts and other sweets, sugary drinks, and other foods with added sugar. Full-fat dairy products. Do not salt foods before eating. Do not eat more than 4 egg yolks a week. Try to eat at least 2 vegetarian meals a week. Eat more home-cooked food and less restaurant, buffet, and fast food. Lifestyle When eating at a restaurant, ask that your food be prepared with less salt or no salt, if possible. If you drink alcohol: Limit how much you use to: 0-1 drink a day for women who are not pregnant. 0-2 drinks a day for men. Be aware of how much alcohol is in your drink. In the [place] one drink equals one 12 oz bottle of beer (355 mL), one 5 oz glass of wine (148 mL), or one 1 oz glass of hard liquor (44 mL). General information Avoid eating more than 2,300 mg of salt a day. If you have hypertension, you may need to reduce your sodium intake to 1,500 mg a day. Work with your health  care provider to maintain a healthy body weight or to lose weight. Ask what an ideal weight is for you. Get at least 30 minutes of exercise that causes your heart to beat faster (aerobic exercise) most days of the week. Activities may include walking, swimming, or biking. Work with your health care provider or dietitian to adjust your eating plan to your individual calorie needs. What foods should I eat? Fruits All fresh, dried, or frozen fruit. Canned fruit in natural juice (without added  sugar). Vegetables Fresh or frozen vegetables (raw, steamed, roasted, or grilled). Low-sodium or reduced-sodium tomato and vegetable juice. Low-sodium or reduced-sodium tomato sauce and tomato paste. Low-sodium or reduced-sodium canned vegetables. Grains Whole-grain or whole-wheat bread. Whole-grain or whole-wheat pasta. Brown rice. Orpah Cobb. Bulgur. Whole-grain and low-sodium cereals. Pita bread. Low-fat, low-sodium crackers. Whole-wheat flour tortillas. Meats and other proteins Skinless chicken or [place]. Ground chicken or [place]. Pork with fat trimmed off. Fish and seafood. Egg whites. Dried beans, peas, or lentils. Unsalted nuts, nut butters, and seeds. Unsalted canned beans. Lean cuts of beef with fat trimmed off. Low-sodium, lean precooked or cured meat, such as sausages or meat loaves. Dairy Low-fat (1%) or fat-free (skim) milk. Reduced-fat, low-fat, or fat-free cheeses. Nonfat, low-sodium ricotta or cottage cheese. Low-fat or nonfat yogurt. Low-fat, low-sodium cheese. Fats and oils Soft margarine without trans fats. Vegetable oil. Reduced-fat, low-fat, or light mayonnaise and salad dressings (reduced-sodium). Canola, safflower, olive, avocado, soybean, and sunflower oils. Avocado. Seasonings and condiments Herbs. Spices. Seasoning mixes without salt. Other foods Unsalted popcorn and pretzels. Fat-free sweets. The items listed above may not be a complete list of foods and beverages you can eat. Contact a dietitian for more information. What foods should I avoid? Fruits Canned fruit in a light or heavy syrup. Fried fruit. Fruit in cream or butter sauce. Vegetables Creamed or fried vegetables. Vegetables in a cheese sauce. Regular canned vegetables (not low-sodium or reduced-sodium). Regular canned tomato sauce and paste (not low-sodium or reduced-sodium). Regular tomato and vegetable juice (not low-sodium or reduced-sodium). Rosita Fire. Olives. Grains Baked goods made with fat, such  as croissants, muffins, or some breads. Dry pasta or rice meal packs. Meats and other proteins Fatty cuts of meat. Ribs. Fried meat. Tomasa Blase. Bologna, salami, and other precooked or cured meats, such as sausages or meat loaves. Fat from the back of a pig (fatback). Bratwurst. Salted nuts and seeds. Canned beans with added salt. Canned or smoked fish. Whole eggs or egg yolks. Chicken or [place] with skin. Dairy Whole or 2% milk, cream, and half-and-half. Whole or full-fat cream cheese. Whole-fat or sweetened yogurt. Full-fat cheese. Nondairy creamers. Whipped toppings. Processed cheese and cheese spreads. Fats and oils Butter. Stick margarine. Lard. Shortening. Ghee. Bacon fat. Tropical oils, such as coconut, palm kernel, or palm oil. Seasonings and condiments Onion salt, garlic salt, seasoned salt, table salt, and sea salt. Worcestershire sauce. Tartar sauce. Barbecue sauce. Teriyaki sauce. Soy sauce, including reduced-sodium. Steak sauce. Canned and packaged gravies. Fish sauce. Oyster sauce. Cocktail sauce. Store-bought horseradish. Ketchup. Mustard. Meat flavorings and tenderizers. Bouillon cubes. Hot sauces. Pre-made or packaged marinades. Pre-made or packaged taco seasonings. Relishes. Regular salad dressings. Other foods Salted popcorn and pretzels. The items listed above may not be a complete list of foods and beverages you should avoid. Contact a dietitian for more information. Where to find more information National Heart, Lung, and Blood Institute: PopSteam.is American Heart Association: www.heart.org Academy of Nutrition and Dietetics: www.eatright.org National Kidney Foundation: www.kidney.org Summary The DASH  eating plan is a healthy eating plan that has been shown to reduce high blood pressure (hypertension). It may also reduce your risk for type 2 diabetes, heart disease, and stroke. When on the DASH eating plan, aim to eat more fresh fruits and vegetables, whole grains, lean  proteins, low-fat dairy, and heart-healthy fats. With the DASH eating plan, you should limit salt (sodium) intake to 2,300 mg a day. If you have hypertension, you may need to reduce your sodium intake to 1,500 mg a day. Work with your health care provider or dietitian to adjust your eating plan to your individual calorie needs. This information is not intended to replace advice given to you by your health care provider. Make sure you discuss any questions you have with your health care provider. Document Revised: 03/20/2019 Document Reviewed: 03/20/2019 Elsevier Patient Education  2023 [employer].

## 2022-09-18 ENCOUNTER — Encounter: Payer: Self-pay | Admitting: Nurse Practitioner

## 2022-09-18 ENCOUNTER — Ambulatory Visit: Payer: Medicaid Other | Admitting: Nurse Practitioner

## 2022-09-18 VITALS — BP 138/88 | HR 91 | Temp 98.0°F | Ht 64.02 in | Wt 150.8 lb

## 2022-09-18 DIAGNOSIS — G621 Alcoholic polyneuropathy: Secondary | ICD-10-CM

## 2022-09-18 DIAGNOSIS — E559 Vitamin D deficiency, unspecified: Secondary | ICD-10-CM | POA: Diagnosis not present

## 2022-09-18 DIAGNOSIS — K7010 Alcoholic hepatitis without ascites: Secondary | ICD-10-CM

## 2022-09-18 DIAGNOSIS — I10 Essential (primary) hypertension: Secondary | ICD-10-CM | POA: Diagnosis not present

## 2022-09-18 DIAGNOSIS — F10982 Alcohol use, unspecified with alcohol-induced sleep disorder: Secondary | ICD-10-CM | POA: Diagnosis not present

## 2022-09-18 DIAGNOSIS — E538 Deficiency of other specified B group vitamins: Secondary | ICD-10-CM

## 2022-09-18 DIAGNOSIS — D582 Other hemoglobinopathies: Secondary | ICD-10-CM

## 2022-09-18 DIAGNOSIS — Z13220 Encounter for screening for lipoid disorders: Secondary | ICD-10-CM

## 2022-09-18 DIAGNOSIS — Z1231 Encounter for screening mammogram for malignant neoplasm of breast: Secondary | ICD-10-CM

## 2022-09-18 DIAGNOSIS — E278 Other specified disorders of adrenal gland: Secondary | ICD-10-CM | POA: Diagnosis not present

## 2022-09-18 DIAGNOSIS — R748 Abnormal levels of other serum enzymes: Secondary | ICD-10-CM

## 2022-09-18 DIAGNOSIS — F17220 Nicotine dependence, chewing tobacco, uncomplicated: Secondary | ICD-10-CM

## 2022-09-18 DIAGNOSIS — F1010 Alcohol abuse, uncomplicated: Secondary | ICD-10-CM

## 2022-09-18 MED ORDER — TRAZODONE HCL 50 MG PO TABS: ORAL_TABLET | ORAL | 4 refills | Status: DC

## 2022-09-18 MED ORDER — OLMESARTAN MEDOXOMIL 5 MG PO TABS: 5.0000 mg | ORAL_TABLET | Freq: Every day | ORAL | 3 refills | Status: DC

## 2022-09-18 MED ORDER — GABAPENTIN 300 MG PO CAPS: ORAL_CAPSULE | ORAL | 4 refills | Status: DC

## 2022-09-18 MED ORDER — AMLODIPINE BESYLATE 10 MG PO TABS: 10.0000 mg | ORAL_TABLET | Freq: Every day | ORAL | 4 refills | Status: DC

## 2022-09-18 NOTE — Assessment & Plan Note (Addendum)
Chronic, stable.  Has not had alcohol in 2 weeks, recommend continued cessation.  Check labs today.  Initiate supplement as needed.

## 2022-09-18 NOTE — Assessment & Plan Note (Signed)
Noted on imaging 2019 and 2020 -- no changes in size.  Continue to monitor and consider renal ultrasound to further assess areas.  CMP on labs today.

## 2022-09-18 NOTE — Assessment & Plan Note (Signed)
Chronic, ongoing.  BP above goal in office today and on home readings.  Will continue Amlodipine 10 MG daily and add on Olmesartan starting at 5 MG daily.  Educated her on this medication - use and side effects to monitor for.  Discussed kidney protective element of ARB family.  Would avoid ACE due to her underlying COPD.  Recommend she monitor BP at least a few mornings a week at home and document.  DASH diet at home.  Continue current medication regimen and adjust as needed.  Labs today: CMP, CBC, TSH.  Obtain urine ALB in future.  Return in 4 weeks.

## 2022-09-18 NOTE — Assessment & Plan Note (Signed)
Chronic, ongoing.  Continue Trazodone as ordered.  Refills sent.  Recommend complete cessation of alcohol use.

## 2022-09-18 NOTE — Assessment & Plan Note (Addendum)
Chronic, stable with Gabapentin on board.  Will continue this.  Refills sent in.  Recommend she continue cessation of alcohol use for overall health and wellness.  She has not had any alcohol in 2 weeks per her report.  Check labs: B12, Mag, Ferritin, Iron, CBC, CMP, TSH, Vit D.  Adjust regimen as needed based on findings.

## 2022-09-18 NOTE — Assessment & Plan Note (Signed)
Noted past labs -- suspect related to alcohol use.  Recommend she start Magnesium 400 MG every night and check level today.

## 2022-09-18 NOTE — Assessment & Plan Note (Signed)
Chronic, ongoing.  Recommend taking Vitamin D3 2000 units daily.  Check level today and adjust plan as needed.

## 2022-09-18 NOTE — Progress Notes (Signed)
New Patient Office Visit  Subjective    Patient ID: Beth [name], female    DOB: [birth year]  Age: 52 y.o. MRN: [id number]  CC:  Chief Complaint  Patient presents with   New Patient (Initial Visit)    HPI Beth [name] presents for new patient visit to establish care.  Introduced to Publishing rights manager role and practice setting.  All questions answered.  Discussed provider/patient relationship and expectations. Was at [facility] prior to this and her PCP left - so she is looking for change.  Previously saw Dr. Sherie Don.    HYPERTENSION / HYPERLIPIDEMIA Currently taking Amlodipine for HTN -- has not taken any other medications for this.  Has not taken medication for HLD.  Has no drank alcohol in 2 weeks, prior to this drank every day.  Would drink a pint of liquor a day over past 10 years.  No family history of alcohol abuse.  Does have supportive family who is assisting her with cut back.  Has never attended AA or rehab.    Is a current 1 PPD smoker.  Has smoked for a good 30 years.  Mother passed from lung cancer -- was a smoker, at age 110. Satisfied with current treatment? yes Duration of hypertension: chronic BP monitoring frequency: weekly BP range: 146/88 BP medication side effects: no Past BP meds: none Duration of hyperlipidemia: chronic Cholesterol supplements: none Past cholesterol medications: none Aspirin: no Recent stressors: no Recurrent headaches: no Visual changes: no Palpitations: no Dyspnea: no Chest pain: no Lower extremity edema: no Dizzy/lightheaded: no  The 10-year ASCVD risk score (Arnett DK, et al., 2019) is: 3.2%   Values used to calculate the score:     Age: 45 years     Sex: Female     Is Non-Hispanic African American: No     Diabetic: No     Tobacco smoker: Yes     Systolic Blood Pressure: 138 mmHg     Is BP treated: Yes     HDL Cholesterol: 88 mg/dL     Total Cholesterol: 163 mg/dL  [employer] Visit from 09/18/2022 in [facility]  AUDIT-C Score 6       Flowsheet Row Office Visit from 09/18/2022 in [facility]  Alcohol Use Disorder Identification Test Final Score (AUDIT) 11       COPD Current smoker, has used inhalers in past.  Took Symbicort in past and Albuterol. COPD status: stable Satisfied with current treatment?: yes Oxygen use: no Dyspnea frequency: no Cough frequency: no Rescue inhaler frequency:  once a month or so Limitation of activity: no Productive cough: no Last Spirometry: unknown Pneumovax: Up to Date Influenza: Up to Date   INSOMNIA Takes Trazodone at bedtime.   Duration: chronic Satisfied with sleep quality: yes Difficulty falling asleep: yes Difficulty staying asleep: yes Waking a few hours after sleep onset: no Early morning awakenings: yes Daytime hypersomnolence: no Wakes feeling refreshed: yes Good sleep hygiene: yes Apnea: no Snoring: no Depressed/anxious mood: no Recent stress: no Restless legs/nocturnal leg cramps: no Chronic pain/arthritis: yes History of sleep study: no Treatments attempted: melatonin, uinsom, and benadryl, Amitriptyline    NEUROPATHY Present for 8 years.  Mainly to feet -- currently taking Gabapentin which offers benefit.  Was seen by neurosurgery in 2019. Used to take Vitamin D, but none recently. Neuropathy status: controlled  Satisfied with current treatment?: yes Medication side effects: no Medication compliance:  good compliance Location: feet bilateral  Pain: yes Severity: 2/10  Quality:  aching, tingling, and pins and needles Frequency: intermittent Bilateral: yes Symmetric: yes Numbness: yes Decreased sensation: yes Weakness:  sometimes Context: stable Alleviating factors: Gabapentin Aggravating factors: alcohol use Treatments attempted: Gabapentin    09/18/2022    9:32 AM 04/25/2021    1:27 PM 07/01/2019   11:35 AM 12/17/2018    2:27 PM 03/03/2018   11:18 AM  Depression screen  PHQ 2/9  Decreased Interest 0 1 0 0 0  Down, Depressed, Hopeless 0 0 0 0 0  PHQ - 2 Score 0 1 0 0 0  Altered sleeping 1 0 3 0 0  Tired, decreased energy 1 0 3 0 0  Change in appetite 0 0 1 0 0  Feeling bad or failure about yourself  0 0 0 0 0  Trouble concentrating 0 0 0 0 0  Moving slowly or fidgety/restless 0 0 0 0 0  Suicidal thoughts 0 0 0 0 0  PHQ-9 Score 2 1 7  0 0  Difficult doing work/chores Not difficult at all Not difficult at all Not difficult at all Not difficult at all Not difficult at all       09/18/2022    9:32 AM  GAD 7 : Generalized Anxiety Score  Nervous, Anxious, on Edge 1  Control/stop worrying 0  Worry too much - different things 0  Trouble relaxing 0  Restless 0  Easily annoyed or irritable 0  Afraid - awful might happen 0  Total GAD 7 Score 1  Anxiety Difficulty Not difficult at all   Outpatient Encounter Medications as of 09/18/2022  Medication Sig   albuterol (VENTOLIN HFA) 108 (90 Base) MCG/ACT inhaler Inhale 2 puffs into the lungs every 4 (four) hours as needed for wheezing or shortness of breath.   olmesartan (BENICAR) 5 MG tablet Take 1 tablet (5 mg total) by mouth daily.   omeprazole (PRILOSEC) 10 MG capsule Take 1 capsule (10 mg total) by mouth daily.   [DISCONTINUED] amLODipine (NORVASC) 10 MG tablet Take 1 tablet (10 mg total) by mouth daily.   [DISCONTINUED] gabapentin (NEURONTIN) 300 MG capsule TAKE 1 CAPSULE BY MOUTH IN THE MORNING, 1 CAPSULE IN THE AFTERNOON, AND 2 CAPSULES AT BEDTIME   [DISCONTINUED] traZODone (DESYREL) 50 MG tablet TAKE 1/2 TO 1 (ONE-HALF TO ONE) TABLET BY MOUTH AT BEDTIME AS NEEDED FOR SLEEP   amLODipine (NORVASC) 10 MG tablet Take 1 tablet (10 mg total) by mouth daily.   gabapentin (NEURONTIN) 300 MG capsule TAKE 1 CAPSULE BY MOUTH IN THE MORNING, 1 CAPSULE IN THE AFTERNOON, AND 2 CAPSULES AT BEDTIME   traZODone (DESYREL) 50 MG tablet TAKE 1/2 TO 1 (ONE-HALF TO ONE) TABLET BY MOUTH AT BEDTIME AS NEEDED FOR SLEEP    [DISCONTINUED] guaiFENesin (MUCINEX) 600 MG 12 hr tablet Take 1 tablet (600 mg total) by mouth 2 (two) times daily.   [DISCONTINUED] tiotropium (SPIRIVA) 18 MCG inhalation capsule Place 1 capsule (18 mcg total) into inhaler and inhale daily.   No facility-administered encounter medications on file as of 09/18/2022.    Past Medical History:  Diagnosis Date   Abnormal gamma-glutamyl transferase test    Adjustment disorder with mixed anxiety and depressed mood    Adrenal nodule (HCC) 10/01/2017   Due for repeat CT scan June 2020   Alcohol abuse    Alcoholic peripheral neuropathy (HCC)    Anemia    Arthritis    Asthma    Cardiac murmur    Chronic  alcoholic hepatitis    Chronic alcoholism (HCC)    Chronic alcoholism in remission (HCC)    Chronic back pain    COPD (chronic obstructive pulmonary disease) (HCC)    Elevated liver enzymes Jan 2014   referred to and seen by GI   Fatty liver    GERD (gastroesophageal reflux disease)    Hiatal hernia    Hypertension    Macrocytosis    alcoholic   Ruptured tubal pregnancy    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   Thoracic spinal stenosis 10/01/2017   Tobacco abuse    Vitamin D deficiency disease     Past Surgical History:  Procedure Laterality Date   FINGER SURGERY     LAPAROSCOPIC OOPHERECTOMY Right    TUBAL LIGATION  1992    Family History  Problem Relation Age of Onset   Cancer Mother        lung   Kidney failure Father    Diabetes Father    Hypertension Father    Heart disease Father    Heart attack Father    Hypertension Sister    Cancer Brother        lung, liver   Diabetes Brother    Hypertension Brother    Cancer Brother        lung, brain   Diabetes Maternal Grandmother    Hypertension Maternal Grandmother    Hypertension Maternal Grandfather    Hypertension Paternal Grandmother    Hypertension Paternal Grandfather     Social History   Socioeconomic History   Marital status: Widowed    Spouse name: Not on  file   Number of children: 0   Years of education: 12th   Highest education level: Not on file  Occupational History    Employer: OTHER    Comment: n/a  Tobacco Use   Smoking status: Every Day    Packs/day: 1.00    Years: 25.00    Additional pack years: 0.00    Total pack years: 25.00    Types: Cigarettes   Smokeless tobacco: Never  Vaping Use   Vaping Use: Never used  Substance and Sexual Activity   Alcohol use: Yes    Comment: very seldom   Drug use: No   Sexual activity: Yes  Other Topics Concern   Not on file  Social History Narrative   Patient lives at home with her family.   Caffeine Use: daily   Social Determinants of Health   Financial Resource Strain: Low Risk  (09/18/2022)   Overall Financial Resource Strain (CARDIA)    Difficulty of Paying Living Expenses: Not hard at all  Food Insecurity: No Food Insecurity (09/18/2022)   Hunger Vital Sign    Worried About Running Out of Food in the Last Year: Never true    Ran Out of Food in the Last Year: Never true  Transportation Needs: No Transportation Needs (09/18/2022)   PRAPARE - Administrator, Civil Service (Medical): No    Lack of Transportation (Non-Medical): No  Physical Activity: Insufficiently Active (09/18/2022)   Exercise Vital Sign    Days of Exercise per Week: 2 days    Minutes of Exercise per Session: 20 min  Stress: No Stress Concern Present (09/18/2022)   [employer] of Occupational Health - Occupational Stress Questionnaire    Feeling of Stress : Not at all  Social Connections: Socially Isolated (09/18/2022)   Social Connection and Isolation Panel [NHANES]    Frequency of Communication with Friends and  Family: Three times a week    Frequency of Social Gatherings with Friends and Family: Three times a week    Attends Religious Services: Never    Active Member of Clubs or Organizations: No    Attends Banker Meetings: Never    Marital Status: Widowed  Intimate Partner  Violence: Not At Risk (09/18/2022)   Humiliation, Afraid, Rape, and Kick questionnaire    Fear of Current or Ex-Partner: No    Emotionally Abused: No    Physically Abused: No    Sexually Abused: No    Review of Systems  Constitutional:  Negative for chills, diaphoresis, fever and weight loss.  Respiratory:  Negative for cough, shortness of breath and wheezing.   Cardiovascular:  Negative for chest pain, palpitations, orthopnea and leg swelling.  Gastrointestinal: Negative.   Neurological:  Positive for tingling (feet). Negative for dizziness, tremors, seizures and weakness.  Endo/Heme/Allergies: Negative.   Psychiatric/Behavioral: Negative.        Objective    BP 138/88 (BP Location: Left Arm, Patient Position: Sitting, Cuff Size: Normal)   Pulse 91   Temp 98 F (36.7 C) (Oral)   Ht 5' 4.02" (1.626 m)   Wt 150 lb 12.8 oz (68.4 kg)   SpO2 98%   BMI 25.87 kg/m   Physical Exam Vitals and nursing note reviewed.  Constitutional:      General: She is awake. She is not in acute distress.    Appearance: She is well-developed and well-groomed. She is not ill-appearing or toxic-appearing.  HENT:     Head: Normocephalic.     Right Ear: Hearing and external ear normal.     Left Ear: Hearing and external ear normal.  Eyes:     General: Lids are normal.        Right eye: No discharge.        Left eye: No discharge.     Conjunctiva/sclera: Conjunctivae normal.     Pupils: Pupils are equal, round, and reactive to light.  Neck:     Thyroid: No thyromegaly.     Vascular: No carotid bruit.  Cardiovascular:     Rate and Rhythm: Normal rate and regular rhythm.     Heart sounds: Normal heart sounds. No murmur heard.    No gallop.  Pulmonary:     Effort: Pulmonary effort is normal. No accessory muscle usage or respiratory distress.     Breath sounds: Normal breath sounds.  Abdominal:     General: Bowel sounds are normal. There is no distension.     Palpations: Abdomen is soft.      Tenderness: There is no abdominal tenderness.  Musculoskeletal:     Cervical back: Normal range of motion and neck supple.     Right lower leg: No edema.     Left lower leg: No edema.  Lymphadenopathy:     Cervical: No cervical adenopathy.  Skin:    General: Skin is warm and dry.  Neurological:     Mental Status: She is alert and oriented to person, place, and time.     Cranial Nerves: Cranial nerves 2-12 are intact.     Gait: Gait is intact.     Deep Tendon Reflexes: Reflexes are normal and symmetric.     Reflex Scores:      Brachioradialis reflexes are 2+ on the right side and 2+ on the left side.      Patellar reflexes are 2+ on the right side and 2+ on the left  side. Psychiatric:        Attention and Perception: Attention normal.        Mood and Affect: Mood normal.        Speech: Speech normal.        Behavior: Behavior normal. Behavior is cooperative.        Thought Content: Thought content normal.    Last CBC Lab Results  Component Value Date   WBC 8.3 04/25/2021   HGB 16.5 (H) 04/25/2021   HCT 47.2 (H) 04/25/2021   MCV 95.4 04/25/2021   MCH 33.3 (H) 04/25/2021   RDW 13.0 04/25/2021   PLT 180 04/25/2021   Last metabolic panel Lab Results  Component Value Date   GLUCOSE 90 04/25/2021   NA 140 04/25/2021   K 3.6 04/25/2021   CL 103 04/25/2021   CO2 26 04/25/2021   BUN 13 04/25/2021   CREATININE 0.80 04/25/2021   EGFR 89 04/25/2021   CALCIUM 8.7 04/25/2021   PHOS 4.3 12/15/2014   PROT 6.6 04/25/2021   ALBUMIN 4.1 04/28/2015   LABGLOB 2.5 04/28/2015   AGRATIO 1.6 04/28/2015   BILITOT 0.4 04/25/2021   ALKPHOS 263 (H) 04/28/2015   AST 16 04/25/2021   ALT 10 04/25/2021   ANIONGAP 13 12/16/2014   Last lipids Lab Results  Component Value Date   CHOL 163 04/25/2021   HDL 88 04/25/2021   LDLCALC 62 04/25/2021   TRIG 58 04/25/2021   CHOLHDL 1.9 04/25/2021   Last hemoglobin A1c Lab Results  Component Value Date   HGBA1C 5.1 09/20/2017   Last thyroid  functions Lab Results  Component Value Date   TSH 0.72 09/20/2017   Last vitamin D Lab Results  Component Value Date   VD25OH 25 (L) 09/20/2017      Assessment & Plan:   Problem List Items Addressed This Visit       Cardiovascular and Mediastinum   Hypertension (Chronic)    Chronic, ongoing.  BP above goal in office today and on home readings.  Will continue Amlodipine 10 MG daily and add on Olmesartan starting at 5 MG daily.  Educated her on this medication - use and side effects to monitor for.  Discussed kidney protective element of ARB family.  Would avoid ACE due to her underlying COPD.  Recommend she monitor BP at least a few mornings a week at home and document.  DASH diet at home.  Continue current medication regimen and adjust as needed.  Labs today: CMP, CBC, TSH.  Obtain urine ALB in future.  Return in 4 weeks.       Relevant Medications   amLODipine (NORVASC) 10 MG tablet   olmesartan (BENICAR) 5 MG tablet   Other Relevant Orders   CBC with Differential/Platelet   Comprehensive metabolic panel     Digestive   Chronic alcoholic hepatitis    Chronic, stable.  At this time has not had alcohol in 2 weeks, recommend continued cessation.  Check CMP today and may benefit liver ultrasound in future.  Consider GI referral if any worsening.      Relevant Orders   Comprehensive metabolic panel     Nervous and Auditory   Alcoholic peripheral neuropathy (HCC) - Primary    Chronic, stable with Gabapentin on board.  Will continue this.  Refills sent in.  Recommend she continue cessation of alcohol use for overall health and wellness.  She has not had any alcohol in 2 weeks per her report.  Check labs: B12, Mag,  Ferritin, Iron, CBC, CMP, TSH, Vit D.  Adjust regimen as needed based on findings.      Relevant Medications   gabapentin (NEURONTIN) 300 MG capsule   traZODone (DESYREL) 50 MG tablet   Other Relevant Orders   Vitamin B12   TSH   Magnesium   Iron Binding Cap  (TIBC)([facility])   Ferritin     Other   Adrenal nodule (HCC)    Noted on imaging 2019 and 2020 -- no changes in size.  Continue to monitor and consider renal ultrasound to further assess areas.  CMP on labs today.        Relevant Orders   Comprehensive metabolic panel   Alcohol abuse    Ongoing, last use of alcohol 2 weeks ago.  Recommend continued cessation for overall health.  Reports supportive family.  Refuses AA or rehab.  Monitor closely.  Labs obtained today.      Elevated hemoglobin (HCC)    Noted on past labs, is a smoker.  Recheck CBC today and monitor closely.      Relevant Orders   CBC with Differential/Platelet   Elevated liver enzymes    Ongoing, has not had alcohol in 2 weeks.  Recommend continued cessation of use.  Check labs today and consider imaging if worsening or ongoing.      Relevant Orders   Comprehensive metabolic panel   Folic acid deficiency    Chronic, stable.  Has not had alcohol in 2 weeks, recommend continued cessation.  Check labs today.  Initiate supplement as needed.      Relevant Orders   Folate   Hypomagnesemia    Noted past labs -- suspect related to alcohol use.  Recommend she start Magnesium 400 MG every night and check level today.      Insomnia    Chronic, ongoing.  Continue Trazodone as ordered.  Refills sent.  Recommend complete cessation of alcohol use.        Relevant Medications   traZODone (DESYREL) 50 MG tablet   Nicotine dependence, chewing tobacco, uncomplicated    I have recommended complete cessation of tobacco use. I have discussed various options available for assistance with tobacco cessation including over the counter methods (Nicotine gum, patch and lozenges). We also discussed prescription options (Chantix, Nicotine Inhaler / Nasal Spray). The patient is not interested in pursuing any prescription tobacco cessation options at this time.  Referral for lung cancer screening placed after discussion with  patient.       Relevant Orders   Ambulatory Referral Lung Cancer Screening [facility]   Vitamin D deficiency disease    Chronic, ongoing.  Recommend taking Vitamin D3 2000 units daily.  Check level today and adjust plan as needed.      Relevant Orders   VITAMIN D 25 Hydroxy (Vit-D Deficiency, Fractures)   Other Visit Diagnoses     Encounter for lipid screening for cardiovascular disease       Lipid panel on labs today.   Relevant Orders   Lipid Panel w/o Chol/HDL Ratio   Encounter for screening mammogram for malignant neoplasm of breast       Mammogram ordered and instructed patient on where to obtain and how to schedule.   Relevant Orders   MM 3D SCREENING MAMMOGRAM BILATERAL BREAST       Return in about 4 weeks (around 10/16/2022) for HTN -- added on Olmesartan and perform pap.   Marjie Skiff, NP

## 2022-09-18 NOTE — Assessment & Plan Note (Signed)
Chronic, stable.  At this time has not had alcohol in 2 weeks, recommend continued cessation.  Check CMP today and may benefit liver ultrasound in future.  Consider GI referral if any worsening.

## 2022-09-18 NOTE — Assessment & Plan Note (Signed)
Ongoing, last use of alcohol 2 weeks ago.  Recommend continued cessation for overall health.  Reports supportive family.  Refuses AA or rehab.  Monitor closely.  Labs obtained today.

## 2022-09-18 NOTE — Assessment & Plan Note (Signed)
Noted on past labs, is a smoker.  Recheck CBC today and monitor closely.

## 2022-09-18 NOTE — Assessment & Plan Note (Signed)
Ongoing, has not had alcohol in 2 weeks.  Recommend continued cessation of use.  Check labs today and consider imaging if worsening or ongoing.

## 2022-09-18 NOTE — Assessment & Plan Note (Signed)
I have recommended complete cessation of tobacco use. I have discussed various options available for assistance with tobacco cessation including over the counter methods (Nicotine gum, patch and lozenges). We also discussed prescription options (Chantix, Nicotine Inhaler / Nasal Spray). The patient is not interested in pursuing any prescription tobacco cessation options at this time.  Referral for lung cancer screening placed after discussion with patient.

## 2022-09-19 ENCOUNTER — Other Ambulatory Visit: Payer: Self-pay | Admitting: Nurse Practitioner

## 2022-09-19 LAB — COMPREHENSIVE METABOLIC PANEL
ALT: 14 IU/L (ref 0–32)
AST: 19 IU/L (ref 0–40)
Albumin/Globulin Ratio: 1.9 (ref 1.2–2.2)
Albumin: 4.5 g/dL (ref 3.8–4.9)
Alkaline Phosphatase: 93 IU/L (ref 44–121)
BUN/Creatinine Ratio: 15 (ref 9–23)
BUN: 12 mg/dL (ref 6–24)
Bilirubin Total: <0.2 mg/dL (ref 0.0–1.2)
CO2: 21 mmol/L (ref 20–29)
Calcium: 9.2 mg/dL (ref 8.7–10.2)
Chloride: 101 mmol/L (ref 96–106)
Creatinine, Ser: 0.78 mg/dL (ref 0.57–1.00)
Globulin, Total: 2.4 g/dL (ref 1.5–4.5)
Glucose: 85 mg/dL (ref 70–99)
Potassium: 4.1 mmol/L (ref 3.5–5.2)
Sodium: 139 mmol/L (ref 134–144)
Total Protein: 6.9 g/dL (ref 6.0–8.5)
eGFR: 91 mL/min/{1.73_m2} (ref >59)

## 2022-09-19 LAB — IRON AND TIBC
Iron Saturation: 24 % (ref 15–55)
Iron: 84 ug/dL (ref 27–159)
Total Iron Binding Capacity: 346 ug/dL (ref 250–450)
UIBC: 262 ug/dL (ref 131–425)

## 2022-09-19 LAB — CBC WITH DIFFERENTIAL/PLATELET
Basophils Absolute: 0.1 10*3/uL (ref 0.0–0.2)
Basos: 1 %
EOS (ABSOLUTE): 0.1 10*3/uL (ref 0.0–0.4)
Eos: 1 %
Hematocrit: 50 % — ABNORMAL HIGH (ref 34.0–46.6)
Hemoglobin: 16.2 g/dL — ABNORMAL HIGH (ref 11.1–15.9)
Immature Grans (Abs): 0 10*3/uL (ref 0.0–0.1)
Immature Granulocytes: 0 %
Lymphocytes Absolute: 2.2 10*3/uL (ref 0.7–3.1)
Lymphs: 24 %
MCH: 30.3 pg (ref 26.6–33.0)
MCHC: 32.4 g/dL (ref 31.5–35.7)
MCV: 94 fL (ref 79–97)
Monocytes Absolute: 0.5 10*3/uL (ref 0.1–0.9)
Monocytes: 6 %
Neutrophils Absolute: 6.4 10*3/uL (ref 1.4–7.0)
Neutrophils: 68 %
Platelets: 234 10*3/uL (ref 150–450)
RBC: 5.34 x10E6/uL — ABNORMAL HIGH (ref 3.77–5.28)
RDW: 13.8 % (ref 11.7–15.4)
WBC: 9.3 10*3/uL (ref 3.4–10.8)

## 2022-09-19 LAB — TSH: TSH: 0.541 u[IU]/mL (ref 0.450–4.500)

## 2022-09-19 LAB — LIPID PANEL W/O CHOL/HDL RATIO
Cholesterol, Total: 188 mg/dL (ref 100–199)
HDL: 65 mg/dL (ref >39)
LDL Chol Calc (NIH): 110 mg/dL — ABNORMAL HIGH (ref 0–99)
Triglycerides: 68 mg/dL (ref 0–149)
VLDL Cholesterol Cal: 13 mg/dL (ref 5–40)

## 2022-09-19 LAB — VITAMIN D 25 HYDROXY (VIT D DEFICIENCY, FRACTURES): Vit D, 25-Hydroxy: 22 ng/mL — ABNORMAL LOW (ref 30.0–100.0)

## 2022-09-19 LAB — MAGNESIUM: Magnesium: 2.2 mg/dL (ref 1.6–2.3)

## 2022-09-19 LAB — FOLATE: Folate: 5.2 ng/mL (ref >3.0)

## 2022-09-19 LAB — VITAMIN B12: Vitamin B-12: 522 pg/mL (ref 232–1245)

## 2022-09-19 LAB — FERRITIN: Ferritin: 116 ng/mL (ref 15–150)

## 2022-09-19 MED ORDER — VITAMIN D3 50 MCG (2000 UT) PO CAPS: 2000.0000 [IU] | ORAL_CAPSULE | Freq: Every day | ORAL | 4 refills | Status: DC

## 2022-09-19 NOTE — Progress Notes (Signed)
Contacted via MyChart The 10-year ASCVD risk score (Arnett DK, et al., 2019) is: 5.2%   Values used to calculate the score:     Age: 52 years     Sex: Female     Is Non-Hispanic African American: No     Diabetic: No     Tobacco smoker: Yes     Systolic Blood Pressure: 138 mmHg     Is BP treated: Yes     HDL Cholesterol: 65 mg/dL     Total Cholesterol: 188 mg/dL   I forgot to mention cholesterol.  Your LDL is above normal. The LDL is the bad cholesterol. Over time and in combination with inflammation and other factors, this contributes to plaque which in turn may lead to stroke and/or heart attack down the road. Sometimes high LDL is primarily genetic, and people might be eating all the right foods but still have high numbers. Other times, there is room for improvement in one's diet and eating healthier can bring this number down and potentially reduce one's risk of heart attack and/or stroke.   To reduce your LDL, Remember - more fruits and vegetables, more fish, and limit red meat and dairy products. More soy, nuts, beans, barley, lentils, oats and plant sterol ester enriched margarine instead of butter. I also encourage eliminating sugar and processed food. Remember, shop on the outside of the grocery store and visit your [employer]. If you would like to talk with me about dietary changes for your cholesterol, please let me know. We should recheck your cholesterol in 6 months.

## 2022-09-19 NOTE — Progress Notes (Signed)
Contacted via MyChart   Good afternoon Beth [name], your labs have returned -- also it was wonderful to meet you and I look forward to being a team with you: - Vitamin D level is on low side, I will send in supplement for you to take daily. - CBC shows mild elevation in hemoglobin and and hematocrit, which is similar to previous checks and suspect related to smoking, definitely recommend cut back. - B12 level is normal, as is magnesium. - TSH, thyroid lab, is normal. - Kidney function, creatinine and eGFR, remains normal, as is liver function, AST and ALT.  - Remainder of labs are all normal.  Great job!!  Any questions? Keep being amazing!!  Thank you for allowing me to participate in your care.  I appreciate you. Kindest regards, Clair Alfieri

## 2022-10-14 NOTE — Patient Instructions (Signed)
Please call to schedule your mammogram and/or bone density: [facility] at [facility]  Address: [address], [place], [place] [address] Phone: [phone]  [facility] [address] [place],  [place]  [address] Phone: [phone]    Pap Test Why am I having this test? A Pap test, also called a Pap smear, is a screening test to check for signs of: Infection. Cancer of the cervix. The cervix is the lower part of the uterus that opens into the vagina. Changes that may be a sign that cancer is developing (precancerous changes). Women need this test on a regular basis. In general, you should have a Pap test every 3 years until you reach menopause or age 70. Women aged 30-60 may choose to have their Pap test done at the same time as an HPV (human papillomavirus) test every 5 years (instead of every 3 years). Your health care provider may recommend having Pap tests more or less often depending on your medical conditions and past Pap test results. What is being tested? Cervical cells are tested for signs of infection or abnormalities. What kind of sample is taken?  Your health care provider will collect a sample of cells from the surface of your cervix. This will be done using a small cotton swab, plastic spatula, or brush that is inserted into your vagina using a tool called a speculum. This sample is often collected during a pelvic exam, when you are lying on your back on an exam table with your feet in footrests (stirrups). In some cases, fluids (secretions) from the cervix or vagina may also be collected. How do I prepare for this test? Be aware of where you are in your menstrual cycle. If you are menstruating on the day of the test, you may be asked to reschedule. You may need to reschedule if you have a known vaginal infection on the day of the test. Follow instructions from your health care provider about: Changing or  stopping your regular medicines. Some medicines can cause abnormal test results, such as vaginal medicines and tetracycline. Avoiding douching 2-3 days before or the day of the test. Tell a health care provider about: Any allergies you have. All medicines you are taking, including vitamins, herbs, eye drops, creams, and over-the-counter medicines. Any bleeding problems you have. Any surgeries you have had. Any medical conditions you have. Whether you are pregnant or may be pregnant. How are the results reported? Your test results will be reported as either abnormal or normal. What do the results mean? A normal test result means that you do not have signs of cancer of the cervix. An abnormal result may mean that you have: Cancer. A Pap test by itself is not enough to diagnose cancer. You will have more tests done if cancer is suspected. Precancerous changes in your cervix. Inflammation of the cervix. An STI (sexually transmitted infection). A fungal infection. A parasite infection. Talk with your health care provider about what your results mean. In some cases, your health care provider may do more testing to confirm the results. Questions to ask your health care provider Ask your health care provider, or the department that is doing the test: When will my results be ready? How will I get my results? What are my treatment options? What other tests do I need? What are my next steps? Summary In general, women should have a Pap test every 3 years until they reach  menopause or age 90. Your health care provider will collect a sample of cells from the surface of your cervix. This will be done using a small cotton swab, plastic spatula, or brush. In some cases, fluids (secretions) from the cervix or vagina may also be collected. This information is not intended to replace advice given to you by your health care provider. Make sure you discuss any questions you have with your health care  provider. Document Revised: 07/15/2020 Document Reviewed: 07/15/2020 Elsevier Patient Education  2024 [employer].

## 2022-10-17 ENCOUNTER — Encounter: Payer: Self-pay | Admitting: Nurse Practitioner

## 2022-10-17 ENCOUNTER — Ambulatory Visit: Payer: Medicaid Other | Admitting: Nurse Practitioner

## 2022-10-17 ENCOUNTER — Other Ambulatory Visit (HOSPITAL_COMMUNITY)
Admission: RE | Admit: 2022-10-17 | Discharge: 2022-10-17 | Disposition: A | Discharge status: Home / Self Care | Payer: Medicaid Other | Source: Ambulatory Visit | Attending: Nurse Practitioner | Admitting: Nurse Practitioner

## 2022-10-17 VITALS — BP 136/83 | HR 61 | Temp 97.6°F | Ht 64.02 in | Wt 160.8 lb

## 2022-10-17 DIAGNOSIS — Z124 Encounter for screening for malignant neoplasm of cervix: Secondary | ICD-10-CM | POA: Diagnosis not present

## 2022-10-17 DIAGNOSIS — I10 Essential (primary) hypertension: Secondary | ICD-10-CM | POA: Diagnosis not present

## 2022-10-17 DIAGNOSIS — F17210 Nicotine dependence, cigarettes, uncomplicated: Secondary | ICD-10-CM

## 2022-10-17 DIAGNOSIS — Z0000 Encounter for general adult medical examination without abnormal findings: Secondary | ICD-10-CM

## 2022-10-17 MED ORDER — OLMESARTAN MEDOXOMIL 20 MG PO TABS: 20.0000 mg | ORAL_TABLET | Freq: Every day | ORAL | 4 refills | Status: DC

## 2022-10-17 NOTE — Assessment & Plan Note (Signed)
I have recommended complete cessation of tobacco use. I have discussed various options available for assistance with tobacco cessation including over the counter methods (Nicotine gum, patch and lozenges). We also discussed prescription options (Chantix, Nicotine Inhaler / Nasal Spray). The patient is not interested in pursuing any prescription tobacco cessation options at this time.  Referral for lung cancer screening placed last visit, will check on this as has not heard from them.

## 2022-10-17 NOTE — Progress Notes (Signed)
BP 136/83   Pulse 61   Temp 97.6 F (36.4 C) (Oral)   Ht 5' 4.02" (1.626 m)   Wt 160 lb 12.8 oz (72.9 kg)   SpO2 98%   BMI 27.59 kg/m    Subjective:    Patient ID: Beth [name], female    DOB: 04-Aug-1970, 52 y.o.   MRN: [id number]  HPI: Beth [name] is a 52 y.o. female presenting on 10/17/2022 for comprehensive medical examination. Current medical complaints include:none  She currently lives with: lives with aunt Menopausal Symptoms: no  HYPERTENSION / HYPERLIPIDEMIA Currently taking Amlodipine for HTN + added on Olmesartan 5 MG at last visit.  She is tolerating this well without ADR.  Has not taken medication for HLD.    Has no drank alcohol in 4 weeks, prior to this drank every day.  Would drink a pint of liquor a day over past 10 years.  No family history of alcohol abuse.  Does have supportive family who is assisting her with cut back.  Has never attended AA or rehab.     Is a current 1 PPD smoker.  Has smoked for a good 30 years.  Mother passed from lung cancer -- was a smoker, at age 22. Satisfied with current treatment? yes Duration of hypertension: chronic BP monitoring frequency: weekly BP range: 127/87 to 166/87 -- has been trending down with Olemsartan BP medication side effects: no Past BP meds: none Duration of hyperlipidemia: chronic Cholesterol supplements: none Past cholesterol medications: none Aspirin: no Recent stressors: no Recurrent headaches: no Visual changes: no Palpitations: no Dyspnea: no Chest pain: no Lower extremity edema: no Dizzy/lightheaded: no  The 10-year ASCVD risk score (Arnett DK, et al., 2019) is: 5%   Values used to calculate the score:     Age: 69 years     Sex: Female     Is Non-Hispanic African American: No     Diabetic: No     Tobacco smoker: Yes     Systolic Blood Pressure: 136 mmHg     Is BP treated: Yes     HDL Cholesterol: 65 mg/dL     Total Cholesterol: 188 mg/dL     08/06/8117    1:47 AM 09/18/2022    9:32  AM 04/25/2021    1:27 PM 07/01/2019   11:35 AM 12/17/2018    2:27 PM  Depression screen PHQ 2/9  Decreased Interest 0 0 1 0 0  Down, Depressed, Hopeless 0 0 0 0 0  PHQ - 2 Score 0 0 1 0 0  Altered sleeping 0 1 0 3 0  Tired, decreased energy 0 1 0 3 0  Change in appetite 0 0 0 1 0  Feeling bad or failure about yourself  0 0 0 0 0  Trouble concentrating 0 0 0 0 0  Moving slowly or fidgety/restless 0 0 0 0 0  Suicidal thoughts 0 0 0 0 0  PHQ-9 Score 0 2 1 7  0  Difficult doing work/chores Not difficult at all Not difficult at all Not difficult at all Not difficult at all Not difficult at all       10/17/2022    9:25 AM 09/18/2022    9:32 AM  GAD 7 : Generalized Anxiety Score  Nervous, Anxious, on Edge 0 1  Control/stop worrying 0 0  Worry too much - different things 0 0  Trouble relaxing 0 0  Restless 0 0  Easily annoyed or irritable 0 0  Afraid -  awful might happen 0 0  Total GAD 7 Score 0 1  Anxiety Difficulty Not difficult at all Not difficult at all        12/17/2018    2:27 PM 07/01/2019   11:35 AM 04/25/2021    1:26 PM 09/18/2022    9:31 AM 10/17/2022    9:24 AM  Fall Risk  Falls in the past year? 0 0 1 0 0  Was there an injury with Fall? 0 0 0 0 0  Fall Risk Category Calculator 0 0 2 0 0  Fall Risk Category (Retired) Low Low Moderate    (RETIRED) Patient Fall Risk Level Low fall risk Low fall risk Moderate fall risk    Patient at Risk for Falls Due to   Impaired balance/gait No Fall Risks No Fall Risks  Fall risk Follow up   Falls prevention discussed Falls evaluation completed Falls evaluation completed    Past Medical History:  Past Medical History:  Diagnosis Date   Abnormal gamma-glutamyl transferase test    Adjustment disorder with mixed anxiety and depressed mood    Adrenal nodule (HCC) 10/01/2017   Due for repeat CT scan June 2020   Alcohol abuse    Alcoholic peripheral neuropathy (HCC)    Anemia    Arthritis    Asthma    Cardiac murmur    Chronic  alcoholic hepatitis    Chronic alcoholism (HCC)    Chronic alcoholism in remission (HCC)    Chronic back pain    COPD (chronic obstructive pulmonary disease) (HCC)    Elevated liver enzymes Jan 2014   referred to and seen by GI   Fatty liver    GERD (gastroesophageal reflux disease)    Hiatal hernia    Hypertension    Macrocytosis    alcoholic   Ruptured tubal pregnancy    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   amLODipine (NORVASC) 10 MG tablet Take 1 tablet (10 mg total) by mouth daily.   Cholecalciferol (VITAMIN D3) 50 MCG (2000 UT) capsule Take 1 capsule (2,000 Units total) by mouth daily.   gabapentin (NEURONTIN) 300 MG capsule TAKE 1 CAPSULE BY MOUTH IN THE MORNING, 1 CAPSULE IN THE AFTERNOON, AND 2 CAPSULES AT BEDTIME   omeprazole (PRILOSEC) 10 MG capsule Take 1 capsule (10 mg total) by mouth daily.   traZODone (DESYREL) 50 MG tablet TAKE 1/2 TO 1 (ONE-HALF TO ONE) TABLET BY MOUTH AT BEDTIME AS NEEDED FOR SLEEP   No current facility-administered medications on file prior to visit.    Allergies:  Allergies  Allergen Reactions   Latex    Penicillins Hives   Erythromycin Itching and Rash    Social History:  Social History   Socioeconomic History   Marital status: Widowed    Spouse name: Not on file   Number of children: 0   Years of education: 12th   Highest education level: 12th grade  Occupational History    Employer: OTHER  Comment: n/a  Tobacco Use   Smoking status: Every Day     Packs/day: 1.00    Years: 25.00    Additional pack years: 0.00    Total pack years: 25.00    Types: Cigarettes   Smokeless tobacco: Never  Vaping Use   Vaping Use: Never used  Substance and Sexual Activity   Alcohol use: Yes    Comment: very seldom   Drug use: No   Sexual activity: Yes  Other Topics Concern   Not on file  Social History Narrative   Patient lives at home with her family.   Caffeine Use: daily   Social Determinants of Health   Financial Resource Strain: Low Risk  (10/13/2022)   Overall Financial Resource Strain (CARDIA)    Difficulty of Paying Living Expenses: Not hard at all  Food Insecurity: No Food Insecurity (10/13/2022)   Hunger Vital Sign    Worried About Running Out of Food in the Last Year: Never true    Ran Out of Food in the Last Year: Never true  Transportation Needs: No Transportation Needs (10/13/2022)   PRAPARE - Administrator, Civil Service (Medical): No    Lack of Transportation (Non-Medical): No  Physical Activity: Unknown (10/13/2022)   Exercise Vital Sign    Days of Exercise per Week: Patient declined    Minutes of Exercise per Session: 20 min  Recent Concern: Physical Activity - Insufficiently Active (09/18/2022)   Exercise Vital Sign    Days of Exercise per Week: 2 days    Minutes of Exercise per Session: 20 min  Stress: Stress Concern Present (10/13/2022)   [employer] of Occupational Health - Occupational Stress Questionnaire    Feeling of Stress : Rather much  Social Connections: Socially Isolated (10/13/2022)   Social Connection and Isolation Panel [NHANES]    Frequency of Communication with Friends and Family: More than three times a week    Frequency of Social Gatherings with Friends and Family: More than three times a week    Attends Religious Services: Never    Database administrator or Organizations: No    Attends Banker Meetings: Never    Marital Status: Widowed  Intimate Partner Violence: Not At  Risk (09/18/2022)   Humiliation, Afraid, Rape, and Kick questionnaire    Fear of Current or Ex-Partner: No    Emotionally Abused: No    Physically Abused: No    Sexually Abused: No   Social History   Tobacco Use  Smoking Status Every Day   Packs/day: 1.00   Years: 25.00   Additional pack years: 0.00   Total pack years: 25.00   Types: Cigarettes  Smokeless Tobacco Never   Social History   Substance and Sexual Activity  Alcohol Use Yes   Comment: very seldom    Family History:  Family History  Problem Relation Age of Onset   Cancer Mother        lung   Kidney failure Father    Diabetes Father    Hypertension Father    Heart disease Father    Heart attack Father    Hypertension Sister    Cancer Brother        lung, liver   Diabetes Brother    Hypertension Brother    Cancer Brother        lung, brain   Diabetes Maternal Grandmother    Hypertension Maternal Grandmother    Hypertension Maternal Grandfather    Hypertension Paternal  Grandmother    Hypertension Paternal Grandfather    Past medical history, surgical history, medications, allergies, family history and social history reviewed with patient today and changes made to appropriate areas of the chart.   ROS All other ROS negative except what is listed above and in the HPI.      Objective:    BP 136/83   Pulse 61   Temp 97.6 F (36.4 C) (Oral)   Ht 5' 4.02" (1.626 m)   Wt 160 lb 12.8 oz (72.9 kg)   SpO2 98%   BMI 27.59 kg/m   Wt Readings from Last 3 Encounters:  10/17/22 160 lb 12.8 oz (72.9 kg)  09/18/22 150 lb 12.8 oz (68.4 kg)  04/25/21 168 lb 6.4 oz (76.4 kg)    Physical Exam Vitals and nursing note reviewed. Exam conducted with a chaperone present.  Constitutional:      General: She is awake. She is not in acute distress.    Appearance: She is well-developed and well-groomed. She is not ill-appearing or toxic-appearing.  HENT:     Head: Normocephalic and atraumatic.     Right Ear:  Hearing, tympanic membrane, ear canal and external ear normal. No drainage.     Left Ear: Hearing, tympanic membrane, ear canal and external ear normal. No drainage.     Nose: Nose normal.     Right Sinus: No maxillary sinus tenderness or frontal sinus tenderness.     Left Sinus: No maxillary sinus tenderness or frontal sinus tenderness.     Mouth/Throat:     Mouth: Mucous membranes are moist.     Pharynx: Oropharynx is clear. Uvula midline. No pharyngeal swelling, oropharyngeal exudate or posterior oropharyngeal erythema.  Eyes:     General: Lids are normal.        Right eye: No discharge.        Left eye: No discharge.     Extraocular Movements: Extraocular movements intact.     Conjunctiva/sclera: Conjunctivae normal.     Pupils: Pupils are equal, round, and reactive to light.     Visual Fields: Right eye visual fields normal and left eye visual fields normal.  Neck:     Thyroid: No thyromegaly.     Vascular: No carotid bruit.     Trachea: Trachea normal.  Cardiovascular:     Rate and Rhythm: Normal rate and regular rhythm.     Heart sounds: Normal heart sounds. No murmur heard.    No gallop.  Pulmonary:     Effort: Pulmonary effort is normal. No accessory muscle usage or respiratory distress.     Breath sounds: Normal breath sounds.  Chest:  Breasts:    Right: Normal.     Left: Normal.  Abdominal:     General: Bowel sounds are normal.     Palpations: Abdomen is soft. There is no hepatomegaly or splenomegaly.     Tenderness: There is no abdominal tenderness.     Hernia: There is no hernia in the left inguinal area or right inguinal area.  Genitourinary:    Exam position: Lithotomy position.     Pubic Area: No rash.      Labia:        Right: No rash.        Left: No rash.      Vagina: Normal.     Cervix: Normal.     Uterus: Normal.      Adnexa: Right adnexa normal and left adnexa normal.     Comments:  Cervix viewed, slightly posterior.  Mild vaginal atrophy  noted. Musculoskeletal:        General: Normal range of motion.     Cervical back: Normal range of motion and neck supple.     Right lower leg: No edema.     Left lower leg: No edema.  Lymphadenopathy:     Head:     Right side of head: No submental, submandibular, tonsillar, preauricular or posterior auricular adenopathy.     Left side of head: No submental, submandibular, tonsillar, preauricular or posterior auricular adenopathy.     Cervical: No cervical adenopathy.     Upper Body:     Right upper body: No supraclavicular, axillary or pectoral adenopathy.     Left upper body: No supraclavicular, axillary or pectoral adenopathy.  Skin:    General: Skin is warm and dry.     Capillary Refill: Capillary refill takes less than 2 seconds.     Findings: No rash.  Neurological:     Mental Status: She is alert and oriented to person, place, and time.     Gait: Gait is intact.     Deep Tendon Reflexes: Reflexes are normal and symmetric.     Reflex Scores:      Brachioradialis reflexes are 2+ on the right side and 2+ on the left side.      Patellar reflexes are 2+ on the right side and 2+ on the left side. Psychiatric:        Attention and Perception: Attention normal.        Mood and Affect: Mood normal.        Speech: Speech normal.        Behavior: Behavior normal. Behavior is cooperative.        Thought Content: Thought content normal.        Judgment: Judgment normal.    Results for orders placed or performed in visit on 09/18/22  Vitamin B12  Result Value Ref Range   Vitamin B-12 522 232 - 1,245 pg/mL  VITAMIN D 25 Hydroxy (Vit-D Deficiency, Fractures)  Result Value Ref Range   Vit D, 25-Hydroxy 22.0 (L) 30.0 - 100.0 ng/mL  TSH  Result Value Ref Range   TSH 0.541 0.450 - 4.500 uIU/mL  Lipid Panel w/o Chol/HDL Ratio  Result Value Ref Range   Cholesterol, Total 188 100 - 199 mg/dL   Triglycerides 68 0 - 149 mg/dL   HDL 65 >16 mg/dL   VLDL Cholesterol Cal 13 5 - 40 mg/dL    LDL Chol Calc (NIH) 110 (H) 0 - 99 mg/dL  Magnesium  Result Value Ref Range   Magnesium 2.2 1.6 - 2.3 mg/dL  CBC with Differential/Platelet  Result Value Ref Range   WBC 9.3 3.4 - 10.8 x10E3/uL   RBC 5.34 (H) 3.77 - 5.28 x10E6/uL   Hemoglobin 16.2 (H) 11.1 - 15.9 g/dL   Hematocrit 10.9 (H) 60.4 - 46.6 %   MCV 94 79 - 97 fL   MCH 30.3 26.6 - 33.0 pg   MCHC 32.4 31.5 - 35.7 g/dL   RDW 54.0 98.1 - 19.1 %   Platelets 234 150 - 450 x10E3/uL   Neutrophils 68 Not Estab. %   Lymphs 24 Not Estab. %   Monocytes 6 Not Estab. %   Eos 1 Not Estab. %   Basos 1 Not Estab. %   Neutrophils Absolute 6.4 1.4 - 7.0 x10E3/uL   Lymphocytes Absolute 2.2 0.7 - 3.1 x10E3/uL   Monocytes Absolute  0.5 0.1 - 0.9 x10E3/uL   EOS (ABSOLUTE) 0.1 0.0 - 0.4 x10E3/uL   Basophils Absolute 0.1 0.0 - 0.2 x10E3/uL   Immature Granulocytes 0 Not Estab. %   Immature Grans (Abs) 0.0 0.0 - 0.1 x10E3/uL  Comprehensive metabolic panel  Result Value Ref Range   Glucose 85 70 - 99 mg/dL   BUN 12 6 - 24 mg/dL   Creatinine, Ser 1.61 0.57 - 1.00 mg/dL   eGFR 91 >09 UE/AVW/0.98   BUN/Creatinine Ratio 15 9 - 23   Sodium 139 134 - 144 mmol/L   Potassium 4.1 3.5 - 5.2 mmol/L   Chloride 101 96 - 106 mmol/L   CO2 21 20 - 29 mmol/L   Calcium 9.2 8.7 - 10.2 mg/dL   Total Protein 6.9 6.0 - 8.5 g/dL   Albumin 4.5 3.8 - 4.9 g/dL   Globulin, Total 2.4 1.5 - 4.5 g/dL   Albumin/Globulin Ratio 1.9 1.2 - 2.2   Bilirubin Total <0.2 0.0 - 1.2 mg/dL   Alkaline Phosphatase 93 44 - 121 IU/L   AST 19 0 - 40 IU/L   ALT 14 0 - 32 IU/L  Folate  Result Value Ref Range   Folate 5.2 >3.0 ng/mL  Iron Binding Cap (TIBC)([facility])  Result Value Ref Range   Total Iron Binding Capacity 346 250 - 450 ug/dL   UIBC 119 147 - 829 ug/dL   Iron 84 27 - 562 ug/dL   Iron Saturation 24 15 - 55 %  Ferritin  Result Value Ref Range   Ferritin 116 15 - 150 ng/mL      Assessment & Plan:   Problem List Items Addressed This Visit        Cardiovascular and Mediastinum   Hypertension - Primary (Chronic)    Chronic, ongoing.  BP slightly above goal in office today and on home readings.  Will continue Amlodipine 10 MG daily and increase Olmesartan to 20 MG daily.  Educated her on this medication - use and side effects to monitor for.  Discussed kidney protective element of ARB family.  Would avoid ACE due to her underlying COPD.  Recommend she monitor BP at least a few mornings a week at home and document.  DASH diet at home.  Continue current medication regimen and adjust as needed.  Labs today: BMP.  Obtain urine ALB in future.  Return in 6 weeks.       Relevant Medications   olmesartan (BENICAR) 20 MG tablet   Other Relevant Orders   Basic metabolic panel     Other   Nicotine dependence, cigarettes, uncomplicated    I have recommended complete cessation of tobacco use. I have discussed various options available for assistance with tobacco cessation including over the counter methods (Nicotine gum, patch and lozenges). We also discussed prescription options (Chantix, Nicotine Inhaler / Nasal Spray). The patient is not interested in pursuing any prescription tobacco cessation options at this time.  Referral for lung cancer screening placed last visit, will check on this as has not heard from them.       Other Visit Diagnoses     Cervical cancer screening       Pap testing in office today and sent to lab.   Relevant Orders   Cytology - PAP        Follow up plan: Return in about 6 weeks (around 11/28/2022) for HTN -- increased Olmesartan to 20 MG .   LABORATORY TESTING:  - Pap smear: pap done  IMMUNIZATIONS:   - Tdap: Tetanus vaccination status reviewed: last tetanus booster within 10 years. - Influenza: Up to date - Pneumovax: Not applicable - Prevnar: Not applicable - COVID: Refused - HPV: Not applicable - Shingrix vaccine: Refused  SCREENING: -Mammogram: Ordered today  - Colonoscopy: Refused -- discussed  options at length - Bone Density: Not applicable  -Hearing Test: Not applicable  -Spirometry: Not applicable   PATIENT COUNSELING:   Advised to take 1 mg of folate supplement per day if capable of pregnancy.   Sexuality: Discussed sexually transmitted diseases, partner selection, use of condoms, avoidance of unintended pregnancy  and contraceptive alternatives.   Advised to avoid cigarette smoking.  I discussed with the patient that most people either abstain from alcohol or drink within safe limits (<=14/week and <=4 drinks/occasion for males, <=7/weeks and <= 3 drinks/occasion for females) and that the risk for alcohol disorders and other health effects rises proportionally with the number of drinks per week and how often a drinker exceeds daily limits.  Discussed cessation/primary prevention of drug use and availability of treatment for abuse.   Diet: Encouraged to adjust caloric intake to maintain  or achieve ideal body weight, to reduce intake of dietary saturated fat and total fat, to limit sodium intake by avoiding high sodium foods and not adding table salt, and to maintain adequate dietary potassium and calcium preferably from fresh fruits, vegetables, and low-fat dairy products.    Stressed the importance of regular exercise  Injury prevention: Discussed safety belts, safety helmets, smoke detector, smoking near bedding or upholstery.   Dental health: Discussed importance of regular tooth brushing, flossing, and dental visits.    NEXT PREVENTATIVE PHYSICAL DUE IN 1 YEAR. Return in about 6 weeks (around 11/28/2022) for HTN -- increased Olmesartan to 20 MG .

## 2022-10-17 NOTE — Assessment & Plan Note (Signed)
Chronic, ongoing.  BP slightly above goal in office today and on home readings.  Will continue Amlodipine 10 MG daily and increase Olmesartan to 20 MG daily.  Educated her on this medication - use and side effects to monitor for.  Discussed kidney protective element of ARB family.  Would avoid ACE due to her underlying COPD.  Recommend she monitor BP at least a few mornings a week at home and document.  DASH diet at home.  Continue current medication regimen and adjust as needed.  Labs today: BMP.  Obtain urine ALB in future.  Return in 6 weeks.

## 2022-10-18 LAB — BASIC METABOLIC PANEL
BUN/Creatinine Ratio: 13 (ref 9–23)
BUN: 11 mg/dL (ref 6–24)
CO2: 26 mmol/L (ref 20–29)
Calcium: 9.2 mg/dL (ref 8.7–10.2)
Chloride: 100 mmol/L (ref 96–106)
Creatinine, Ser: 0.86 mg/dL (ref 0.57–1.00)
Glucose: 65 mg/dL — ABNORMAL LOW (ref 70–99)
Potassium: 4.4 mmol/L (ref 3.5–5.2)
Sodium: 140 mmol/L (ref 134–144)
eGFR: 81 mL/min/{1.73_m2} (ref >59)

## 2022-10-18 NOTE — Progress Notes (Signed)
Contacted via MyChart   Good morning Kajuana your labs have returned: - Kidney function, creatinine and eGFR, remains normal, and electrolytes overall all stable.  Continue current medication regimen.  Any questions? Keep being amazing!!  Thank you for allowing me to participate in your care.  I appreciate you. Kindest regards, Belma Dyches

## 2022-10-22 ENCOUNTER — Other Ambulatory Visit: Payer: Self-pay | Admitting: Nurse Practitioner

## 2022-10-22 LAB — CYTOLOGY - PAP
Comment: NEGATIVE
Diagnosis: UNDETERMINED — AB
High risk HPV: NEGATIVE

## 2022-10-22 MED ORDER — METRONIDAZOLE 500 MG PO TABS: 500.0000 mg | ORAL_TABLET | Freq: Two times a day (BID) | ORAL | 0 refills | Status: AC

## 2022-10-22 NOTE — Progress Notes (Signed)
Contacted via MyChart   Good afternoon Beth [name], your pap has returned and two things: - It is showing some bacterial vaginosis which is a build up of our natural bacteria that turns into infection.  For this I will send in Flagyl 500 MG BID for 7 days which should clear infection.  This is not sexually transmitted. - Your pap shows some abnormal cells (ASC-US), but HPV testing is negative -- the HPV testing being negative is good news as this is something that can show higher cancer risks.  For this when I inserted recommendations the guidelines recommend repeat pap in 3 years with HPV testing.  Any questions?

## 2022-11-20 ENCOUNTER — Ambulatory Visit: Payer: Medicaid Other | Admitting: Internal Medicine

## 2022-11-25 NOTE — Patient Instructions (Incomplete)
Lung Cancer Screening -- It is [phone].  Beth [name] lung screening  Be Involved in Caring For Your Health:  Taking Medications When medications are taken as directed, they can greatly improve your health. But if they are not taken as prescribed, they may not work. In some cases, not taking them correctly can be harmful. To help ensure your treatment remains effective and safe, understand your medications and how to take them. Bring your medications to each visit for review by your provider.  Your lab results, notes, and after visit summary will be available on My Chart. We strongly encourage you to use this feature. If lab results are abnormal the clinic will contact you with the appropriate steps. If the clinic does not contact you assume the results are satisfactory. You can always view your results on My Chart. If you have questions regarding your health or results, please contact the clinic during office hours. You can also ask questions on My Chart.  We at [facility] are grateful that you chose [place] to provide your care. We strive to provide evidence-based and compassionate care and are always looking for feedback. If you get a survey from the clinic please complete this so we can hear your opinions.  DASH Eating Plan DASH stands for Dietary Approaches to Stop Hypertension. The DASH eating plan is a healthy eating plan that has been shown to: Lower high blood pressure (hypertension). Reduce your risk for type 2 diabetes, heart disease, and stroke. Help with weight loss. What are tips for following this plan? Reading food labels Check food labels for the amount of salt (sodium) per serving. Choose foods with less than 5 percent of the Daily Value (DV) of sodium. In general, foods with less than 300 milligrams (mg) of sodium per serving fit into this eating plan. To find whole grains, look for the word "whole" as the first word in the ingredient list. Shopping Buy products  labeled as "low-sodium" or "no salt added." Buy fresh foods. Avoid canned foods and pre-made or frozen meals. Cooking Try not to add salt when you cook. Use salt-free seasonings or herbs instead of table salt or sea salt. Check with your health care provider or pharmacist before using salt substitutes. Do not fry foods. Cook foods in healthy ways, such as baking, boiling, grilling, roasting, or broiling. Cook using oils that are good for your heart. These include olive, canola, avocado, soybean, and sunflower oil. Meal planning  Eat a balanced diet. This should include: 4 or more servings of fruits and 4 or more servings of vegetables each day. Try to fill half of your plate with fruits and vegetables. 6-8 servings of whole grains each day. 6 or less servings of lean meat, poultry, or fish each day. 1 oz is 1 serving. A 3 oz (85 g) serving of meat is about the same size as the palm of your hand. One egg is 1 oz (28 g). 2-3 servings of low-fat dairy each day. One serving is 1 cup (237 mL). 1 serving of nuts, seeds, or beans 5 times each week. 2-3 servings of heart-healthy fats. Healthy fats called omega-3 fatty acids are found in foods such as walnuts, flaxseeds, fortified milks, and eggs. These fats are also found in cold-water fish, such as sardines, salmon, and mackerel. Limit how much you eat of: Canned or prepackaged foods. Food that is high in trans fat, such as fried foods. Food that is high in saturated fat, such as fatty meat. Desserts  and other sweets, sugary drinks, and other foods with added sugar. Full-fat dairy products. Do not salt foods before eating. Do not eat more than 4 egg yolks a week. Try to eat at least 2 vegetarian meals a week. Eat more home-cooked food and less restaurant, buffet, and fast food. Lifestyle When eating at a restaurant, ask if your food can be made with less salt or no salt. If you drink alcohol: Limit how much you have to: 0-1 drink a day if you  are female. 0-2 drinks a day if you are female. Know how much alcohol is in your drink. In the [place] one drink is one 12 oz bottle of beer (355 mL), one 5 oz glass of wine (148 mL), or one 1 oz glass of hard liquor (44 mL). General information Avoid eating more than 2,300 mg of salt a day. If you have hypertension, you may need to reduce your sodium intake to 1,500 mg a day. Work with your provider to stay at a healthy body weight or lose weight. Ask what the best weight range is for you. On most days of the week, get at least 30 minutes of exercise that causes your heart to beat faster. This may include walking, swimming, or biking. Work with your provider or dietitian to adjust your eating plan to meet your specific calorie needs. What foods should I eat? Fruits All fresh, dried, or frozen fruit. Canned fruits that are in their natural juice and do not have sugar added to them. Vegetables Fresh or frozen vegetables that are raw, steamed, roasted, or grilled. Low-sodium or reduced-sodium tomato and vegetable juice. Low-sodium or reduced-sodium tomato sauce and tomato paste. Low-sodium or reduced-sodium canned vegetables. Grains Whole-grain or whole-wheat bread. Whole-grain or whole-wheat pasta. Brown rice. Orpah Cobb. Bulgur. Whole-grain and low-sodium cereals. Pita bread. Low-fat, low-sodium crackers. Whole-wheat flour tortillas. Meats and other proteins Skinless chicken or [place]. Ground chicken or [place]. Pork with fat trimmed off. Fish and seafood. Egg whites. Dried beans, peas, or lentils. Unsalted nuts, nut butters, and seeds. Unsalted canned beans. Lean cuts of beef with fat trimmed off. Low-sodium, lean precooked or cured meat, such as sausages or meat loaves. Dairy Low-fat (1%) or fat-free (skim) milk. Reduced-fat, low-fat, or fat-free cheeses. Nonfat, low-sodium ricotta or cottage cheese. Low-fat or nonfat yogurt. Low-fat, low-sodium cheese. Fats and oils Soft margarine without  trans fats. Vegetable oil. Reduced-fat, low-fat, or light mayonnaise and salad dressings (reduced-sodium). Canola, safflower, olive, avocado, soybean, and sunflower oils. Avocado. Seasonings and condiments Herbs. Spices. Seasoning mixes without salt. Other foods Unsalted popcorn and pretzels. Fat-free sweets. The items listed above may not be all the foods and drinks you can have. Talk to a dietitian to learn more. What foods should I avoid? Fruits Canned fruit in a light or heavy syrup. Fried fruit. Fruit in cream or butter sauce. Vegetables Creamed or fried vegetables. Vegetables in a cheese sauce. Regular canned vegetables that are not marked as low-sodium or reduced-sodium. Regular canned tomato sauce and paste that are not marked as low-sodium or reduced-sodium. Regular tomato and vegetable juices that are not marked as low-sodium or reduced-sodium. Rosita Fire. Olives. Grains Baked goods made with fat, such as croissants, muffins, or some breads. Dry pasta or rice meal packs. Meats and other proteins Fatty cuts of meat. Ribs. Fried meat. Tomasa Blase. Bologna, salami, and other precooked or cured meats, such as sausages or meat loaves, that are not lean and low in sodium. Fat from the back of a pig (  fatback). Bratwurst. Salted nuts and seeds. Canned beans with added salt. Canned or smoked fish. Whole eggs or egg yolks. Chicken or [place] with skin. Dairy Whole or 2% milk, cream, and half-and-half. Whole or full-fat cream cheese. Whole-fat or sweetened yogurt. Full-fat cheese. Nondairy creamers. Whipped toppings. Processed cheese and cheese spreads. Fats and oils Butter. Stick margarine. Lard. Shortening. Ghee. Bacon fat. Tropical oils, such as coconut, palm kernel, or palm oil. Seasonings and condiments Onion salt, garlic salt, seasoned salt, table salt, and sea salt. Worcestershire sauce. Tartar sauce. Barbecue sauce. Teriyaki sauce. Soy sauce, including reduced-sodium soy sauce. Steak sauce. Canned  and packaged gravies. Fish sauce. Oyster sauce. Cocktail sauce. Store-bought horseradish. Ketchup. Mustard. Meat flavorings and tenderizers. Bouillon cubes. Hot sauces. Pre-made or packaged marinades. Pre-made or packaged taco seasonings. Relishes. Regular salad dressings. Other foods Salted popcorn and pretzels. The items listed above may not be all the foods and drinks you should avoid. Talk to a dietitian to learn more. Where to find more information National Heart, Lung, and Blood Institute (NHLBI): BuffaloDryCleaner.gl American Heart Association (AHA): heart.org Academy of Nutrition and Dietetics: eatright.org National Kidney Foundation (NKF): kidney.org This information is not intended to replace advice given to you by your health care provider. Make sure you discuss any questions you have with your health care provider. Document Revised: 05/03/2022 Document Reviewed: 05/03/2022 Elsevier Patient Education  2024 [employer].

## 2022-11-28 ENCOUNTER — Ambulatory Visit: Payer: Medicaid Other | Admitting: Nurse Practitioner

## 2022-11-28 ENCOUNTER — Encounter: Payer: Self-pay | Admitting: Nurse Practitioner

## 2022-11-28 VITALS — BP 104/68 | HR 59 | Ht 64.0 in | Wt 158.2 lb

## 2022-11-28 DIAGNOSIS — F17210 Nicotine dependence, cigarettes, uncomplicated: Secondary | ICD-10-CM | POA: Diagnosis not present

## 2022-11-28 DIAGNOSIS — I10 Essential (primary) hypertension: Secondary | ICD-10-CM

## 2022-11-28 MED ORDER — OLMESARTAN MEDOXOMIL 20 MG PO TABS: 20.0000 mg | ORAL_TABLET | Freq: Every day | ORAL | 4 refills | Status: DC

## 2022-11-28 NOTE — Assessment & Plan Note (Signed)
Chronic, improved.  BP has trended down to stable levels.  Will continue Amlodipine 10 MG daily and Olmesartan to 20 MG daily.  Educated her on this medication - use and side effects to monitor for.  Discussed kidney protective element of ARB family.  Would avoid ACE due to her underlying COPD.  Recommend she monitor BP at least a few mornings a week at home and document.  DASH diet at home.  Continue current medication regimen and adjust as needed.  Labs today: BMP.  Obtain urine ALB in future.  Return in 6 months

## 2022-11-28 NOTE — Assessment & Plan Note (Addendum)
I have recommended complete cessation of tobacco use. I have discussed various options available for assistance with tobacco cessation including over the counter methods (Nicotine gum, patch and lozenges). We also discussed prescription options (Chantix, Nicotine Inhaler / Nasal Spray). The patient is not interested in pursuing any prescription tobacco cessation options at this time.  Referral for lung cancer screening placed last visit, provided her number to call and schedule.

## 2022-11-28 NOTE — Progress Notes (Signed)
BP 104/68   Pulse (!) 59   Ht 5\' 4"  (1.626 m)   Wt 158 lb 3.2 oz (71.8 kg)   SpO2 94%   BMI 27.15 kg/m    Subjective:    Patient ID: Beth [name], female    DOB: 1970-12-16, 51 y.o.   MRN: [id number]  HPI: Beth [name] is a 52 y.o. female  Chief Complaint  Patient presents with   Hypertension    Patient says she has noticed a drop in her blood pressure readings since increase.    HYPERTENSION without Chronic Kidney Disease Seen on 10/17/22 and Olmesartan increased to 20 MG.  Currently has cut back to 1/2 PPD smoking. Hypertension status: stable  Satisfied with current treatment? yes Duration of hypertension: chronic BP monitoring frequency:  daily BP range: 102/64 to 138/75 -- they have trended down to 110/70 range = 60-70 HR BP medication side effects:  no Medication compliance: good compliance Aspirin: no Recurrent headaches: no Visual changes: no Palpitations: no Dyspnea: no Chest pain: no Lower extremity edema: no Dizzy/lightheaded: no   Relevant past medical, surgical, family and social history reviewed and updated as indicated. Interim medical history since our last visit reviewed. Allergies and medications reviewed and updated.  Review of Systems  Constitutional:  Negative for activity change, appetite change, diaphoresis, fatigue and fever.  Respiratory:  Negative for cough, chest tightness and shortness of breath.   Cardiovascular:  Negative for chest pain, palpitations and leg swelling.  Neurological: Negative.   Psychiatric/Behavioral: Negative.      Per HPI unless specifically indicated above     Objective:    BP 104/68   Pulse (!) 59   Ht 5\' 4"  (1.626 m)   Wt 158 lb 3.2 oz (71.8 kg)   SpO2 94%   BMI 27.15 kg/m   Wt Readings from Last 3 Encounters:  11/28/22 158 lb 3.2 oz (71.8 kg)  10/17/22 160 lb 12.8 oz (72.9 kg)  09/18/22 150 lb 12.8 oz (68.4 kg)    Physical Exam Vitals and nursing note reviewed.  Constitutional:      General:  She is awake. She is not in acute distress.    Appearance: She is well-developed and well-groomed. She is not ill-appearing or toxic-appearing.  HENT:     Head: Normocephalic.     Right Ear: Hearing and external ear normal.     Left Ear: Hearing and external ear normal.  Eyes:     General: Lids are normal.        Right eye: No discharge.        Left eye: No discharge.     Conjunctiva/sclera: Conjunctivae normal.     Pupils: Pupils are equal, round, and reactive to light.  Neck:     Thyroid: No thyromegaly.     Vascular: No carotid bruit.  Cardiovascular:     Rate and Rhythm: Normal rate and regular rhythm.     Heart sounds: Normal heart sounds. No murmur heard.    No gallop.  Pulmonary:     Effort: Pulmonary effort is normal. No accessory muscle usage or respiratory distress.     Breath sounds: Normal breath sounds.  Abdominal:     General: Bowel sounds are normal. There is no distension.     Palpations: Abdomen is soft.     Tenderness: There is no abdominal tenderness.  Musculoskeletal:     Cervical back: Normal range of motion and neck supple.     Right lower leg:  No edema.     Left lower leg: No edema.  Lymphadenopathy:     Cervical: No cervical adenopathy.  Skin:    General: Skin is warm and dry.  Neurological:     Mental Status: She is alert and oriented to person, place, and time.     Deep Tendon Reflexes: Reflexes are normal and symmetric.     Reflex Scores:      Brachioradialis reflexes are 2+ on the right side and 2+ on the left side.      Patellar reflexes are 2+ on the right side and 2+ on the left side. Psychiatric:        Attention and Perception: Attention normal.        Mood and Affect: Mood normal.        Speech: Speech normal.        Behavior: Behavior normal. Behavior is cooperative.        Thought Content: Thought content normal.     Results for orders placed or performed in visit on 10/17/22  Basic metabolic panel  Result Value Ref Range    Glucose 65 (L) 70 - 99 mg/dL   BUN 11 6 - 24 mg/dL   Creatinine, Ser 3.24 0.57 - 1.00 mg/dL   eGFR 81 >40 NU/UVO/5.36   BUN/Creatinine Ratio 13 9 - 23   Sodium 140 134 - 144 mmol/L   Potassium 4.4 3.5 - 5.2 mmol/L   Chloride 100 96 - 106 mmol/L   CO2 26 20 - 29 mmol/L   Calcium 9.2 8.7 - 10.2 mg/dL  Cytology - PAP  Result Value Ref Range   High risk HPV Negative    Adequacy      Satisfactory for evaluation; transformation zone component PRESENT.   Diagnosis (A)     - Atypical squamous cells of undetermined significance (ASC-US)   Microorganisms Shift in flora suggestive of bacterial vaginosis    Comment Normal Reference Range HPV - Negative       Assessment & Plan:   Problem List Items Addressed This Visit       Cardiovascular and Mediastinum   Hypertension - Primary (Chronic)    Chronic, improved.  BP has trended down to stable levels.  Will continue Amlodipine 10 MG daily and Olmesartan to 20 MG daily.  Educated her on this medication - use and side effects to monitor for.  Discussed kidney protective element of ARB family.  Would avoid ACE due to her underlying COPD.  Recommend she monitor BP at least a few mornings a week at home and document.  DASH diet at home.  Continue current medication regimen and adjust as needed.  Labs today: BMP.  Obtain urine ALB in future.  Return in 6 months       Relevant Medications   olmesartan (BENICAR) 20 MG tablet   Other Relevant Orders   Basic metabolic panel     Other   Nicotine dependence, cigarettes, uncomplicated    I have recommended complete cessation of tobacco use. I have discussed various options available for assistance with tobacco cessation including over the counter methods (Nicotine gum, patch and lozenges). We also discussed prescription options (Chantix, Nicotine Inhaler / Nasal Spray). The patient is not interested in pursuing any prescription tobacco cessation options at this time.  Referral for lung cancer screening  placed last visit, provided her number to call and schedule.         Follow up plan: Return in about 6 months (around 05/31/2023)  for HTN/HLD, SMOKER, COPD, Alcohol use, MOOD, Neuropathy, Vit D.

## 2022-11-29 NOTE — Progress Notes (Signed)
Contacted via MyChart   Good morning Torren, your labs have returned and overall these look great.  No changes needed. Keep being stellar!!  Thank you for allowing me to participate in your care.  I appreciate you. Kindest regards, Savior Himebaugh

## 2023-01-30 ENCOUNTER — Encounter: Payer: Self-pay | Admitting: Nurse Practitioner

## 2023-01-31 ENCOUNTER — Ambulatory Visit: Payer: Self-pay | Admitting: *Deleted

## 2023-01-31 ENCOUNTER — Telehealth: Payer: Medicaid Other | Admitting: Family Medicine

## 2023-01-31 DIAGNOSIS — U071 COVID-19: Secondary | ICD-10-CM | POA: Diagnosis not present

## 2023-01-31 MED ORDER — NIRMATRELVIR/RITONAVIR (PAXLOVID)TABLET: 3.0000 | ORAL_TABLET | Freq: Two times a day (BID) | ORAL | 0 refills | Status: AC

## 2023-01-31 MED ORDER — BENZONATATE 100 MG PO CAPS: 100.0000 mg | ORAL_CAPSULE | Freq: Two times a day (BID) | ORAL | 0 refills | Status: AC | PRN

## 2023-01-31 NOTE — Telephone Encounter (Signed)
Chief Complaint: covid positive at home test last night. Requesting paxlovid Symptoms: cough productive clear mucus, SOB with exertion and now at rest laying down and while wearing a mask.  Sore throat, runny nose headache, diarrhea . O2 sat at 95% on RA. Hx COPD and a smoker. Frequency: 2 days ago  Pertinent Negatives: Patient denies chest pain no difficulty breathing, no fever Disposition: [] ED /[] Urgent Care (no appt availability in office) / [x] Appointment(In office/virtual)/ []  [facility]/ [] Home Care/ [] Refused Recommended Disposition /[] [facility] [place] [facility]/ []  Follow-up with PCP Additional Notes:   Scheduled My chart VV for today . Please advise if patient needs OV due to hx COPD. Patient reports she has no transportation today but possibly could tomorrow. 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  Summary: covid   Pt states that she tested positive for Covid and is wanting to see if Paxlovid can be called in for her. Symptoms: sore throat, runny nose, coughing, slight headache, and diarrhea. Please advise.       [id number] - [place], [place] - [address] Phone: [phone] Fax: [phone]             Reason for Disposition  MILD difficulty breathing (e.g., minimal/no SOB at rest, SOB with walking, pulse <100)  Answer Assessment - Initial Assessment Questions 1. COVID-19 DIAGNOSIS: "How do you know that you have COVID?" (e.g., positive lab test or self-test, diagnosed by doctor or NP/PA, symptoms after exposure).     At home covid test positive last night  2. COVID-19 EXPOSURE: "Was there any known exposure to COVID before the symptoms began?" CDC Definition of close contact: within 6 feet (2 meters) for a total of 15 minutes or more over a 24-hour  period.      aunt 3. ONSET: "When did the COVID-19 symptoms start?"      2 days ago  4. WORST SYMPTOM: "What is your worst symptom?" (e.g., cough, fever, shortness of breath, muscle aches)     Cough , sore throat, shortness of breath worsening and noted at rest laying down, with exertion 5. COUGH: "Do you have a cough?" If Yes, ask: "How bad is the cough?"        Yes productive clear mucus   6. FEVER: "Do you have a fever?" If Yes, ask: "What is your temperature, how was it measured, and when did it start?"     No  7. RESPIRATORY STATUS: "Describe your breathing?" (e.g., normal; shortness of breath, wheezing, unable to speak)      Shortness of breath  8. BETTER-SAME-WORSE: "Are you getting better, staying the same or getting worse compared to yesterday?"  If getting worse, ask, "In what way?"     Worse today  9. OTHER SYMPTOMS: "Do you have any other symptoms?"  (e.g., chills, fatigue, headache, loss of smell or taste, muscle pain, sore throat)     Slight headache, sore throat, loss taste, poor appetite, cough diarrhea  10. HIGH RISK DISEASE: "Do you have any chronic medical problems?" (e.g., asthma, heart or lung disease, weak immune system, obesity, etc.)       Hx COPD, smoker 11. VACCINE: "Have you had the COVID-19 vaccine?" If Yes, ask: "Which one, how many shots, when did you get  it?"       No  12. PREGNANCY: "Is there any chance you are pregnant?" "When was your last menstrual period?"       na 13. O2 SATURATION MONITOR:  "Do you use an oxygen saturation monitor (pulse oximeter) at home?" If Yes, ask "What is your reading (oxygen level) today?" "What is your usual oxygen saturation reading?" (e.g., 95%)       95 % RA  Protocols used: Coronavirus (COVID-19) Diagnosed or Suspected-A-AH

## 2023-01-31 NOTE — Telephone Encounter (Signed)
Pt has a MyChart Video Visit today at 2:20 pm.

## 2023-01-31 NOTE — Patient Instructions (Addendum)
If you start feeling worse with facial pain, high fever, cough, shortness of breath or start feeling significantly worse, please call us right away to be further evaluated.  Some things that can make you feel better are: - Increased rest - Increasing Fluids - Acetaminophen / ibuprofen as needed for fever/pain.  - Salt water gargling, chloraseptic spray and throat lozenges - Mucinex.  - Saline sinus flushes or a neti pot, otc Flonase - Humidifying the air.  -Try antihistamine such as Claritin or Zyrtec for watery eyes

## 2023-01-31 NOTE — Progress Notes (Signed)
This visit was completed via video due to the restrictions of the COVID-19 pandemic. All issues as above were discussed and addressed but no physical exam was performed. If it was felt that the patient should be evaluated in the office, they were directed there. The patient verbally consented to this visit. Due to the catastrophic nature of the COVID-19 pandemic, this visit was done through video contact only.","This visit was completed via video visit through MyChart due to the restrictions of the COVID-19 pandemic. All issues as above were discussed and addressed. Physical exam was done as above through visual confirmation on video through MyChart. If it was felt that the patient should be evaluated in the office, they were directed there. The patient verbally consented to this visit."} Location of the patient: home Location of the provider: work Those involved with this call:  Provider:  Prescott Gum, NP CMA: Malen Gauze, CMA Front Desk/Registration:  Tarry Kos   Time spent on call:  10 minutes with patient face to face via video conference. More than 50% of this time was spent in counseling and coordination of care. 10 minutes total spent in review of patient's record and preparation of their chart.  SpO2 95% Comment: Patient reported   Subjective:    Patient ID: Beth [name], female    DOB: [birth year], 52 y.o.   MRN: [id number]  HPI: Beth [name] is a 52 y.o. female  Chief Complaint  Patient presents with   Covid Positive   COVID 19 INFECTION Positive COVID test yesterday, symptoms began 2 days ago. Patient interested in starting Paxlovid.  Worst symptom: Runny nose, headache Fever: yes  Cough: yes productive with clear mucus Shortness of breath: yes, using albuterol as needed Wheezing: no Chest pain: no Chest tightness: no Chest congestion: no Nasal congestion: yes Runny nose: yes Post nasal drip: yes Sneezing: yes Sore throat: yes Swollen glands:  no Sinus pressure: no Headache: yes Face pain: no Toothache: no Ear pain: no  Ear pressure: no  Eyes red/itching: No Eye drainage/crusting: Yes, watery Vomiting: no Rash: no Fatigue: yes Sick contacts: yes Aunt whom she is caretaker of has COVID Strep contacts: no  Context: worse Recurrent sinusitis: no Relief with OTC cold/cough medications: yes mild relief.  Treatments attempted: mucinex    Relevant past medical, surgical, family and social history reviewed and updated as indicated. Interim medical history since our last visit reviewed. Allergies and medications reviewed and updated.  Review of Systems  Constitutional:  Positive for fatigue and fever. Negative for chills.  HENT:  Positive for congestion, postnasal drip, rhinorrhea, sneezing and sore throat. Negative for ear pain, sinus pressure and sinus pain.   Eyes:  Negative for discharge, redness and itching.  Respiratory:  Positive for cough. Negative for chest tightness, shortness of breath and wheezing.   Cardiovascular:  Negative for chest pain.  Gastrointestinal:  Negative for vomiting.  Skin:  Negative for rash.  Neurological:  Positive for headaches.    Per HPI unless specifically indicated above     Objective:    SpO2 95% Comment: Patient reported  Wt Readings from Last 3 Encounters:  11/28/22 158 lb 3.2 oz (71.8 kg)  10/17/22 160 lb 12.8 oz (72.9 kg)  09/18/22 150 lb 12.8 oz (68.4 kg)    Physical Exam Constitutional:      Appearance: She is ill-appearing.  HENT:     Nose: Congestion and rhinorrhea present.  Neurological:     Mental Status: She is alert.  Results for orders placed or performed in visit on 11/28/22  Basic metabolic panel  Result Value Ref Range   Glucose 96 70 - 99 mg/dL   BUN 13 6 - 24 mg/dL   Creatinine, Ser 1.02 0.57 - 1.00 mg/dL   eGFR 73 >72 ZD/GUY/4.03   BUN/Creatinine Ratio 14 9 - 23   Sodium 141 134 - 144 mmol/L   Potassium 4.8 3.5 - 5.2 mmol/L   Chloride 101 96  - 106 mmol/L   CO2 25 20 - 29 mmol/L   Calcium 9.5 8.7 - 10.2 mg/dL      Assessment & Plan:   Problem List Items Addressed This Visit     Positive self-administered antigen test for COVID-19 - Primary    Acute,Worsening. Symptoms began 2 days ago, will treat with Paxlovid 5 day course; GFR 73. Will send Tessalon PRN. Continue use of Albuterol as needed if feeling short of breath. Recommend monitoring BP while on Paxlovid and notify if hypotension occurs. Return for worsening symptoms or no improvement.  Recommend the following: -Flonase for nasal congestion -Warm liquids such as hot tea and soup -Plenty of rest and hydration  -Claritin or Zyrtec for watery eyes and congestion        Follow up plan: Return if symptoms worsen or fail to improve.

## 2023-01-31 NOTE — Assessment & Plan Note (Addendum)
Acute,Worsening. Symptoms began 2 days ago, will treat with Paxlovid 5 day course; GFR 73. Will send Tessalon PRN. Continue use of Albuterol as needed if feeling short of breath. Recommend monitoring BP while on Paxlovid and notify if hypotension occurs. Return for worsening symptoms or no improvement.  Recommend the following: -Flonase for nasal congestion -Warm liquids such as hot tea and soup -Plenty of rest and hydration  -Claritin or Zyrtec for watery eyes and congestion

## 2023-03-23 ENCOUNTER — Emergency Department: Payer: Medicaid Other

## 2023-03-23 ENCOUNTER — Inpatient Hospital Stay
Admission: EM | Admit: 2023-03-23 | Discharge: 2023-03-25 | DRG: 871 | Disposition: A | Discharge status: Home / Self Care | Payer: Medicaid Other | Attending: Internal Medicine | Admitting: Internal Medicine

## 2023-03-23 DIAGNOSIS — K219 Gastro-esophageal reflux disease without esophagitis: Secondary | ICD-10-CM | POA: Diagnosis not present

## 2023-03-23 DIAGNOSIS — E872 Acidosis, unspecified: Secondary | ICD-10-CM | POA: Diagnosis not present

## 2023-03-23 DIAGNOSIS — F1021 Alcohol dependence, in remission: Secondary | ICD-10-CM | POA: Diagnosis present

## 2023-03-23 DIAGNOSIS — N170 Acute kidney failure with tubular necrosis: Secondary | ICD-10-CM | POA: Diagnosis not present

## 2023-03-23 DIAGNOSIS — F4323 Adjustment disorder with mixed anxiety and depressed mood: Secondary | ICD-10-CM | POA: Diagnosis present

## 2023-03-23 DIAGNOSIS — Z8249 Family history of ischemic heart disease and other diseases of the circulatory system: Secondary | ICD-10-CM

## 2023-03-23 DIAGNOSIS — Z8616 Personal history of COVID-19: Secondary | ICD-10-CM

## 2023-03-23 DIAGNOSIS — R0609 Other forms of dyspnea: Secondary | ICD-10-CM | POA: Diagnosis not present

## 2023-03-23 DIAGNOSIS — Y900 Blood alcohol level of less than 20 mg/100 ml: Secondary | ICD-10-CM | POA: Diagnosis present

## 2023-03-23 DIAGNOSIS — R0781 Pleurodynia: Secondary | ICD-10-CM | POA: Diagnosis not present

## 2023-03-23 DIAGNOSIS — E860 Dehydration: Secondary | ICD-10-CM | POA: Diagnosis present

## 2023-03-23 DIAGNOSIS — Z79899 Other long term (current) drug therapy: Secondary | ICD-10-CM

## 2023-03-23 DIAGNOSIS — J441 Chronic obstructive pulmonary disease with (acute) exacerbation: Secondary | ICD-10-CM | POA: Diagnosis not present

## 2023-03-23 DIAGNOSIS — Z91040 Latex allergy status: Secondary | ICD-10-CM

## 2023-03-23 DIAGNOSIS — K8020 Calculus of gallbladder without cholecystitis without obstruction: Secondary | ICD-10-CM | POA: Diagnosis not present

## 2023-03-23 DIAGNOSIS — M549 Dorsalgia, unspecified: Secondary | ICD-10-CM | POA: Diagnosis not present

## 2023-03-23 DIAGNOSIS — I959 Hypotension, unspecified: Principal | ICD-10-CM

## 2023-03-23 DIAGNOSIS — E861 Hypovolemia: Secondary | ICD-10-CM | POA: Diagnosis present

## 2023-03-23 DIAGNOSIS — Z880 Allergy status to penicillin: Secondary | ICD-10-CM | POA: Diagnosis not present

## 2023-03-23 DIAGNOSIS — N179 Acute kidney failure, unspecified: Secondary | ICD-10-CM

## 2023-03-23 DIAGNOSIS — Z888 Allergy status to other drugs, medicaments and biological substances status: Secondary | ICD-10-CM

## 2023-03-23 DIAGNOSIS — F17210 Nicotine dependence, cigarettes, uncomplicated: Secondary | ICD-10-CM | POA: Diagnosis present

## 2023-03-23 DIAGNOSIS — E871 Hypo-osmolality and hyponatremia: Secondary | ICD-10-CM | POA: Diagnosis present

## 2023-03-23 DIAGNOSIS — R059 Cough, unspecified: Secondary | ICD-10-CM | POA: Diagnosis not present

## 2023-03-23 DIAGNOSIS — A419 Sepsis, unspecified organism: Secondary | ICD-10-CM | POA: Diagnosis not present

## 2023-03-23 DIAGNOSIS — Z833 Family history of diabetes mellitus: Secondary | ICD-10-CM | POA: Diagnosis not present

## 2023-03-23 DIAGNOSIS — N200 Calculus of kidney: Secondary | ICD-10-CM | POA: Diagnosis not present

## 2023-03-23 DIAGNOSIS — D3502 Benign neoplasm of left adrenal gland: Secondary | ICD-10-CM | POA: Diagnosis not present

## 2023-03-23 DIAGNOSIS — I10 Essential (primary) hypertension: Secondary | ICD-10-CM | POA: Diagnosis not present

## 2023-03-23 LAB — CBC WITH DIFFERENTIAL/PLATELET
Abs Immature Granulocytes: 0.07 10*3/uL (ref 0.00–0.07)
Basophils Absolute: 0.1 10*3/uL (ref 0.0–0.1)
Basophils Relative: 1 %
Eosinophils Absolute: 0.1 10*3/uL (ref 0.0–0.5)
Eosinophils Relative: 0 %
HCT: 46.6 % — ABNORMAL HIGH (ref 36.0–46.0)
Hemoglobin: 16.2 g/dL — ABNORMAL HIGH (ref 12.0–15.0)
Immature Granulocytes: 1 %
Lymphocytes Relative: 28 %
Lymphs Abs: 4.2 10*3/uL — ABNORMAL HIGH (ref 0.7–4.0)
MCH: 30.6 pg (ref 26.0–34.0)
MCHC: 34.8 g/dL (ref 30.0–36.0)
MCV: 88.1 fL (ref 80.0–100.0)
Monocytes Absolute: 1 10*3/uL (ref 0.1–1.0)
Monocytes Relative: 7 %
Neutro Abs: 9.7 10*3/uL — ABNORMAL HIGH (ref 1.7–7.7)
Neutrophils Relative %: 63 %
Platelets: 306 10*3/uL (ref 150–400)
RBC: 5.29 MIL/uL — ABNORMAL HIGH (ref 3.87–5.11)
RDW: 13.1 % (ref 11.5–15.5)
WBC: 15.1 10*3/uL — ABNORMAL HIGH (ref 4.0–10.5)
nRBC: 0 % (ref 0.0–0.2)

## 2023-03-23 LAB — COMPREHENSIVE METABOLIC PANEL
ALT: 93 U/L — ABNORMAL HIGH (ref 0–44)
AST: 31 U/L (ref 15–41)
Albumin: 4.6 g/dL (ref 3.5–5.0)
Alkaline Phosphatase: 86 U/L (ref 38–126)
Anion gap: 20 — ABNORMAL HIGH (ref 5–15)
BUN: 54 mg/dL — ABNORMAL HIGH (ref 6–20)
CO2: 20 mmol/L — ABNORMAL LOW (ref 22–32)
Calcium: 8.5 mg/dL — ABNORMAL LOW (ref 8.9–10.3)
Chloride: 94 mmol/L — ABNORMAL LOW (ref 98–111)
Creatinine, Ser: 4.89 mg/dL — ABNORMAL HIGH (ref 0.44–1.00)
GFR, Estimated: 10 mL/min — ABNORMAL LOW (ref >60)
Glucose, Bld: 92 mg/dL (ref 70–99)
Potassium: 5 mmol/L (ref 3.5–5.1)
Sodium: 134 mmol/L — ABNORMAL LOW (ref 135–145)
Total Bilirubin: 1.6 mg/dL — ABNORMAL HIGH (ref <1.2)
Total Protein: 8.2 g/dL — ABNORMAL HIGH (ref 6.5–8.1)

## 2023-03-23 LAB — LACTIC ACID, PLASMA
Lactic Acid, Venous: 1.3 mmol/L (ref 0.5–1.9)
Lactic Acid, Venous: 1.1 mmol/L (ref 0.5–1.9)

## 2023-03-23 LAB — CK: Total CK: 81 U/L (ref 38–234)

## 2023-03-23 LAB — SARS CORONAVIRUS 2 BY RT PCR: SARS Coronavirus 2 by RT PCR: NEGATIVE

## 2023-03-23 LAB — PROCALCITONIN: Procalcitonin: 0.28 ng/mL

## 2023-03-23 MED ORDER — VANCOMYCIN HCL IN DEXTROSE 1-5 GM/200ML-% IV SOLN
1000.0000 mg | Freq: Once | INTRAVENOUS | Status: DC
  Administered 2023-03-23: 1000 mg via INTRAVENOUS
  Filled 2023-03-23: qty 200

## 2023-03-23 MED ORDER — SODIUM CHLORIDE 0.9 % IV SOLN
2.0000 g | Freq: Once | INTRAVENOUS | Status: AC
  Administered 2023-03-23: 2 g via INTRAVENOUS
  Filled 2023-03-23: qty 12.5

## 2023-03-23 MED ORDER — SODIUM CHLORIDE 0.9 % IV BOLUS
1000.0000 mL | Freq: Once | INTRAVENOUS | Status: AC
  Administered 2023-03-24: 1000 mL via INTRAVENOUS

## 2023-03-23 MED ORDER — VANCOMYCIN HCL 1500 MG/300ML IV SOLN
1500.0000 mg | Freq: Once | INTRAVENOUS | Status: AC
  Administered 2023-03-23: 1500 mg via INTRAVENOUS
  Filled 2023-03-23: qty 300

## 2023-03-23 MED ORDER — HEPARIN SODIUM (PORCINE) 5000 UNIT/ML IJ SOLN
5000.0000 [IU] | Freq: Three times a day (TID) | INTRAMUSCULAR | Status: DC
  Administered 2023-03-24 – 2023-03-25 (×4): 5000 [IU] via SUBCUTANEOUS
  Filled 2023-03-23 (×4): qty 1

## 2023-03-23 MED ORDER — DOCUSATE SODIUM 100 MG PO CAPS: 100.0000 mg | ORAL_CAPSULE | Freq: Two times a day (BID) | ORAL | Status: DC | PRN

## 2023-03-23 MED ORDER — LACTATED RINGERS IV BOLUS
1000.0000 mL | Freq: Once | INTRAVENOUS | Status: AC
  Administered 2023-03-23: 1000 mL via INTRAVENOUS

## 2023-03-23 MED ORDER — ALBUTEROL SULFATE (2.5 MG/3ML) 0.083% IN NEBU
3.0000 mL | INHALATION_SOLUTION | RESPIRATORY_TRACT | Status: DC | PRN
  Administered 2023-03-23: 3 mL via RESPIRATORY_TRACT
  Filled 2023-03-23: qty 3

## 2023-03-23 MED ORDER — SODIUM CHLORIDE 0.9 % IV SOLN: INTRAVENOUS | Status: AC

## 2023-03-23 MED ORDER — POLYETHYLENE GLYCOL 3350 17 G PO PACK: 17.0000 g | PACK | Freq: Every day | ORAL | Status: DC | PRN

## 2023-03-23 MED ORDER — LACTATED RINGERS IV BOLUS (SEPSIS)
250.0000 mL | Freq: Once | INTRAVENOUS | Status: AC
  Administered 2023-03-23: 250 mL via INTRAVENOUS

## 2023-03-23 NOTE — Sepsis Progress Note (Signed)
Elink following code sepsis °

## 2023-03-23 NOTE — Consult Note (Signed)
CODE SEPSIS - PHARMACY COMMUNICATION  **Broad Spectrum Antibiotics should be administered within 1 hour of Sepsis diagnosis**  Time Code Sepsis Called/Page Received: 1955  Antibiotics Ordered: vancomycin, cefepime  Time of 1st antibiotic administration: 2037  Additional action taken by pharmacy: N/A  Celene Squibb, PharmD Clinical Pharmacist 03/23/2023 8:16 PM

## 2023-03-23 NOTE — ED Provider Notes (Signed)
[facility] Provider Note    Event Date/Time   First MD Initiated Contact with Patient 03/23/23 1811     (approximate)   History   Shortness of Breath   HPI  Beth [name] is a 52 y.o. female with a history of hypertension, COPD, GERD, and alcohol abuse who presents with multiple complaints over the last several days.  The patient has increased shortness of breath, generalized weakness and malaise, lightheadedness when walking around, nausea, vomiting, diarrhea, and subjective fever and chills.  She has a mild cough but no URI symptoms.  She denies any dysuria.  She states that she had COVID about 6 weeks ago.  I reviewed the past medical records.  The patient's most recent outpatient counter was on 10/3 with family medicine for evaluation after she was diagnosed with COVID.  She was treated with Paxlovid and albuterol.   Physical Exam   Triage Vital Signs: ED Triage Vitals  Encounter Vitals Group     BP 03/23/23 1631 (!) 133/112     Systolic BP Percentile --      Diastolic BP Percentile --      Pulse Rate 03/23/23 1631 71     Resp 03/23/23 1631 18     Temp 03/23/23 1631 98.1 F (36.7 C)     Temp Source 03/23/23 1631 Oral     SpO2 03/23/23 1631 94 %     Weight --      Height --      Head Circumference --      Peak Flow --      Pain Score 03/23/23 1632 3     Pain Loc --      Pain Education --      Exclude from Growth Chart --     Most recent vital signs: Vitals:   03/23/23 2200 03/23/23 2208  BP:  (!) 88/55  Pulse: 93   Resp:  15  Temp:    SpO2: 98%      General: Alert and oriented, well-appearing, no distress.  CV:  Good peripheral perfusion.  Resp:  Lungs CTAB.  Normal effort.  Abd:  Soft with no focal tenderness.  No distention.  Other:  No peripheral edema.  Dry mucous membranes.   ED Results / Procedures / Treatments   Labs (all labs ordered are listed, but only abnormal results are displayed) Labs Reviewed   COMPREHENSIVE METABOLIC PANEL - Abnormal; Notable for the following components:      Result Value   Sodium 134 (*)    Chloride 94 (*)    CO2 20 (*)    BUN 54 (*)    Creatinine, Ser 4.89 (*)    Calcium 8.5 (*)    Total Protein 8.2 (*)    ALT 93 (*)    Total Bilirubin 1.6 (*)    GFR, Estimated 10 (*)    Anion gap 20 (*)    All other components within normal limits  CBC WITH DIFFERENTIAL/PLATELET - Abnormal; Notable for the following components:   WBC 15.1 (*)    RBC 5.29 (*)    Hemoglobin 16.2 (*)    HCT 46.6 (*)    Neutro Abs 9.7 (*)    Lymphs Abs 4.2 (*)    All other components within normal limits  SARS CORONAVIRUS 2 BY RT PCR  CULTURE, BLOOD (ROUTINE X 2)  CULTURE, BLOOD (ROUTINE X 2)  LACTIC ACID, PLASMA  LACTIC ACID, PLASMA  URINALYSIS, W/ REFLEX TO CULTURE (  INFECTION SUSPECTED)  URINE DRUG SCREEN, QUALITATIVE ([facility] ONLY)  CK     EKG  ED ECG REPORT I, Dionne Bucy, the attending physician, personally viewed and interpreted this ECG.  Date: 03/23/2023 EKG Time: 1635 Rate: 80 Rhythm: normal sinus rhythm QRS Axis: normal Intervals: Borderline QTc ST/T Wave abnormalities: Nonspecific ST abnormalities Narrative Interpretation: Nonspecific abnormalities with no evidence of acute ischemia; no significant change when compared to EKG of 12/14/2014    RADIOLOGY  Chest x-ray: I independently viewed and interpreted the images; there is no focal consolidation or edema  PROCEDURES:  Critical Care performed: Yes  .Critical Care  Performed by: Dionne Bucy, MD Authorized by: Dionne Bucy, MD   Critical care provider statement:    Critical care time (minutes):  40   Critical care time was exclusive of:  Separately billable procedures and treating other patients   Critical care was necessary to treat or prevent imminent or life-threatening deterioration of the following conditions:  Circulatory failure and sepsis   Critical care was time spent  personally by me on the following activities:  Development of treatment plan with patient or surrogate, discussions with consultants, evaluation of patient's response to treatment, examination of patient, ordering and review of laboratory studies, ordering and review of radiographic studies, ordering and performing treatments and interventions, pulse oximetry, re-evaluation of patient's condition, review of old charts and obtaining history from patient or surrogate   Care discussed with: admitting provider      MEDICATIONS ORDERED IN ED: Medications  albuterol (PROVENTIL) (2.5 MG/3ML) 0.083% nebulizer solution 3 mL (3 mLs Inhalation Given 03/23/23 2037)  vancomycin (VANCOREADY) IVPB 1500 mg/300 mL (has no administration in time range)  lactated ringers bolus 1,000 mL (1,000 mLs Intravenous New Bag/Given 03/23/23 1850)  lactated ringers bolus 250 mL (250 mLs Intravenous New Bag/Given 03/23/23 2206)  ceFEPIme (MAXIPIME) 2 g in sodium chloride 0.9 % 100 mL IVPB (0 g Intravenous Stopped 03/23/23 2209)  lactated ringers bolus 1,000 mL (1,000 mLs Intravenous New Bag/Given 03/23/23 2033)     IMPRESSION / MDM / ASSESSMENT AND PLAN / ED COURSE  I reviewed the triage vital signs and the nursing notes.  52 year old female with PMH as noted above presents with shortness of breath, generalized weakness/malaise, lightheadedness, nausea, vomiting, diarrhea.   In triage her vital signs were normal, however after being brought back to the treatment area she was found to be significant hypotensive.  Other vital signs are normal.  Chest x-ray and COVID swab from triage are negative for acute findings.  Differential diagnosis includes, but is not limited to, acute infection/sepsis, possible UTI or GI source, other viral infection, dehydration/hypovolemia, electrolyte abnormality, AKI, other metabolic disturbance.  We will give fluids, obtain additional lab workup, and reassess.  Patient's presentation is most  consistent with acute complicated illness / injury requiring diagnostic workup.  The patient is on the cardiac monitor to evaluate for evidence of arrhythmia and/or significant heart rate changes.  ----------------------------------------- 7:58 PM on 03/23/2023 -----------------------------------------  Blood pressure remains low.  The patient is receiving fluids.  She continues to appear well.  Lactate is normal.  WBC count is elevated.  Creatinine is significantly increased from baseline.  Anion gap is elevated.  I have ordered broad-spectrum antibiotics and additional fluids per the sepsis protocol.  ----------------------------------------- 10:33 PM on 03/23/2023 -----------------------------------------  Blood pressure remains low with a MAP right around 65 even after 2 L of fluid.  However, the patient appears comfortable and is talking on the phone.  Her other vital signs are normal.  Up to now the patient has not required pressors.  However, due to the persistent hypotension I consulted APP Ouma from the ICU and discussed the case with her; she agrees to evaluate the patient for admission.   FINAL CLINICAL IMPRESSION(S) / ED DIAGNOSES   Final diagnoses:  Hypotension, unspecified hypotension type  AKI (acute kidney injury) (HCC)     Rx / DC Orders   ED Discharge Orders     None        Note:  This document was prepared using Dragon voice recognition software and may include unintentional dictation errors.    Dionne Bucy, MD 03/23/23 2234

## 2023-03-23 NOTE — ED Triage Notes (Signed)
Pt c/o R rib and back pain with DOE for 2-3 days. Pt also reports that during that time she's had N/V/D, cough, tactile fevers.

## 2023-03-23 NOTE — ED Notes (Signed)
MD notified of low BP

## 2023-03-23 NOTE — Consult Note (Signed)
ED Pharmacy Antibiotic Sign Off An antibiotic consult was received from an ED provider for vancomycin and cefepime per pharmacy dosing for infection of unknown source. A chart review was completed to assess appropriateness.   The following one time order(s) were placed: cefepime 2g, vancomycin 1500 mg  Further antibiotic and/or antibiotic pharmacy consults should be ordered by the admitting provider if indicated.   Thank you for allowing pharmacy to be a part of this patient's care.   Celene Squibb, PharmD Clinical Pharmacist 03/23/2023 8:16 PM

## 2023-03-23 NOTE — ED Notes (Signed)
Informed RN Casimiro Needle via chat/ Pt has bed assigned (IC05A)

## 2023-03-24 ENCOUNTER — Other Ambulatory Visit: Payer: Self-pay

## 2023-03-24 ENCOUNTER — Encounter: Payer: Self-pay | Admitting: Pulmonary Disease

## 2023-03-24 LAB — URINALYSIS, W/ REFLEX TO CULTURE (INFECTION SUSPECTED)
Bilirubin Urine: NEGATIVE
Glucose, UA: NEGATIVE mg/dL
Ketones, ur: 5 mg/dL — AB
Nitrite: NEGATIVE
Protein, ur: NEGATIVE mg/dL
Specific Gravity, Urine: 1.008 (ref 1.005–1.030)
pH: 5 (ref 5.0–8.0)

## 2023-03-24 LAB — RESPIRATORY PANEL BY PCR

## 2023-03-24 LAB — URINE DRUG SCREEN, QUALITATIVE (ARMC ONLY)
Amphetamines, Ur Screen: NOT DETECTED
Barbiturates, Ur Screen: NOT DETECTED
Benzodiazepine, Ur Scrn: NOT DETECTED
Cannabinoid 50 Ng, Ur ~~LOC~~: POSITIVE — AB
Cocaine Metabolite,Ur ~~LOC~~: NOT DETECTED
MDMA (Ecstasy)Ur Screen: NOT DETECTED
Methadone Scn, Ur: NOT DETECTED
Opiate, Ur Screen: NOT DETECTED
Phencyclidine (PCP) Ur S: NOT DETECTED
Tricyclic, Ur Screen: NOT DETECTED

## 2023-03-24 LAB — BASIC METABOLIC PANEL
Anion gap: 11 (ref 5–15)
BUN: 40 mg/dL — ABNORMAL HIGH (ref 6–20)
CO2: 20 mmol/L — ABNORMAL LOW (ref 22–32)
Calcium: 8.2 mg/dL — ABNORMAL LOW (ref 8.9–10.3)
Chloride: 103 mmol/L (ref 98–111)
Creatinine, Ser: 2.38 mg/dL — ABNORMAL HIGH (ref 0.44–1.00)
GFR, Estimated: 24 mL/min — ABNORMAL LOW (ref >60)
Glucose, Bld: 84 mg/dL (ref 70–99)
Potassium: 3.5 mmol/L (ref 3.5–5.1)
Sodium: 134 mmol/L — ABNORMAL LOW (ref 135–145)

## 2023-03-24 LAB — TSH: TSH: 0.205 u[IU]/mL — ABNORMAL LOW (ref 0.350–4.500)

## 2023-03-24 LAB — CBC
HCT: 36 % (ref 36.0–46.0)
Hemoglobin: 12.7 g/dL (ref 12.0–15.0)
MCH: 31.4 pg (ref 26.0–34.0)
MCHC: 35.3 g/dL (ref 30.0–36.0)
MCV: 89.1 fL (ref 80.0–100.0)
Platelets: 218 10*3/uL (ref 150–400)
RBC: 4.04 MIL/uL (ref 3.87–5.11)
RDW: 13 % (ref 11.5–15.5)
WBC: 10.6 10*3/uL — ABNORMAL HIGH (ref 4.0–10.5)
nRBC: 0 % (ref 0.0–0.2)

## 2023-03-24 LAB — ETHANOL: Alcohol, Ethyl (B): <10 mg/dL (ref <10)

## 2023-03-24 LAB — HIV ANTIBODY (ROUTINE TESTING W REFLEX): HIV Screen 4th Generation wRfx: NONREACTIVE

## 2023-03-24 LAB — T4, FREE: Free T4: 1.18 ng/dL — ABNORMAL HIGH (ref 0.61–1.12)

## 2023-03-24 MED ORDER — CALCIUM CARBONATE 1250 (500 CA) MG PO TABS
1000.0000 mg | ORAL_TABLET | Freq: Once | ORAL | Status: AC
  Administered 2023-03-24: 2500 mg via ORAL
  Filled 2023-03-24: qty 2

## 2023-03-24 MED ORDER — SULFAMETHOXAZOLE-TRIMETHOPRIM 400-80 MG PO TABS
1.0000 | ORAL_TABLET | Freq: Two times a day (BID) | ORAL | Status: DC
  Administered 2023-03-24 – 2023-03-25 (×3): 1 via ORAL
  Filled 2023-03-24 (×3): qty 1

## 2023-03-24 MED ORDER — NICOTINE 7 MG/24HR TD PT24
7.0000 mg | MEDICATED_PATCH | Freq: Every day | TRANSDERMAL | Status: DC
  Administered 2023-03-24 – 2023-03-25 (×2): 7 mg via TRANSDERMAL
  Filled 2023-03-24 (×2): qty 1

## 2023-03-24 MED ORDER — SODIUM CHLORIDE 0.9 % IV SOLN
1.0000 g | INTRAVENOUS | Status: DC
  Filled 2023-03-24: qty 10

## 2023-03-24 NOTE — Progress Notes (Signed)
PHARMACY CONSULT NOTE - FOLLOW UP  Pharmacy Consult for Electrolyte Monitoring and Replacement   Recent Labs: Potassium (mmol/L)  Date Value  03/23/2023 5.0  02/05/2012 4.2   Magnesium (mg/dL)  Date Value  29/51/8841 2.2  02/05/2012 1.7 (L)   Calcium (mg/dL)  Date Value  66/09/3014 8.5 (L)   Calcium, Total (mg/dL)  Date Value  05/08/3233 8.4 (L)   Albumin (g/dL)  Date Value  57/32/2025 4.6  09/18/2022 4.5  02/05/2012 2.9 (L)   Phosphorus (mg/dL)  Date Value  42/70/6237 4.3  01/23/2012 2.4 (L)   Sodium (mmol/L)  Date Value  03/23/2023 134 (L)  11/28/2022 141  02/05/2012 141     Assessment: 11/23 @ 1848:   Ca = 8.5,  Alb = 4.6,  Corrected Ca = 8.02  Goal of Therapy:  Electrolytes WNL   Plan:  - Will order Calcium carbonate (1000 mg elemental Ca) PO X 1 and recheck electrolytes with AM labs.   Scherrie Gerlach ,PharmD Clinical Pharmacist 03/24/2023 1:09 AM

## 2023-03-24 NOTE — Evaluation (Signed)
Physical Therapy Evaluation Patient Details Name: Beth [name] MRN: [id number] DOB: May 29, 1970 Today's Date: 03/24/2023  History of Present Illness  Beth [name] is a 52 y.o. female with a history of hypertension, COPD, GERD, and alcohol abuse who presents with multiple complaints over the last several days.  The patient has increased shortness of breath, generalized weakness and malaise, lightheadedness when walking around, nausea, vomiting, diarrhea, and subjective fever and chills.  She has a mild cough but no URI symptoms.  She denies any dysuria.  She states that she had COVID about 6 weeks ago.  Clinical Impression  Patient is independent with all mobility. Ambulated 300 feet pushing IV pole, but not needing it for support. Reports she is feeling much better. No skilled PT needs at this time.          If plan is discharge home, recommend the following:  N/a   Can travel by private vehicle    yes    Equipment Recommendations None recommended by PT  Recommendations for Other Services       Functional Status Assessment Patient has not had a recent decline in their functional status     Precautions / Restrictions Precautions Precautions: None Restrictions Weight Bearing Restrictions: No      Mobility  Bed Mobility Overal bed mobility: Independent                  Transfers Overall transfer level: Independent                      Ambulation/Gait Ambulation/Gait assistance: Supervision Gait Distance (Feet): 300 Feet Assistive device: IV Pole Gait Pattern/deviations: WFL(Within Functional Limits) Gait velocity: WNL     General Gait Details: supervision only  Stairs            Wheelchair Mobility     Tilt Bed    Modified Rankin (Stroke Patients Only)       Balance Overall balance assessment: Independent                                           Pertinent Vitals/Pain Pain Assessment Pain Assessment:  No/denies pain    Home Living Family/patient expects to be discharged to:: Private residence Living Arrangements: Spouse/significant other Available Help at Discharge: Family;Available 24 hours/day Type of Home: Mobile home Home Access: Ramped entrance       Home Layout: One level Home Equipment: None      Prior Function Prior Level of Function : Independent/Modified Independent             Mobility Comments: patient does not drive, boyfriend provides transport ADLs Comments: independent     Extremity/Trunk Assessment   Upper Extremity Assessment Upper Extremity Assessment: Overall WFL for tasks assessed    Lower Extremity Assessment Lower Extremity Assessment: Overall WFL for tasks assessed    Cervical / Trunk Assessment Cervical / Trunk Assessment: Normal  Communication   Communication Communication: No apparent difficulties Cueing Techniques: Verbal cues  Cognition Arousal: Alert Behavior During Therapy: WFL for tasks assessed/performed Overall Cognitive Status: Within Functional Limits for tasks assessed                                          General Comments  Exercises     Assessment/Plan    PT Assessment Patient does not need any further PT services  PT Problem List         PT Treatment Interventions      PT Goals (Current goals can be found in the Care Plan section)  Acute Rehab PT Goals Patient Stated Goal: return home PT Goal Formulation: With patient Time For Goal Achievement: 03/30/23 Potential to Achieve Goals: Good    Frequency       Co-evaluation               AM-PAC PT "6 Clicks" Mobility  Outcome Measure Help needed turning from your back to your side while in a flat bed without using bedrails?: None Help needed moving from lying on your back to sitting on the side of a flat bed without using bedrails?: None Help needed moving to and from a bed to a chair (including a wheelchair)?: None Help  needed standing up from a chair using your arms (e.g., wheelchair or bedside chair)?: None Help needed to walk in hospital room?: None Help needed climbing 3-5 steps with a railing? : None 6 Click Score: 24    End of Session   Activity Tolerance: Patient tolerated treatment well Patient left: in bed;with call bell/phone within reach;with family/visitor present Nurse Communication: Mobility status      Time: 1000-1008 PT Time Calculation (min) (ACUTE ONLY): 8 min   Charges:   PT Evaluation $PT Eval Low Complexity: 1 Low   PT General Charges $$ ACUTE PT VISIT: 1 Visit         Zaiya Annunziato, PT, GCS 03/24/23,10:25 AM

## 2023-03-24 NOTE — H&P (Addendum)
NAME:  Beth [name], MRN:  [id number], DOB:  03/21/1971, LOS: 1 ADMISSION DATE:  03/23/2023, CONSULTATION DATE: 03/23/2023 REFERRING MD: Miki Kins, CHIEF COMPLAINT: Generalized weakness, shortness of breath  HPI  52 y.o female with significant PMH of COPD, tobacco abuse, EtOH abuse, HTN, and GERD who presented to the ED with chief complaints of SOB, nonproductive cough, generalized weakness and malaise, nausea vomiting, diarrhea, and subjective fevers and chills times several days.  Diagnosed with COVID about 6 weeks ago.   ED Course: Initial vital signs showed HR of beats/minute, BP mm Hg, the RR 30 breaths/minute, and the oxygen saturation % on and a temperature of 98.73F (36.9C).   Pertinent Labs/Diagnostics Findings: Na+/ K+: 130/5.0. BUN/Cr.:  54/4.89 CO2 20, anion gap 20 WBC: 15.1 K/L with neutrophil predominance   PCT: 0.28 Lactic acid: 1.3 COVID PCR: Negative,  CXR> CT Abd/pelvis> Medication administered in the WN:[id number] given 30 cc/kg of fluids and started on broad-spectrum antibiotics Vanco cefepime and Flagyl for suspected sepsis of unknown source  Disposition: PCCM consulted for admission  Past Medical History  COPD, tobacco abuse, EtOH abuse, HTN, and GERD  Significant Hospital Events   11/24:admit with sepsis of unknown source  Consults:  None  Procedures:  None  Significant Diagnostic Tests:  11/24: Chest Xray> IMPRESSION: No active cardiopulmonary disease.  11/24: CTA Chest>  11/24: CT abdomen and pelvis> IMPRESSION: 1. No CT evidence for acute intra-abdominal or pelvic abnormality. 2. Gallstone. 3. Punctate nonobstructing kidney stones.  Interim History / Subjective:      Micro Data:  11/24: SARS-CoV-2 PCR> negative 11/24: Blood culture x2> 11/24: MRSA PCR>>   Antimicrobials:  Vancomycin 11/24 x 1 Cefepime 11/24 x 1 Azithromycin Ceftriaxone  OBJECTIVE  Blood pressure (!) 94/59, pulse 93, temperature 98.1 F (36.7 C),  temperature source Oral, resp. rate 18, height 5\' 3"  (1.6 m), weight 70.2 kg, SpO2 98%.       No intake or output data in the 24 hours ending 03/24/23 0035 Filed Weights   03/23/23 2021  Weight: 70.2 kg   Physical Examination  GENERAL: 52 year-old critically ill patient lying in the bed in no acute distress EYES: PEERLA. No scleral icterus. Extraocular muscles intact.  HEENT: Head atraumatic, normocephalic. Oropharynx and nasopharynx clear.  NECK:  No JVD, supple  LUNGS: Normal breath sounds bilaterally.  No use of accessory muscles of respiration.  CARDIOVASCULAR: S1, S2 normal. No murmurs, rubs, or gallops.  ABDOMEN: Soft, NTND EXTREMITIES: No swelling or erythema.  Capillary refill >3 secs in all extremities. Pulses palpable distally. NEUROLOGIC: The patient is alert and oriented x 4 . No focal neurological deficit appreciated. Cranial nerves are intact.  SKIN: No obvious rash, lesion, or ulcer. Warm to touch Labs/imaging that I havepersonally reviewed  (right click and "Reselect all SmartList Selections" daily)     Labs   CBC: Recent Labs  Lab 03/23/23 1848  WBC 15.1*  NEUTROABS 9.7*  HGB 16.2*  HCT 46.6*  MCV 88.1  PLT 306   Basic Metabolic Panel: Recent Labs  Lab 03/23/23 1848  NA 134*  K 5.0  CL 94*  CO2 20*  GLUCOSE 92  BUN 54*  CREATININE 4.89*  CALCIUM 8.5*   GFR: Estimated Creatinine Clearance: 12.5 mL/min (A) (by C-G formula based on SCr of 4.89 mg/dL (H)). Recent Labs  Lab 03/23/23 1848 03/23/23 1851 03/23/23 2020  PROCALCITON 0.28  --   --   WBC 15.1*  --   --   LATICACIDVEN  --  1.3 1.1    Liver Function Tests: Recent Labs  Lab 03/23/23 1848  AST 31  ALT 93*  ALKPHOS 86  BILITOT 1.6*  PROT 8.2*  ALBUMIN 4.6   No results for input(s): "LIPASE", "AMYLASE" in the last 168 hours. No results for input(s): "AMMONIA" in the last 168 hours.  ABG No results found for: "PHART", "PCO2ART", "PO2ART", "HCO3", "TCO2", "ACIDBASEDEF", "O2SAT"    Coagulation Profile: No results for input(s): "INR", "PROTIME" in the last 168 hours.  Cardiac Enzymes: Recent Labs  Lab 03/23/23 1848  CKTOTAL 81    HbA1C: Hgb A1c MFr Bld  Date/Time Value Ref Range Status  09/20/2017 03:26 PM 5.1 <5.7 % of total Hgb Final    Comment:    For the purpose of screening for the presence of diabetes: . <5.7%       Consistent with the absence of diabetes 5.7-6.4%    Consistent with increased risk for diabetes             (prediabetes) > or =6.5%  Consistent with diabetes . This assay result is consistent with a decreased risk of diabetes. . Currently, no consensus exists regarding use of hemoglobin A1c for diagnosis of diabetes in children. . According to American Diabetes Association (ADA) guidelines, hemoglobin A1c <7.0% represents optimal control in non-pregnant diabetic patients. Different metrics may apply to specific patient populations.  Standards of Medical Care in Diabetes(ADA). .     CBG: No results for input(s): "GLUCAP" in the last 168 hours.  Review of Systems:   Review of Systems  Constitutional:  Positive for chills, fever and malaise/fatigue. Negative for diaphoresis and weight loss.  Eyes: Negative.   Respiratory:  Positive for cough, shortness of breath and wheezing. Negative for hemoptysis and sputum production.   Cardiovascular:  Positive for orthopnea, claudication, leg swelling and PND. Negative for chest pain and palpitations.  Gastrointestinal:  Positive for diarrhea, nausea and vomiting.  Genitourinary:  Positive for flank pain. Negative for dysuria, frequency, hematuria and urgency.  Musculoskeletal:  Positive for myalgias.  Skin: Negative.   Neurological: Negative.   Endo/Heme/Allergies: Negative.   Psychiatric/Behavioral: Negative.     Past Medical History  She,  has a past medical history of Abnormal gamma-glutamyl transferase test, Adjustment disorder with mixed anxiety and depressed mood, Adrenal  nodule (HCC) (10/01/2017), Alcohol abuse, Alcoholic peripheral neuropathy (HCC), Anemia, Arthritis, Asthma, Cardiac murmur, Chronic alcoholic hepatitis, Chronic alcoholism (HCC), Chronic alcoholism in remission [facility]), Chronic back pain, COPD (chronic obstructive pulmonary disease) (HCC), Elevated liver enzymes (Jan 2014), Fatty liver, GERD (gastroesophageal reflux disease), Hiatal hernia, Hypertension, Macrocytosis, Ruptured tubal pregnancy, 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   Surgical History    Past Surgical History:  Procedure Laterality Date   FINGER SURGERY     LAPAROSCOPIC OOPHERECTOMY Right    TUBAL LIGATION  1992     Social History   reports that she has been smoking cigarettes. She has a 25 pack-year smoking history. She has never used smokeless tobacco. She reports current alcohol use. She reports that she does not use drugs.   Family History   Her family history includes Cancer in her brother, brother, and mother; Diabetes in her brother, father, and maternal grandmother; Heart attack in her father; Heart disease in her father; Hypertension in her brother, father, maternal grandfather, maternal grandmother, paternal grandfather, paternal grandmother, and sister; Kidney failure in her father.   Allergies Allergies  Allergen Reactions  Latex    Penicillins Hives   Erythromycin Itching and Rash     Home Medications  Prior to Admission medications   Medication Sig Start Date End Date Taking? Authorizing Provider  albuterol (VENTOLIN HFA) 108 (90 Base) MCG/ACT inhaler Inhale 2 puffs into the lungs every 4 (four) hours as needed for wheezing or shortness of breath. 10/18/20   Danelle Berry, PA-C  amLODipine (NORVASC) 10 MG tablet Take 1 tablet (10 mg total) by mouth daily. 09/18/22   Cannady, Corrie Dandy T, NP  Cholecalciferol (VITAMIN D3) 50 MCG (2000 UT) capsule Take 1 capsule (2,000 Units total) by  mouth daily. 09/19/22   Cannady, Corrie Dandy T, NP  gabapentin (NEURONTIN) 300 MG capsule TAKE 1 CAPSULE BY MOUTH IN THE MORNING, 1 CAPSULE IN THE AFTERNOON, AND 2 CAPSULES AT BEDTIME 09/18/22   Cannady, Jolene T, NP  olmesartan (BENICAR) 20 MG tablet Take 1 tablet (20 mg total) by mouth daily. 11/28/22   Cannady, Corrie Dandy T, NP  omeprazole (PRILOSEC) 10 MG capsule Take 1 capsule (10 mg total) by mouth daily. 07/01/19   Danelle Berry, PA-C  traZODone (DESYREL) 50 MG tablet TAKE 1/2 TO 1 (ONE-HALF TO ONE) TABLET BY MOUTH AT BEDTIME AS NEEDED FOR SLEEP 09/18/22   Aura Dials T, NP  Scheduled Meds:  calcium carbonate  1,000 mg of elemental calcium Oral Once   heparin  5,000 Units Subcutaneous Q8H   Continuous Infusions:  sodium chloride     vancomycin 1,500 mg (03/23/23 2326)   PRN Meds:.albuterol, docusate sodium, polyethylene glycol  Active Hospital Problem list   See systems below  Assessment & Plan:  #Acute Exacerbation of COPD Hx of COPD, tobacco use now presenting with shortness of breath fever and cough CT chest pending, chest xray not consistent with an infectious process. -Supplemental O2 as needed to maintain O2 saturations 88 to 92% -Follow intermittent ABG and chest x-ray as needed -Covid neg, check respiratory panel -As needed bronchodilators -Encourage smoking cessation    #Sepsis of unknown source ?UTI Initial interventions/workup included: 3L of NS/LR & Cefepime/ Vancomycin/  -F/u cultures, trend lactic/ PCT -Monitor WBC/ fever curve -IV antibiotics: Ceftriaxone & Azithromycin -IVF hydration as needed -Pressors for MAP goal >65, not currently requiring pressors -Strict I/O's    #AKI likely ATN in the setting of above #Anion Gap Metabolic Acidosis #Mild Hyponatremia Cr on admission 4.89, Baseline Cr. 0.8 -Monitor I&O's / urinary output -Follow BMP -Ensure adequate renal perfusion -Avoid nephrotoxic agents as able -Replace electrolytes as indicated      Best practice:   Diet:  Oral Pain/Anxiety/Delirium protocol (if indicated): No VAP protocol (if indicated): Not indicated DVT prophylaxis: Subcutaneous Heparin GI prophylaxis: PPI Glucose control:  SSI No Central venous access:  N/A Arterial line:  N/A Foley:  N/A Mobility:  bed rest  PT consulted: N/A Last date of multidisciplinary goals of care discussion [11/24] Code Status:  full code Disposition: MEDSURG   = Goals of Care = Code Status Order: FULL  Primary Emergency Contact: Allison,Mark Wishes to pursue full aggressive treatment and intervention options, including CPR and intubation, but goals of care will be addressed on going with family if that should become necessary.  Critical care time: 45 minutes        Webb Silversmith DNP, CCRN, FNP-C, AGACNP-BC Acute Care & Family Nurse Practitioner [place] [facility] PCCM on call pager [phone]

## 2023-03-24 NOTE — Progress Notes (Signed)
NAME:  Beth [name], MRN:  [id number], DOB:  07-19-1970, LOS: 1 ADMISSION DATE:  03/23/2023, CONSULTATION DATE: 03/23/2023 REFERRING MD: Miki Kins, CHIEF COMPLAINT: Generalized weakness, shortness of breath  HPI  52 y.o female with significant PMH of COPD, tobacco abuse, EtOH abuse, HTN, and GERD who presented to the ED with chief complaints of SOB, nonproductive cough, generalized weakness and malaise, nausea vomiting, diarrhea, and subjective fevers and chills times several days.  Diagnosed with COVID about 6 weeks ago.   ED Course: Initial vital signs showed HR of beats/minute, BP mm Hg, the RR 30 breaths/minute, and the oxygen saturation % on and a temperature of 98.86F (36.9C).   Disposition: PCCM consulted for admission  Past Medical History  COPD, tobacco abuse, EtOH abuse, HTN, and GERD  Significant Hospital Events   11/24:admit with sepsis of unknown source  Consults:  None  Procedures:  None  Significant Diagnostic Tests:  11/24: Chest Xray> IMPRESSION: No active cardiopulmonary disease.  11/24: CTA Chest>  11/24: CT abdomen and pelvis> IMPRESSION: 1. No CT evidence for acute intra-abdominal or pelvic abnormality. 2. Gallstone. 3. Punctate nonobstructing kidney stones.  Interim History / Subjective:      Micro Data:  11/24: SARS-CoV-2 PCR> negative 11/24: Blood culture x2> 11/24: MRSA PCR>>   Antimicrobials:  Vancomycin 11/24 x 1 Cefepime 11/24 x 1 Azithromycin Ceftriaxone  OBJECTIVE  Blood pressure (!) 96/59, pulse (!) 59, temperature 98.6 F (37 C), temperature source Oral, resp. rate 18, height 5' 2.99" (1.6 m), weight 69.1 kg, SpO2 99%.       No intake or output data in the 24 hours ending 03/24/23 0825 Filed Weights   03/23/23 2021 03/24/23 0156  Weight: 70.2 kg 69.1 kg   Physical Examination  GENERAL: 52 year-old critically ill patient lying in the bed in no acute distress EYES: PEERLA. No scleral icterus. Extraocular  muscles intact.  HEENT: Head atraumatic, normocephalic. Oropharynx and nasopharynx clear.  NECK:  No JVD, supple  LUNGS: Normal breath sounds bilaterally.  No use of accessory muscles of respiration.  CARDIOVASCULAR: S1, S2 normal. No murmurs, rubs, or gallops.  ABDOMEN: Soft, NTND EXTREMITIES: No swelling or erythema.  Capillary refill >3 secs in all extremities. Pulses palpable distally. NEUROLOGIC: The patient is alert and oriented x 4 . No focal neurological deficit appreciated. Cranial nerves are intact.  SKIN: No obvious rash, lesion, or ulcer. Warm to touch Labs/imaging that I havepersonally reviewed  (right click and "Reselect all SmartList Selections" daily)     Labs   CBC: Recent Labs  Lab 03/23/23 1848 03/24/23 0451  WBC 15.1* 10.6*  NEUTROABS 9.7*  --   HGB 16.2* 12.7  HCT 46.6* 36.0  MCV 88.1 89.1  PLT 306 218   Basic Metabolic Panel: Recent Labs  Lab 03/23/23 1848 03/24/23 0451  NA 134* 134*  K 5.0 3.5  CL 94* 103  CO2 20* 20*  GLUCOSE 92 84  BUN 54* 40*  CREATININE 4.89* 2.38*  CALCIUM 8.5* 8.2*   GFR: Estimated Creatinine Clearance: 25.5 mL/min (A) (by C-G formula based on SCr of 2.38 mg/dL (H)). Recent Labs  Lab 03/23/23 1848 03/23/23 1851 03/23/23 2020 03/24/23 0451  PROCALCITON 0.28  --   --   --   WBC 15.1*  --   --  10.6*  LATICACIDVEN  --  1.3 1.1  --     Liver Function Tests: Recent Labs  Lab 03/23/23 1848  AST 31  ALT 93*  ALKPHOS 86  BILITOT 1.6*  PROT 8.2*  ALBUMIN 4.6   No results for input(s): "LIPASE", "AMYLASE" in the last 168 hours. No results for input(s): "AMMONIA" in the last 168 hours.  ABG No results found for: "PHART", "PCO2ART", "PO2ART", "HCO3", "TCO2", "ACIDBASEDEF", "O2SAT"   Coagulation Profile: No results for input(s): "INR", "PROTIME" in the last 168 hours.  Cardiac Enzymes: Recent Labs  Lab 03/23/23 1848  CKTOTAL 81    HbA1C: Hgb A1c MFr Bld  Date/Time Value Ref Range Status  09/20/2017  03:26 PM 5.1 <5.7 % of total Hgb Final    Comment:    For the purpose of screening for the presence of diabetes: . <5.7%       Consistent with the absence of diabetes 5.7-6.4%    Consistent with increased risk for diabetes             (prediabetes) > or =6.5%  Consistent with diabetes . This assay result is consistent with a decreased risk of diabetes. . Currently, no consensus exists regarding use of hemoglobin A1c for diagnosis of diabetes in children. . According to American Diabetes Association (ADA) guidelines, hemoglobin A1c <7.0% represents optimal control in non-pregnant diabetic patients. Different metrics may apply to specific patient populations.  Standards of Medical Care in Diabetes(ADA). .     CBG: No results for input(s): "GLUCAP" in the last 168 hours.  Review of Systems:    10 point ROS done and is negative except mild SOB with exertion.  Patient walking around on room air.   Past Medical History  She,  has a past medical history of Abnormal gamma-glutamyl transferase test, Adjustment disorder with mixed anxiety and depressed mood, Adrenal nodule (HCC) (10/01/2017), Alcohol abuse, Alcoholic peripheral neuropathy (HCC), Anemia, Arthritis, Asthma, Cardiac murmur, Chronic alcoholic hepatitis, Chronic alcoholism (HCC), Chronic alcoholism in remission [facility]), Chronic back pain, COPD (chronic obstructive pulmonary disease) (HCC), Elevated liver enzymes (Jan 2014), Fatty liver, GERD (gastroesophageal reflux disease), Hiatal hernia, Hypertension, Macrocytosis, Ruptured tubal pregnancy, 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   Surgical History    Past Surgical History:  Procedure Laterality Date   FINGER SURGERY     LAPAROSCOPIC OOPHERECTOMY Right    TUBAL LIGATION  1992     Social History   reports that she has been smoking cigarettes. She has a 25 pack-year smoking history. She has  never used smokeless tobacco. She reports current alcohol use. She reports that she does not use drugs.   Family History   Her family history includes Cancer in her brother, brother, and mother; Diabetes in her brother, father, and maternal grandmother; Heart attack in her father; Heart disease in her father; Hypertension in her brother, father, maternal grandfather, maternal grandmother, paternal grandfather, paternal grandmother, and sister; Kidney failure in her father.   Allergies Allergies  Allergen Reactions   Latex    Penicillins Hives   Erythromycin Itching and Rash     Home Medications  Prior to Admission medications   Medication Sig Start Date End Date Taking? Authorizing Provider  albuterol (VENTOLIN HFA) 108 (90 Base) MCG/ACT inhaler Inhale 2 puffs into the lungs every 4 (four) hours as needed for wheezing or shortness of breath. 10/18/20   Danelle Berry, PA-C  amLODipine (NORVASC) 10 MG tablet Take 1 tablet (10 mg total) by mouth daily. 09/18/22   Cannady, Corrie Dandy T, NP  Cholecalciferol (VITAMIN D3) 50 MCG (2000 UT) capsule Take 1 capsule (2,000 Units total) by mouth daily.  09/19/22   Cannady, Corrie Dandy T, NP  gabapentin (NEURONTIN) 300 MG capsule TAKE 1 CAPSULE BY MOUTH IN THE MORNING, 1 CAPSULE IN THE AFTERNOON, AND 2 CAPSULES AT BEDTIME 09/18/22   Cannady, Jolene T, NP  olmesartan (BENICAR) 20 MG tablet Take 1 tablet (20 mg total) by mouth daily. 11/28/22   Cannady, Corrie Dandy T, NP  omeprazole (PRILOSEC) 10 MG capsule Take 1 capsule (10 mg total) by mouth daily. 07/01/19   Danelle Berry, PA-C  traZODone (DESYREL) 50 MG tablet TAKE 1/2 TO 1 (ONE-HALF TO ONE) TABLET BY MOUTH AT BEDTIME AS NEEDED FOR SLEEP 09/18/22   Aura Dials T, NP  Scheduled Meds:  heparin  5,000 Units Subcutaneous Q8H   Continuous Infusions:  sodium chloride 125 mL/hr at 03/24/23 0525   cefTRIAXone (ROCEPHIN)  IV     PRN Meds:.albuterol, docusate sodium, polyethylene glycol  Imaging  See systems  below    Assessment & Plan:  #Acute Exacerbation of COPD Hx of COPD, tobacco use now presenting with shortness of breath fever and cough CT chest pending, chest xray not consistent with an infectious process. -Supplemental O2 as needed to maintain O2 saturations 88 to 92% -Follow intermittent ABG and chest x-ray as needed -Covid neg, check respiratory panel -As needed bronchodilators -Encourage smoking cessation    #Sepsis of unknown source ?UTI Initial interventions/workup included: 3L of NS/LR & Cefepime/ Vancomycin/  -F/u cultures, trend lactic/ PCT -Monitor WBC/ fever curve -IV antibiotics: Ceftriaxone & Azithromycin -IVF hydration as needed -Pressors for MAP goal >65, not currently requiring pressors -Strict I/O's    #AKI likely ATN in the setting of above #Anion Gap Metabolic Acidosis #Mild Hyponatremia Cr on admission 4.89, Baseline Cr. 0.8 -Monitor I&O's / urinary output -Follow BMP -Ensure adequate renal perfusion -Avoid nephrotoxic agents as able -Replace electrolytes as indicated      Best practice:  Diet:  Oral Pain/Anxiety/Delirium protocol (if indicated): No VAP protocol (if indicated): Not indicated DVT prophylaxis: Subcutaneous Heparin GI prophylaxis: PPI Glucose control:  SSI No Central venous access:  N/A Arterial line:  N/A Foley:  N/A Mobility:  bed rest  PT consulted: N/A Last date of multidisciplinary goals of care discussion [11/24] Code Status:  full code Disposition: MEDSURG   = Goals of Care = Code Status Order: FULL  Primary Emergency Contact: Allison,Mark Wishes to pursue full aggressive treatment and intervention options, including CPR and intubation, but goals of care will be addressed on going with family if that should become necessary.     Vida Rigger, M.D.  Pulmonary & Critical Care Medicine  [facility]

## 2023-03-25 DIAGNOSIS — A419 Sepsis, unspecified organism: Secondary | ICD-10-CM | POA: Diagnosis not present

## 2023-03-25 DIAGNOSIS — I959 Hypotension, unspecified: Secondary | ICD-10-CM | POA: Diagnosis not present

## 2023-03-25 DIAGNOSIS — N179 Acute kidney failure, unspecified: Secondary | ICD-10-CM | POA: Diagnosis not present

## 2023-03-25 LAB — CBC
HCT: 43 % (ref 36.0–46.0)
Hemoglobin: 14.2 g/dL (ref 12.0–15.0)
MCH: 30.6 pg (ref 26.0–34.0)
MCHC: 33 g/dL (ref 30.0–36.0)
MCV: 92.7 fL (ref 80.0–100.0)
Platelets: 236 10*3/uL (ref 150–400)
RBC: 4.64 MIL/uL (ref 3.87–5.11)
RDW: 13.2 % (ref 11.5–15.5)
WBC: 6.9 10*3/uL (ref 4.0–10.5)
nRBC: 0 % (ref 0.0–0.2)

## 2023-03-25 LAB — BASIC METABOLIC PANEL
Anion gap: 10 (ref 5–15)
BUN: 14 mg/dL (ref 6–20)
CO2: 16 mmol/L — ABNORMAL LOW (ref 22–32)
Calcium: 8.8 mg/dL — ABNORMAL LOW (ref 8.9–10.3)
Chloride: 112 mmol/L — ABNORMAL HIGH (ref 98–111)
Creatinine, Ser: 1.06 mg/dL — ABNORMAL HIGH (ref 0.44–1.00)
GFR, Estimated: >60 mL/min (ref >60)
Glucose, Bld: 70 mg/dL (ref 70–99)
Potassium: 4.6 mmol/L (ref 3.5–5.1)
Sodium: 138 mmol/L (ref 135–145)

## 2023-03-25 NOTE — Plan of Care (Signed)
  Problem: Education: Goal: Knowledge of General Education information will improve Description Including pain rating scale, medication(s)/side effects and non-pharmacologic comfort measures Outcome: Progressing   

## 2023-03-25 NOTE — Discharge Summary (Signed)
Physician Discharge Summary   Patient: Beth [name] MRN: [id number] DOB: 1970/10/17  Admit date:     03/23/2023  Discharge date: 03/25/23  Discharge Physician: Delfino Lovett   PCP: Marjie Skiff, NP   Recommendations at discharge:   Follow-up with outpatient providers as requested Please recheck thyroid function in 1 to 2 weeks to decide need for treatment for possible hyper thyroidism  Discharge Diagnoses: Principal Problem:   Sepsis with hypotension (HCC) Active Problems:   Hypotension   AKI (acute kidney injury) [name])  Hospital Course: Assessment and Plan:  52 y.o female with significant PMH of COPD, tobacco abuse, EtOH abuse, HTN, and GERD who presented to the ED with chief complaints of SOB, nonproductive cough, generalized weakness and malaise, nausea vomiting, diarrhea, and subjective fevers and chills times several days. Diagnosed with COVID about 6 weeks ago   #Acute Exacerbation of COPD Hx of COPD, tobacco use now presenting with shortness of breath fever and cough CT chest pending, chest xray not consistent with an infectious process. -Supplemental O2 as needed to maintain O2 saturations 88 to 92% -Follow intermittent ABG and chest x-ray as needed -Covid neg, and respiratory panel negative -Back to baseline.  Good response to nebulizers and breathing treatment.  She declines any further need for steroids    #Sepsis of unknown source  No obvious source of infection.  Sepsis ruled out Patient is not interested in continuing any further antibiotics Her low blood pressure was likely due to dehydration from nausea vomiting and diarrhea which has resolved now    #AKI likely ATN in the setting of above #Anion Gap Metabolic Acidosis #Mild Hyponatremia  Lab Results  Component Value Date   CREATININE 1.06 (H) 03/25/2023   CREATININE 2.38 (H) 03/24/2023   CREATININE 4.89 (H) 03/23/2023  Much improved.  She feels back to baseline   Abnormal thyroid function Low TSH  and high free T4 Recommend close follow-up with PCP in next 1 to 2 weeks with recheck of thyroid function and consideration for endocrine evaluation for possible hyperthyroidism and treatment Patient prefers to hold off any new medication and discussed with her PCP       Disposition: Home Diet recommendation:  Discharge Diet Orders (From admission, onward)     Start     Ordered   03/25/23 0000  Diet - low sodium heart healthy        03/25/23 0927           Carb modified diet DISCHARGE MEDICATION: Allergies as of 03/25/2023       Reactions   Latex    Penicillins Hives   Erythromycin Itching, Rash        Medication List     TAKE these medications    albuterol 108 (90 Base) MCG/ACT inhaler Commonly known as: VENTOLIN HFA Inhale 2 puffs into the lungs every 4 (four) hours as needed for wheezing or shortness of breath.   amLODipine 10 MG tablet Commonly known as: NORVASC Take 1 tablet (10 mg total) by mouth daily.   gabapentin 300 MG capsule Commonly known as: NEURONTIN TAKE 1 CAPSULE BY MOUTH IN THE MORNING, 1 CAPSULE IN THE AFTERNOON, AND 2 CAPSULES AT BEDTIME   olmesartan 20 MG tablet Commonly known as: BENICAR Take 1 tablet (20 mg total) by mouth daily.   omeprazole 10 MG capsule Commonly known as: PRILOSEC Take 1 capsule (10 mg total) by mouth daily.   traZODone 50 MG tablet Commonly known as: DESYREL TAKE 1/2 TO  1 (ONE-HALF TO ONE) TABLET BY MOUTH AT BEDTIME AS NEEDED FOR SLEEP   Vitamin D3 50 MCG (2000 UT) capsule Take 1 capsule (2,000 Units total) by mouth daily.        Follow-up Information     Aura Dials T, NP Follow up on 03/27/2023.   Specialty: Nurse Practitioner Why: [facility] Discharge F/UP. Go at 2:20pm please arrive 15 minutes to check in. Contact information: [address] [place] [address] [phone]                Discharge Exam: Ceasar Mons Weights   03/23/23 2021 03/24/23 0156  Weight: 70.2 kg 21.55 kg    52 year old female lying in the bed comfortably without any acute distress Lungs clear to auscultation bilaterally Heart regular rate and rhythm Abdomen soft, benign Neuro alert and awake Skin no rash or lesion  Condition at discharge: good  The results of significant diagnostics from this hospitalization (including imaging, microbiology, ancillary and laboratory) are listed below for reference.   Imaging Studies: CT ABDOMEN PELVIS WO CONTRAST  Result Date: 03/23/2023 CLINICAL DATA:  Dyspnea on exertion nausea vomiting diarrhea EXAM: CT ABDOMEN AND PELVIS WITHOUT CONTRAST TECHNIQUE: Multidetector CT imaging of the abdomen and pelvis was performed following the standard protocol without IV contrast. RADIATION DOSE REDUCTION: This exam was performed according to the departmental dose-optimization program which includes automated exposure control, adjustment of the mA and/or kV according to patient size and/or use of iterative reconstruction technique. COMPARISON:  CT 10/02/2018 FINDINGS: Lower chest: Lung bases demonstrate no acute airspace disease. Hepatobiliary: Gallstone. No biliary dilatation. No focal hepatic abnormality. Pancreas: Unremarkable. No pancreatic ductal dilatation or surrounding inflammatory changes. Spleen: Normal in size without focal abnormality. 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 Stable 1.9 cm left adrenal adenoma. No imaging follow-up is recommended. Kidneys show no hydronephrosis. Punctate nonobstructing kidney stones. The bladder is normal Stomach/Bowel: The stomach is nonenlarged. There is no dilated small bowel. No acute bowel wall thickening. Vascular/Lymphatic: Advanced aortic atherosclerosis. No aneurysm. No suspicious lymph nodes. Reproductive: Uterus is unremarkable.  No suspicious adnexal mass Other: Negative for pelvic effusion or free air Musculoskeletal: No acute osseous abnormality IMPRESSION: 1. No CT evidence for acute  intra-abdominal or pelvic abnormality. 2. Gallstone. 3. Punctate nonobstructing kidney stones. Aortic Atherosclerosis (ICD10-I70.0). Electronically Signed   By: Jasmine Pang M.D.   On: 03/23/2023 20:52   DG Chest 2 View  Result Date: 03/23/2023 CLINICAL DATA:  Cough and dyspnea on exertion for the past 2-3 days. Right rib and back pain. EXAM: CHEST - 2 VIEW COMPARISON:  Chest x-ray dated April 25, 2021. FINDINGS: The heart size and mediastinal contours are within normal limits. Both lungs are clear. The visualized skeletal structures are unremarkable. IMPRESSION: No active cardiopulmonary disease. Electronically Signed   By: Obie Dredge M.D.   On: 03/23/2023 17:11    Microbiology: Results for orders placed or performed during the hospital encounter of 03/23/23  SARS Coronavirus 2 by RT PCR (hospital order, performed in [facility] hospital lab) *cepheid single result test* Anterior Nasal Swab     Status: None   Collection Time: 03/23/23  4:28 PM   Specimen: Anterior Nasal Swab  Result Value Ref Range Status   SARS Coronavirus 2 by RT PCR NEGATIVE NEGATIVE Final    Comment: (NOTE) SARS-CoV-2 target nucleic acids are NOT DETECTED.  The SARS-CoV-2 RNA is generally detectable in upper and lower respiratory specimens during the acute phase of infection. The lowest  concentration of SARS-CoV-2 viral copies this assay can detect is 250 copies / mL. A negative result does not preclude SARS-CoV-2 infection and should not be used as the sole basis for treatment or other patient management decisions.  A negative result may occur with improper specimen collection / handling, submission of specimen other than nasopharyngeal swab, presence of viral mutation(s) within the areas targeted by this assay, and inadequate number of viral copies (<250 copies / mL). A negative result must be combined with clinical observations, patient history, and epidemiological information.  Fact Sheet for Patients:    RoadLapTop.co.za  Fact Sheet for Healthcare Providers: http://kim-miller.com/  This test is not yet approved or  cleared by the [place] FDA and has been authorized for detection and/or diagnosis of SARS-CoV-2 by FDA under an Emergency Use Authorization (EUA).  This EUA will remain in effect (meaning this test can be used) for the duration of the COVID-19 declaration under Section 564(b)(1) of the Act, 21 U.S.C. section 360bbb-3(b)(1), unless the authorization is terminated or revoked sooner.  Performed at [facility], [address] [place], [place] [address]   Blood Culture (routine x 2)     Status: None (Preliminary result)   Collection Time: 03/23/23  8:19 PM   Specimen: BLOOD  Result Value Ref Range Status   Specimen Description BLOOD BLOOD RIGHT ARM  Final   Special Requests   Final    BOTTLES DRAWN AEROBIC AND ANAEROBIC Blood Culture adequate volume   Culture   Final    NO GROWTH 2 DAYS Performed at [facility], [address], [place], [place] [address]    Report Status PENDING  Incomplete  Blood Culture (routine x 2)     Status: None (Preliminary result)   Collection Time: 03/23/23  8:32 PM   Specimen: BLOOD  Result Value Ref Range Status   Specimen Description BLOOD BLOOD RIGHT ARM  Final   Special Requests   Final    BOTTLES DRAWN AEROBIC AND ANAEROBIC Blood Culture adequate volume   Culture   Final    NO GROWTH 2 DAYS Performed at [facility], [address] [place], [place] [address]    Report Status PENDING  Incomplete  Respiratory (~20 pathogens) panel by PCR     Status: None   Collection Time: 03/24/23  2:50 AM   Specimen: Nasopharyngeal Swab; Respiratory  Result Value Ref Range Status   Adenovirus NOT DETECTED NOT DETECTED Final   Coronavirus 229E NOT DETECTED NOT DETECTED Final    Comment: (NOTE) The Coronavirus on the Respiratory Panel, DOES NOT test for the novel   Coronavirus (2019 nCoV)    Coronavirus HKU1 NOT DETECTED NOT DETECTED Final   Coronavirus NL63 NOT DETECTED NOT DETECTED Final   Coronavirus OC43 NOT DETECTED NOT DETECTED Final   Metapneumovirus NOT DETECTED NOT DETECTED Final   Rhinovirus / Enterovirus NOT DETECTED NOT DETECTED Final   Influenza A NOT DETECTED NOT DETECTED Final   Influenza B NOT DETECTED NOT DETECTED Final   Parainfluenza Virus 1 NOT DETECTED NOT DETECTED Final   Parainfluenza Virus 2 NOT DETECTED NOT DETECTED Final   Parainfluenza Virus 3 NOT DETECTED NOT DETECTED Final   Parainfluenza Virus 4 NOT DETECTED NOT DETECTED Final   Respiratory Syncytial Virus NOT DETECTED NOT DETECTED Final   Bordetella pertussis NOT DETECTED NOT DETECTED Final   Bordetella Parapertussis NOT DETECTED NOT DETECTED Final   Chlamydophila pneumoniae NOT DETECTED NOT DETECTED Final   Mycoplasma pneumoniae NOT DETECTED  NOT DETECTED Final    Comment: Performed at [facility], [address] [place], [place] [address]    Labs: CBC: Recent Labs  Lab 03/23/23 1848 03/24/23 0451 03/25/23 0641  WBC 15.1* 10.6* 6.9  NEUTROABS 9.7*  --   --   HGB 16.2* 12.7 14.2  HCT 46.6* 36.0 43.0  MCV 88.1 89.1 92.7  PLT 306 218 236   Basic Metabolic Panel: Recent Labs  Lab 03/23/23 1848 03/24/23 0451 03/25/23 0641  NA 134* 134* 138  K 5.0 3.5 4.6  CL 94* 103 112*  CO2 20* 20* 16*  GLUCOSE 92 84 70  BUN 54* 40* 14  CREATININE 4.89* 2.38* 1.06*  CALCIUM 8.5* 8.2* 8.8*   Liver Function Tests: Recent Labs  Lab 03/23/23 1848  AST 31  ALT 93*  ALKPHOS 86  BILITOT 1.6*  PROT 8.2*  ALBUMIN 4.6   CBG: No results for input(s): "GLUCAP" in the last 168 hours.  Discharge time spent: greater than 30 minutes.  Signed: Delfino Lovett, MD [facility] 03/25/2023

## 2023-03-25 NOTE — Progress Notes (Signed)
Transition of Care [facility]) - Inpatient Brief Assessment   Patient Details  Name: Beth [name] MRN: [id number] Date of Birth: 1970/08/09  Transition of [facility]) CM/SW Contact:    Truddie Hidden, RN Phone Number: 03/25/2023, 9:52 AM   Clinical Narrative: TOC continuing to follow patient's progress throughout discharge planning.   Transition of Care Asessment: Insurance and Status: Insurance coverage has been reviewed Patient has primary care physician: Yes Home environment has been reviewed: Home Prior level of function:: independent Prior/Current Home Services: No current home services Social Determinants of Health Reivew: SDOH reviewed no interventions necessary Readmission risk has been reviewed: Yes Transition of care needs: no transition of care needs at this time

## 2023-03-27 ENCOUNTER — Ambulatory Visit (INDEPENDENT_AMBULATORY_CARE_PROVIDER_SITE_OTHER): Payer: Medicaid Other | Admitting: Nurse Practitioner

## 2023-03-27 ENCOUNTER — Encounter: Payer: Self-pay | Admitting: Nurse Practitioner

## 2023-03-27 VITALS — BP 100/67 | HR 83 | Ht 62.0 in | Wt 148.4 lb

## 2023-03-27 DIAGNOSIS — E861 Hypovolemia: Secondary | ICD-10-CM

## 2023-03-27 DIAGNOSIS — R7989 Other specified abnormal findings of blood chemistry: Secondary | ICD-10-CM

## 2023-03-27 DIAGNOSIS — K219 Gastro-esophageal reflux disease without esophagitis: Secondary | ICD-10-CM | POA: Diagnosis not present

## 2023-03-27 DIAGNOSIS — I959 Hypotension, unspecified: Secondary | ICD-10-CM | POA: Diagnosis not present

## 2023-03-27 DIAGNOSIS — F17210 Nicotine dependence, cigarettes, uncomplicated: Secondary | ICD-10-CM

## 2023-03-27 DIAGNOSIS — N179 Acute kidney failure, unspecified: Secondary | ICD-10-CM

## 2023-03-27 DIAGNOSIS — A419 Sepsis, unspecified organism: Secondary | ICD-10-CM

## 2023-03-27 DIAGNOSIS — F1010 Alcohol abuse, uncomplicated: Secondary | ICD-10-CM | POA: Diagnosis not present

## 2023-03-27 DIAGNOSIS — I10 Essential (primary) hypertension: Secondary | ICD-10-CM

## 2023-03-27 MED ORDER — VITAMIN D3 50 MCG (2000 UT) PO CAPS: 2000.0000 [IU] | ORAL_CAPSULE | Freq: Every day | ORAL | 4 refills | Status: AC

## 2023-03-27 MED ORDER — OMEPRAZOLE 10 MG PO CPDR: 10.0000 mg | DELAYED_RELEASE_CAPSULE | Freq: Every day | ORAL | 4 refills | Status: DC

## 2023-03-27 NOTE — Assessment & Plan Note (Signed)
Chronic, stable.  Continue Omeprazole and consider reduction in future.  Risks of PPI use were discussed with patient including bone loss, C. Diff diarrhea, pneumonia, infections, CKD, electrolyte abnormalities.  Verbalizes understanding and chooses to continue the medication.

## 2023-03-27 NOTE — Assessment & Plan Note (Signed)
Chronic, improved.  BP on lower side.  Will reduce Amlodipine to 2.5 MG daily and continue Olmesartan 20 MG daily.  Educated her on changes and she was able to verbalize back. Discussed kidney protective element of ARB family.  Would avoid ACE due to her underlying COPD.  Recommend she monitor BP at least a few mornings a week at home and document.  DASH diet at home.  Continue current medication regimen and adjust as needed.  Labs today: CBC and CMP.

## 2023-03-27 NOTE — Assessment & Plan Note (Signed)
I have recommended complete cessation of tobacco use. I have discussed various options available for assistance with tobacco cessation including over the counter methods (Nicotine gum, patch and lozenges). We also discussed prescription options (Chantix, Nicotine Inhaler / Nasal Spray). The patient is not interested in pursuing any prescription tobacco cessation options at this time.  Referral for lung cancer screening placed last visit,  has not attended yet.

## 2023-03-27 NOTE — Patient Instructions (Signed)
Eating Plan for Chronic Obstructive Pulmonary Disease Chronic obstructive pulmonary disease (COPD) causes symptoms such as shortness of breath, coughing, and chest discomfort. These symptoms can make it difficult to eat enough to maintain a healthy weight. Generally, people with COPD should eat a diet that is high in calories, protein, and other nutrients to maintain body weight and to keep the lungs as healthy as possible. Depending on the medicines you take and other health conditions you may have, your health care provider may give you additional recommendations on what to eat or avoid. Talk with your health care provider about your goals for body weight, and work with a dietitian to develop an eating plan that is right for you. What are tips for following this plan? Reading food labels  Avoid foods with more than 300 milligrams (mg) of salt (sodium) per serving. Choose foods that contain at least 4 grams (g) of fiber per serving. Try to eat 20-30 g of fiber each day. Choose foods that are high in calories and protein, such as nuts, beans, yogurt, and cheese. Shopping Do not buy foods labeled as diet, low-calorie, or low-fat. If you are able to eat dairy products: Avoid low-fat or skim milk. Buy dairy products that have at least 2% fat. Buy nutritional supplement drinks. Buy grains and prepared foods labeled as enriched or fortified. Consider buying low-sodium, pre-made foods to conserve energy for eating. Cooking Add dry milk or protein powder to smoothies. Cook with healthy fats, such as olive oil, canola oil, sunflower oil, and grapeseed oil. Add oil, butter, cream cheese, or nut butters to foods to increase fat and calories. To make foods easier to chew and swallow: Cook vegetables, pasta, and rice until soft. Cut or grind meat into very small pieces. Dip breads in liquid. Meal planning  Eat when you feel hungry. Eat 5-6 small meals throughout the day. Drink 6-8 glasses of water  each day. Do not drink liquids with meals. Drink liquids at the end of the meal to avoid feeling full too quickly. Eat a variety of fruits and vegetables every day. Ask for assistance from family or friends with planning and preparing meals as needed. Avoid foods that cause you to feel bloated, such as carbonated drinks, fried foods, beans, broccoli, cabbage, and apples. For older adults, ask your local agency on aging whether you are eligible for meal assistance programs, such as Meals on Wheels. Lifestyle  Do not smoke. Eat slowly. Take small bites and chew food well before swallowing. Do not overeat. This may make it more difficult to breathe after eating. Sit up while eating. If needed, continue to use supplemental oxygen while eating. Rest or relax for 30 minutes before and after eating. Monitor your weight as told by your health care provider. Exercise as told by your health care provider. What foods should I eat? Fruits All fresh, dried, canned, or frozen fruits that do not cause gas. Vegetables All fresh, canned (no salt added), or frozen vegetables that do not cause gas. Grains Whole-grain bread. Enriched whole-grain pasta. Fortified whole-grain cereals. Fortified rice. Quinoa. Meats and other proteins Lean meat. Poultry. Fish. Dried beans. Unsalted nuts. Tofu. Eggs. Nut butters. Dairy Whole or 2% milk. Cheese. Yogurt. Fats and oils Olive oil. Canola oil. Butter. Margarine. Beverages Water. Vegetable juice (no salt added). Decaffeinated coffee. Decaffeinated or herbal tea. Seasonings and condiments Fresh or dried herbs. Low-salt or salt-free seasonings. Low-sodium soy sauce. The items listed above may not be a complete list of foods  and beverages you can eat. Contact a dietitian for more information. What foods should I avoid? Fruits Fruits that cause gas, such as apples or melon. Vegetables Vegetables that cause gas, such as broccoli, Brussels sprouts, cabbage,  cauliflower, and onions. Canned vegetables with added salt. Meats and other proteins Fried meat. Salt-cured meat. Processed meat. Dairy Fat-free or low-fat milk, yogurt, or cheese. Processed cheese. Beverages Carbonated drinks. Caffeinated drinks, such as coffee, tea, and soft drinks. Juice. Alcohol. Vegetable juice with added salt. Seasonings and condiments Salt. Seasoning mixes with salt. Soy sauce. Rosita Fire. Other foods Clear soup or broth. Fried foods. Prepared frozen meals. The items listed above may not be a complete list of foods and beverages you should avoid. Contact a dietitian for more information. Summary COPD symptoms can make it difficult to eat enough to maintain a healthy weight. A COPD eating plan can help you maintain your body weight and keep your lungs as healthy as possible. Eat a diet that is high in calories, protein, and other nutrients. Read labels to make sure that you are getting the right nutrients. Cook foods to make them easier to chew and swallow. Eat 5-6 small meals throughout the day, and avoid foods that cause gas or make you feel bloated. This information is not intended to replace advice given to you by your health care provider. Make sure you discuss any questions you have with your health care provider. Document Revised: 02/23/2020 Document Reviewed: 02/23/2020 Elsevier Patient Education  2024 [employer].

## 2023-03-27 NOTE — Assessment & Plan Note (Signed)
Acute and now improved.  She reports overall feeling better with just mild fatigue.  Recheck labs today.

## 2023-03-27 NOTE — Assessment & Plan Note (Signed)
Continues to abstain from alcohol use -- 6 months now.  Recommend continued cessation for overall health.  Reports supportive family.  Refuses AA or rehab.  Monitor closely.

## 2023-03-27 NOTE — Assessment & Plan Note (Signed)
Acute and treated in hospital.  Labs were improving on discharge.  Will recheck today.  She is feeling better at this time, recommend to ensure good hydration daily.

## 2023-03-27 NOTE — Assessment & Plan Note (Signed)
Ongoing.  Continue Magnesium 400 MG every night and check level today.

## 2023-03-27 NOTE — Progress Notes (Signed)
BP 100/67   Pulse 83   Ht 5\' 2"  (1.575 m)   Wt 148 lb 6.4 oz (67.3 kg)   SpO2 99%   BMI 27.14 kg/m    Subjective:    Patient ID: Beth [name], female    DOB: 03/04/1971, 52 y.o.   MRN: [id number]  HPI: Beth [name] is a 52 y.o. female  Chief Complaint  Patient presents with   Hospitalization Follow-up   Hypotension   Transition of Care Hospital Follow up.  Admitted to hospital 03/23/23, went in due to severe back pain.  She was in severe pain.  They did work-up and she was found to be septic and had a COPD exacerbation.  She was noted to have low TSH in hospital too.  No alcohol use in 6 months.  Continues to smoke, last week she has cut back to 2-3 packs during week.  CXR was reassuring and CT abdomen noted gallstones and non obstructing kidney stones.  Her NA+ was low, but upon discharge was in normal range. Had AKI noted, upon discharge this was improving.    She currently reports feeling better, just sluggish and tired.  Is getting good fluid intake, appetite is slowly improving. She is taking Amlodipine and Olmesartan for BP -- had low BP in hospital and still remains low.  "Hospital Course: Assessment and Plan:   52 y.o female with significant PMH of COPD, tobacco abuse, EtOH abuse, HTN, and GERD who presented to the ED with chief complaints of SOB, nonproductive cough, generalized weakness and malaise, nausea vomiting, diarrhea, and subjective fevers and chills times several days. Diagnosed with COVID about 6 weeks ago    #Acute Exacerbation of COPD Hx of COPD, tobacco use now presenting with shortness of breath fever and cough CT chest pending, chest xray not consistent with an infectious process. -Supplemental O2 as needed to maintain O2 saturations 88 to 92% -Follow intermittent ABG and chest x-ray as needed -Covid neg, and respiratory panel negative -Back to baseline.  Good response to nebulizers and breathing treatment.  She declines any further need for  steroids    #Sepsis of unknown source  No obvious source of infection.  Sepsis ruled out Patient is not interested in continuing any further antibiotics Her low blood pressure was likely due to dehydration from nausea vomiting and diarrhea which has resolved now    #AKI likely ATN in the setting of above #Anion Gap Metabolic Acidosis #Mild Hyponatremia   Recent Labs       Lab Results  Component Value Date    CREATININE 1.06 (H) 03/25/2023    CREATININE 2.38 (H) 03/24/2023    CREATININE 4.89 (H) 03/23/2023    Much improved.  She feels back to baseline   Abnormal thyroid function Low TSH and high free T4 Recommend close follow-up with PCP in next 1 to 2 weeks with recheck of thyroid function and consideration for endocrine evaluation for possible hyperthyroidism and treatment Patient prefers to hold off any new medication and discussed with her PCP    Hospital/Facility: [facility] D/C Physician: Dr. Sherryll Burger D/C Date: 03/25/23  Records Requested: 03/27/23 Records Received: 03/27/23 Records Reviewed: 03/27/23  Diagnoses on Discharge: Sepsis with hypotension  Date of interactive Contact within 48 hours of discharge:  Contact was through: none  Date of 7 day or 14 day face-to-face visit:    within 7 days  Outpatient Encounter Medications as of 03/27/2023  Medication Sig   albuterol (VENTOLIN HFA) 108 (90 Base)  MCG/ACT inhaler Inhale 2 puffs into the lungs every 4 (four) hours as needed for wheezing or shortness of breath.   amLODipine (NORVASC) 10 MG tablet Take 1 tablet (10 mg total) by mouth daily.   gabapentin (NEURONTIN) 300 MG capsule TAKE 1 CAPSULE BY MOUTH IN THE MORNING, 1 CAPSULE IN THE AFTERNOON, AND 2 CAPSULES AT BEDTIME   olmesartan (BENICAR) 20 MG tablet Take 1 tablet (20 mg total) by mouth daily.   traZODone (DESYREL) 50 MG tablet TAKE 1/2 TO 1 (ONE-HALF TO ONE) TABLET BY MOUTH AT BEDTIME AS NEEDED FOR SLEEP   Cholecalciferol (VITAMIN D3) 50 MCG (2000 UT) capsule Take  1 capsule (2,000 Units total) by mouth daily.   omeprazole (PRILOSEC) 10 MG capsule Take 1 capsule (10 mg total) by mouth daily.   [DISCONTINUED] Cholecalciferol (VITAMIN D3) 50 MCG (2000 UT) capsule Take 1 capsule (2,000 Units total) by mouth daily. (Patient not taking: Reported on 03/27/2023)   [DISCONTINUED] omeprazole (PRILOSEC) 10 MG capsule Take 1 capsule (10 mg total) by mouth daily. (Patient not taking: Reported on 03/27/2023)   No facility-administered encounter medications on file as of 03/27/2023.   Diagnostic Tests Reviewed/Disposition:     Latest Ref Rng & Units 03/25/2023    6:41 AM 03/24/2023    4:51 AM 03/23/2023    6:48 PM  CBC  WBC 4.0 - 10.5 K/uL 6.9  10.6  15.1   Hemoglobin 12.0 - 15.0 g/dL 40.9  81.1  91.4   Hematocrit 36.0 - 46.0 % 43.0  36.0  46.6   Platelets 150 - 400 K/uL 236  218  306        Latest Ref Rng & Units 03/25/2023    6:41 AM 03/24/2023    4:51 AM 03/23/2023    6:48 PM  CMP  Glucose 70 - 99 mg/dL 70  84  92   BUN 6 - 20 mg/dL 14  40  54   Creatinine 0.44 - 1.00 mg/dL 7.82  9.56  2.13   Sodium 135 - 145 mmol/L 138  134  134   Potassium 3.5 - 5.1 mmol/L 4.6  3.5  5.0   Chloride 98 - 111 mmol/L 112  103  94   CO2 22 - 32 mmol/L 16  20  20    Calcium 8.9 - 10.3 mg/dL 8.8  8.2  8.5   Total Protein 6.5 - 8.1 g/dL   8.2   Total Bilirubin <1.2 mg/dL   1.6   Alkaline Phos 38 - 126 U/L   86   AST 15 - 41 U/L   31   ALT 0 - 44 U/L   93     Consults: Hospitalist  Discharge Instructions: Follow-up with outpatient providers as requested Please recheck thyroid function in 1 to 2 weeks to decide need for treatment for possible hyper thyroidism  Disease/illness Education: Discussed with patient and educated her.  Home Health/Community Services Discussions/Referrals: none  Establishment or re-establishment of referral orders for community resources: none  Discussion with other health care providers: Reviewed all recent notes  Assessment and  Support of treatment regimen adherence:Discussed with patient and educated her.  Appointments Coordinated with: Reviewed with patient  Education for self-management, independent living, and ADLs:  Discussed with patient and educated her.  Relevant past medical, surgical, family and social history reviewed and updated as indicated. Interim medical history since our last visit reviewed. Allergies and medications reviewed and updated.  Review of Systems  Constitutional:  Positive for fatigue. Negative  for activity change, appetite change, diaphoresis and fever.  Respiratory:  Negative for cough, chest tightness, shortness of breath and wheezing.   Cardiovascular:  Negative for chest pain, palpitations and leg swelling.  Gastrointestinal: Negative.   Neurological: Negative.   Psychiatric/Behavioral: Negative.     Per HPI unless specifically indicated above     Objective:    BP 100/67   Pulse 83   Ht 5\' 2"  (1.575 m)   Wt 148 lb 6.4 oz (67.3 kg)   SpO2 99%   BMI 27.14 kg/m   Wt Readings from Last 3 Encounters:  03/27/23 148 lb 6.4 oz (67.3 kg)  03/24/23 152 lb 5.4 oz (69.1 kg)  11/28/22 158 lb 3.2 oz (71.8 kg)    Physical Exam Vitals and nursing note reviewed.  Constitutional:      General: She is awake. She is not in acute distress.    Appearance: She is well-developed and well-groomed. She is not ill-appearing or toxic-appearing.  HENT:     Head: Normocephalic.     Right Ear: Hearing and external ear normal.     Left Ear: Hearing and external ear normal.  Eyes:     General: Lids are normal.        Right eye: No discharge.        Left eye: No discharge.     Conjunctiva/sclera: Conjunctivae normal.     Pupils: Pupils are equal, round, and reactive to light.  Neck:     Thyroid: No thyromegaly.     Vascular: No carotid bruit.  Cardiovascular:     Rate and Rhythm: Normal rate and regular rhythm.     Heart sounds: Normal heart sounds. No murmur heard.    No gallop.   Pulmonary:     Effort: Pulmonary effort is normal. No accessory muscle usage or respiratory distress.     Breath sounds: Normal breath sounds.  Abdominal:     General: Bowel sounds are normal. There is no distension.     Palpations: Abdomen is soft.     Tenderness: There is no abdominal tenderness.  Musculoskeletal:     Cervical back: Normal range of motion and neck supple.     Right lower leg: No edema.     Left lower leg: No edema.  Lymphadenopathy:     Cervical: No cervical adenopathy.  Skin:    General: Skin is warm and dry.  Neurological:     Mental Status: She is alert and oriented to person, place, and time.     Deep Tendon Reflexes: Reflexes are normal and symmetric.     Reflex Scores:      Brachioradialis reflexes are 2+ on the right side and 2+ on the left side.      Patellar reflexes are 2+ on the right side and 2+ on the left side. Psychiatric:        Attention and Perception: Attention normal.        Mood and Affect: Mood normal.        Speech: Speech normal.        Behavior: Behavior normal. Behavior is cooperative.        Thought Content: Thought content normal.    Results for orders placed or performed during the hospital encounter of 03/23/23  SARS Coronavirus 2 by RT PCR (hospital order, performed in [facility] hospital lab) *cepheid single result test* Anterior Nasal Swab   Specimen: Anterior Nasal Swab  Result Value Ref Range   SARS Coronavirus 2 by  RT PCR NEGATIVE NEGATIVE  Blood Culture (routine x 2)   Specimen: BLOOD  Result Value Ref Range   Specimen Description BLOOD BLOOD RIGHT ARM    Special Requests      BOTTLES DRAWN AEROBIC AND ANAEROBIC Blood Culture adequate volume   Culture      NO GROWTH 4 DAYS Performed at [facility], [address] [place], [place] [address]    Report Status PENDING   Blood Culture (routine x 2)   Specimen: BLOOD  Result Value Ref Range   Specimen Description BLOOD BLOOD RIGHT ARM    Special Requests       BOTTLES DRAWN AEROBIC AND ANAEROBIC Blood Culture adequate volume   Culture      NO GROWTH 4 DAYS Performed at [facility], [address] [place], [place] [address]    Report Status PENDING   Respiratory (~20 pathogens) panel by PCR   Specimen: Nasopharyngeal Swab; Respiratory  Result Value Ref Range   Adenovirus NOT DETECTED NOT DETECTED   Coronavirus 229E NOT DETECTED NOT DETECTED   Coronavirus HKU1 NOT DETECTED NOT DETECTED   Coronavirus NL63 NOT DETECTED NOT DETECTED   Coronavirus OC43 NOT DETECTED NOT DETECTED   Metapneumovirus NOT DETECTED NOT DETECTED   Rhinovirus / Enterovirus NOT DETECTED NOT DETECTED   Influenza A NOT DETECTED NOT DETECTED   Influenza B NOT DETECTED NOT DETECTED   Parainfluenza Virus 1 NOT DETECTED NOT DETECTED   Parainfluenza Virus 2 NOT DETECTED NOT DETECTED   Parainfluenza Virus 3 NOT DETECTED NOT DETECTED   Parainfluenza Virus 4 NOT DETECTED NOT DETECTED   Respiratory Syncytial Virus NOT DETECTED NOT DETECTED   Bordetella pertussis NOT DETECTED NOT DETECTED   Bordetella Parapertussis NOT DETECTED NOT DETECTED   Chlamydophila pneumoniae NOT DETECTED NOT DETECTED   Mycoplasma pneumoniae NOT DETECTED NOT DETECTED  Lactic acid, plasma  Result Value Ref Range   Lactic Acid, Venous 1.3 0.5 - 1.9 mmol/L  Lactic acid, plasma  Result Value Ref Range   Lactic Acid, Venous 1.1 0.5 - 1.9 mmol/L  Comprehensive metabolic panel  Result Value Ref Range   Sodium 134 (L) 135 - 145 mmol/L   Potassium 5.0 3.5 - 5.1 mmol/L   Chloride 94 (L) 98 - 111 mmol/L   CO2 20 (L) 22 - 32 mmol/L   Glucose, Bld 92 70 - 99 mg/dL   BUN 54 (H) 6 - 20 mg/dL   Creatinine, Ser 2.95 (H) 0.44 - 1.00 mg/dL   Calcium 8.5 (L) 8.9 - 10.3 mg/dL   Total Protein 8.2 (H) 6.5 - 8.1 g/dL   Albumin 4.6 3.5 - 5.0 g/dL   AST 31 15 - 41 U/L   ALT 93 (H) 0 - 44 U/L   Alkaline Phosphatase 86 38 - 126 U/L   Total Bilirubin 1.6 (H) <1.2 mg/dL   GFR, Estimated 10 (L) >60 mL/min    Anion gap 20 (H) 5 - 15  CBC with Differential  Result Value Ref Range   WBC 15.1 (H) 4.0 - 10.5 K/uL   RBC 5.29 (H) 3.87 - 5.11 MIL/uL   Hemoglobin 16.2 (H) 12.0 - 15.0 g/dL   HCT 62.1 (H) 30.8 - 65.7 %   MCV 88.1 80.0 - 100.0 fL   MCH 30.6 26.0 - 34.0 pg   MCHC 34.8 30.0 - 36.0 g/dL   RDW 84.6 96.2 - 95.2 %   Platelets 306 150 - 400 K/uL   nRBC 0.0 0.0 - 0.2 %  Neutrophils Relative % 63 %   Neutro Abs 9.7 (H) 1.7 - 7.7 K/uL   Lymphocytes Relative 28 %   Lymphs Abs 4.2 (H) 0.7 - 4.0 K/uL   Monocytes Relative 7 %   Monocytes Absolute 1.0 0.1 - 1.0 K/uL   Eosinophils Relative 0 %   Eosinophils Absolute 0.1 0.0 - 0.5 K/uL   Basophils Relative 1 %   Basophils Absolute 0.1 0.0 - 0.1 K/uL   Immature Granulocytes 1 %   Abs Immature Granulocytes 0.07 0.00 - 0.07 K/uL  Urinalysis, w/ Reflex to Culture (Infection Suspected) -Urine, Clean Catch  Result Value Ref Range   Specimen Source URINE, CLEAN CATCH    Color, Urine YELLOW (A) YELLOW   APPearance HAZY (A) CLEAR   Specific Gravity, Urine 1.008 1.005 - 1.030   pH 5.0 5.0 - 8.0   Glucose, UA NEGATIVE NEGATIVE mg/dL   Hgb urine dipstick SMALL (A) NEGATIVE   Bilirubin Urine NEGATIVE NEGATIVE   Ketones, ur 5 (A) NEGATIVE mg/dL   Protein, ur NEGATIVE NEGATIVE mg/dL   Nitrite NEGATIVE NEGATIVE   Leukocytes,Ua TRACE (A) NEGATIVE   RBC / HPF 0-5 0 - 5 RBC/hpf   WBC, UA 0-5 0 - 5 WBC/hpf   Bacteria, UA RARE (A) NONE SEEN   Squamous Epithelial / HPF 0-5 0 - 5 /HPF   Mucus PRESENT    Hyaline Casts, UA PRESENT   Urine Drug Screen, Qualitative ([facility] only)  Result Value Ref Range   Tricyclic, Ur Screen NONE DETECTED NONE DETECTED   Amphetamines, Ur Screen NONE DETECTED NONE DETECTED   MDMA (Ecstasy)Ur Screen NONE DETECTED NONE DETECTED   Cocaine Metabolite,Ur [place] NONE DETECTED NONE DETECTED   Opiate, Ur Screen NONE DETECTED NONE DETECTED   Phencyclidine (PCP) Ur S NONE DETECTED NONE DETECTED   Cannabinoid 50 Ng, Ur [place] POSITIVE (A)  NONE DETECTED   Barbiturates, Ur Screen NONE DETECTED NONE DETECTED   Benzodiazepine, Ur Scrn NONE DETECTED NONE DETECTED   Methadone Scn, Ur NONE DETECTED NONE DETECTED  CK  Result Value Ref Range   Total CK 81 38 - 234 U/L  Procalcitonin  Result Value Ref Range   Procalcitonin 0.28 ng/mL  HIV Antibody (routine testing w rflx)  Result Value Ref Range   HIV Screen 4th Generation wRfx Non Reactive Non Reactive  Basic metabolic panel  Result Value Ref Range   Sodium 134 (L) 135 - 145 mmol/L   Potassium 3.5 3.5 - 5.1 mmol/L   Chloride 103 98 - 111 mmol/L   CO2 20 (L) 22 - 32 mmol/L   Glucose, Bld 84 70 - 99 mg/dL   BUN 40 (H) 6 - 20 mg/dL   Creatinine, Ser 1.61 (H) 0.44 - 1.00 mg/dL   Calcium 8.2 (L) 8.9 - 10.3 mg/dL   GFR, Estimated 24 (L) >60 mL/min   Anion gap 11 5 - 15  TSH  Result Value Ref Range   TSH 0.205 (L) 0.350 - 4.500 uIU/mL  T4, free  Result Value Ref Range   Free T4 1.18 (H) 0.61 - 1.12 ng/dL  Ethanol  Result Value Ref Range   Alcohol, Ethyl (B) <10 <10 mg/dL  CBC  Result Value Ref Range   WBC 10.6 (H) 4.0 - 10.5 K/uL   RBC 4.04 3.87 - 5.11 MIL/uL   Hemoglobin 12.7 12.0 - 15.0 g/dL   HCT 09.6 04.5 - 40.9 %   MCV 89.1 80.0 - 100.0 fL   MCH 31.4 26.0 - 34.0 pg  MCHC 35.3 30.0 - 36.0 g/dL   RDW 16.6 06.3 - 01.6 %   Platelets 218 150 - 400 K/uL   nRBC 0.0 0.0 - 0.2 %  CBC  Result Value Ref Range   WBC 6.9 4.0 - 10.5 K/uL   RBC 4.64 3.87 - 5.11 MIL/uL   Hemoglobin 14.2 12.0 - 15.0 g/dL   HCT 01.0 93.2 - 35.5 %   MCV 92.7 80.0 - 100.0 fL   MCH 30.6 26.0 - 34.0 pg   MCHC 33.0 30.0 - 36.0 g/dL   RDW 73.2 20.2 - 54.2 %   Platelets 236 150 - 400 K/uL   nRBC 0.0 0.0 - 0.2 %  Basic metabolic panel  Result Value Ref Range   Sodium 138 135 - 145 mmol/L   Potassium 4.6 3.5 - 5.1 mmol/L   Chloride 112 (H) 98 - 111 mmol/L   CO2 16 (L) 22 - 32 mmol/L   Glucose, Bld 70 70 - 99 mg/dL   BUN 14 6 - 20 mg/dL   Creatinine, Ser 7.06 (H) 0.44 - 1.00 mg/dL    Calcium 8.8 (L) 8.9 - 10.3 mg/dL   GFR, Estimated >23 >76 mL/min   Anion gap 10 5 - 15      Assessment & Plan:   Problem List Items Addressed This Visit       Cardiovascular and Mediastinum   Hypertension (Chronic)    Chronic, improved.  BP on lower side.  Will reduce Amlodipine to 2.5 MG daily and continue Olmesartan 20 MG daily.  Educated her on changes and she was able to verbalize back. Discussed kidney protective element of ARB family.  Would avoid ACE due to her underlying COPD.  Recommend she monitor BP at least a few mornings a week at home and document.  DASH diet at home.  Continue current medication regimen and adjust as needed.  Labs today: CBC and CMP.         Hypotension    Noted while in hospital and still running lower today.  Recommend she ensure good hydration and she is to cut Amlodipine in 1/2 to 2.5 MG daily.  Plan on return in 3 weeks and if still on lower side will further reduce.      Relevant Orders   CBC with Differential/Platelet   Comprehensive metabolic panel   Sepsis with hypotension (HCC) - Primary    Acute and now improved.  She reports overall feeling better with just mild fatigue.  Recheck labs today.      Relevant Orders   CBC with Differential/Platelet   Comprehensive metabolic panel     Digestive   Gastroesophageal reflux disease without esophagitis    Chronic, stable.  Continue Omeprazole and consider reduction in future.  Risks of PPI use were discussed with patient including bone loss, C. Diff diarrhea, pneumonia, infections, CKD, electrolyte abnormalities.  Verbalizes understanding and chooses to continue the medication.       Relevant Medications   omeprazole (PRILOSEC) 10 MG capsule     Genitourinary   AKI (acute kidney injury) (HCC)    Acute and treated in hospital.  Labs were improving on discharge.  Will recheck today.  She is feeling better at this time, recommend to ensure good hydration daily.      Relevant Orders   CBC  with Differential/Platelet   Comprehensive metabolic panel     Other   Alcohol abuse    Continues to abstain from alcohol use -- 6 months now.  Recommend continued cessation for overall health.  Reports supportive family.  Refuses AA or rehab.  Monitor closely.       Hypomagnesemia    Ongoing.  Continue Magnesium 400 MG every night and check level today.      Relevant Orders   Magnesium   Nicotine dependence, cigarettes, uncomplicated    I have recommended complete cessation of tobacco use. I have discussed various options available for assistance with tobacco cessation including over the counter methods (Nicotine gum, patch and lozenges). We also discussed prescription options (Chantix, Nicotine Inhaler / Nasal Spray). The patient is not interested in pursuing any prescription tobacco cessation options at this time.  Referral for lung cancer screening placed last visit,  has not attended yet.       Other Visit Diagnoses     Low TSH level       Noted in hospital, recheck today, if ongoing abnormal levels consider ultrasound of thyroid.   Relevant Orders   T4, free   TSH        Follow up plan: Return in about 3 weeks (around 04/17/2023) for HTN and COPD.

## 2023-03-27 NOTE — Assessment & Plan Note (Signed)
Noted while in hospital and still running lower today.  Recommend she ensure good hydration and she is to cut Amlodipine in 1/2 to 2.5 MG daily.  Plan on return in 3 weeks and if still on lower side will further reduce.

## 2023-03-28 LAB — CULTURE, BLOOD (ROUTINE X 2)
Culture: NO GROWTH
Culture: NO GROWTH
Special Requests: ADEQUATE
Special Requests: ADEQUATE

## 2023-03-28 LAB — CBC WITH DIFFERENTIAL/PLATELET
Basophils Absolute: 0.1 10*3/uL (ref 0.0–0.2)
Basos: 1 %
EOS (ABSOLUTE): 0.1 10*3/uL (ref 0.0–0.4)
Eos: 1 %
Hematocrit: 47.1 % — ABNORMAL HIGH (ref 34.0–46.6)
Hemoglobin: 16 g/dL — ABNORMAL HIGH (ref 11.1–15.9)
Immature Grans (Abs): 0 10*3/uL (ref 0.0–0.1)
Immature Granulocytes: 0 %
Lymphocytes Absolute: 4 10*3/uL — ABNORMAL HIGH (ref 0.7–3.1)
Lymphs: 36 %
MCH: 30.9 pg (ref 26.6–33.0)
MCHC: 34 g/dL (ref 31.5–35.7)
MCV: 91 fL (ref 79–97)
Monocytes Absolute: 0.6 10*3/uL (ref 0.1–0.9)
Monocytes: 5 %
Neutrophils Absolute: 6.3 10*3/uL (ref 1.4–7.0)
Neutrophils: 57 %
Platelets: 239 10*3/uL (ref 150–450)
RBC: 5.18 x10E6/uL (ref 3.77–5.28)
RDW: 12.2 % (ref 11.7–15.4)
WBC: 11.1 10*3/uL — ABNORMAL HIGH (ref 3.4–10.8)

## 2023-03-28 LAB — TSH: TSH: 0.528 u[IU]/mL (ref 0.450–4.500)

## 2023-03-28 LAB — COMPREHENSIVE METABOLIC PANEL
ALT: 30 [IU]/L (ref 0–32)
AST: 19 [IU]/L (ref 0–40)
Albumin: 4.5 g/dL (ref 3.8–4.9)
Alkaline Phosphatase: 96 [IU]/L (ref 44–121)
BUN/Creatinine Ratio: 13 (ref 9–23)
BUN: 13 mg/dL (ref 6–24)
Bilirubin Total: 0.2 mg/dL (ref 0.0–1.2)
CO2: 17 mmol/L — ABNORMAL LOW (ref 20–29)
Calcium: 9.6 mg/dL (ref 8.7–10.2)
Chloride: 104 mmol/L (ref 96–106)
Creatinine, Ser: 0.99 mg/dL (ref 0.57–1.00)
Globulin, Total: 2.6 g/dL (ref 1.5–4.5)
Glucose: 91 mg/dL (ref 70–99)
Potassium: 4.5 mmol/L (ref 3.5–5.2)
Sodium: 140 mmol/L (ref 134–144)
Total Protein: 7.1 g/dL (ref 6.0–8.5)
eGFR: 68 mL/min/{1.73_m2} (ref >59)

## 2023-03-28 LAB — T4, FREE: Free T4: 1.46 ng/dL (ref 0.82–1.77)

## 2023-03-28 LAB — MAGNESIUM: Magnesium: 1.8 mg/dL (ref 1.6–2.3)

## 2023-03-30 NOTE — Progress Notes (Signed)
Contacted via MyChart   Good morning Beth [name] your labs have returned: - Kidney function, creatinine and eGFR, remains normal, as is liver function, AST and ALT.  - CBC shows mild elevation in white blood cell count and lymphocytes + hemoglobin and hematocrit are elevated -- we will recheck these at December visit.  Any questions? Keep being stellar!!  Thank you for allowing me to participate in your care.  I appreciate you. Kindest regards, Kyannah Climer

## 2023-04-13 NOTE — Patient Instructions (Incomplete)

## 2023-04-15 ENCOUNTER — Encounter: Payer: Self-pay | Admitting: Nurse Practitioner

## 2023-04-15 ENCOUNTER — Ambulatory Visit: Payer: Medicaid Other | Admitting: Nurse Practitioner

## 2023-04-15 VITALS — BP 101/65 | Temp 98.1°F | Resp 13 | Wt 154.6 lb

## 2023-04-15 DIAGNOSIS — Z23 Encounter for immunization: Secondary | ICD-10-CM

## 2023-04-15 DIAGNOSIS — M4804 Spinal stenosis, thoracic region: Secondary | ICD-10-CM | POA: Diagnosis not present

## 2023-04-15 DIAGNOSIS — Z1231 Encounter for screening mammogram for malignant neoplasm of breast: Secondary | ICD-10-CM

## 2023-04-15 DIAGNOSIS — R011 Cardiac murmur, unspecified: Secondary | ICD-10-CM | POA: Diagnosis not present

## 2023-04-15 DIAGNOSIS — J449 Chronic obstructive pulmonary disease, unspecified: Secondary | ICD-10-CM

## 2023-04-15 DIAGNOSIS — D582 Other hemoglobinopathies: Secondary | ICD-10-CM

## 2023-04-15 DIAGNOSIS — F17210 Nicotine dependence, cigarettes, uncomplicated: Secondary | ICD-10-CM

## 2023-04-15 DIAGNOSIS — I952 Hypotension due to drugs: Secondary | ICD-10-CM | POA: Diagnosis not present

## 2023-04-15 DIAGNOSIS — I10 Essential (primary) hypertension: Secondary | ICD-10-CM

## 2023-04-15 MED ORDER — LIDOCAINE 5 % EX PTCH: 1.0000 | MEDICATED_PATCH | CUTANEOUS | 3 refills | Status: DC

## 2023-04-15 MED ORDER — METHOCARBAMOL 750 MG PO TABS: 750.0000 mg | ORAL_TABLET | Freq: Three times a day (TID) | ORAL | 2 refills | Status: DC | PRN

## 2023-04-15 MED ORDER — MOMETASONE FURO-FORMOTEROL FUM 100-5 MCG/ACT IN AERO: 2.0000 | INHALATION_SPRAY | Freq: Two times a day (BID) | RESPIRATORY_TRACT | 2 refills | Status: DC

## 2023-04-15 NOTE — Assessment & Plan Note (Addendum)
Noted on exam, due to her COPD and SOB/fatigue will obtain echo to further assess.  Discussed with patient.

## 2023-04-15 NOTE — Assessment & Plan Note (Signed)
Noted on past labs, is a smoker.  Recheck CBC today and monitor closely.

## 2023-04-15 NOTE — Assessment & Plan Note (Signed)
I have recommended complete cessation of tobacco use. I have discussed various options available for assistance with tobacco cessation including over the counter methods (Nicotine gum, patch and lozenges). We also discussed prescription options (Chantix, Nicotine Inhaler / Nasal Spray). The patient is not interested in pursuing any prescription tobacco cessation options at this time.  Referral for lung cancer screening placed.  

## 2023-04-15 NOTE — Assessment & Plan Note (Signed)
Chronic, improved.  BP on lower side, refer to hypotension plan of care for changes.  Educated her on changes and she was able to verbalize back. Discussed kidney protective element of ARB family, continue Olmesartan.  Would avoid ACE due to her underlying COPD.  Recommend she monitor BP at least a few mornings a week at home and document.  DASH diet at home.  Continue current medication regimen and adjust as needed.  Labs today: CBC.

## 2023-04-15 NOTE — Assessment & Plan Note (Signed)
Ongoing with current flare.  Start Lidocaine patches daily and Robaxin as needed.  May use Tylenol at home PRN.  Suspect aggravated by care giving.  No red flags. If ongoing will update imaging.

## 2023-04-15 NOTE — Progress Notes (Signed)
Patient presented to clinic for a provider  visit where based on provider order patient received a Flu vaccine. Pre-screening done and patient is eligible for vaccination. 0.5 mL Flulaval vaccine with patient permission administered to right  deltoid. Patient tolerated well, no complications encountered. Patient given information sheet and all questions and concerns were addressed.

## 2023-04-15 NOTE — Assessment & Plan Note (Signed)
Continues to run slightly on lower side in office and at home.  Recommend she ensure good hydration and stop Amlodipine -- continue Olmesartan for BP and kidney health.  Recheck next visit.  She is aware to restart Amlodipine if BP consistently >130/80 at home.

## 2023-04-15 NOTE — Progress Notes (Signed)
BP 101/65 (BP Location: Right Arm, Patient Position: Sitting, Cuff Size: Normal)   Temp 98.1 F (36.7 C) (Oral)   Resp 13   Wt 154 lb 9.6 oz (70.1 kg)   SpO2 99%   BMI 28.28 kg/m    Subjective:    Patient ID: Beth [name], female    DOB: 26-May-1970, 52 y.o.   MRN: [id number]  HPI: Beth [name] is a 52 y.o. female  Chief Complaint  Patient presents with   Hypertension   COPD    Still painful in same area as when she was in the hospital   Fatigue    Ongoing at this point    HYPERTENSION without Chronic Kidney Disease On 03/27/23 reduced BP medication to Amlodipine 2.5 MG daily due to low BP levels after sepsis.  Continues to have fatigue post sepsis, also takes care of aunt who has dementia.  Full time caregiver. Hypertension status: stable  Satisfied with current treatment? yes Duration of hypertension: chronic BP monitoring frequency:  daily BP range: 96/58 to 134/89 -- on average in 100/60-70 range BP medication side effects:  no Medication compliance: good compliance Aspirin: no Recurrent headaches: no Visual changes: no Palpitations: no Dyspnea: a little bit, not every day Chest pain: no Lower extremity edema: no Dizzy/lightheaded: no   COPD Continues to smoke, has cut back -- currently maybe 1/2 pack per day.  Prior to this was 1 PPD for several years. Used Symbicort in past, but this was too costly.  Has Albuterol at home, currently using 2-3 times a day.  Post hospital she continues to have some pain mid-back, this comes and goes.  Only when on feet a lot. 8/10 level, dull/aching.  Intermittent.  Sitting down makes this better.  Is caregiver to aunt, does a lot of pushing and pulling with her.  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 COPD status: uncontrolled Satisfied with current treatment?: yes Oxygen use: no Dyspnea frequency: occasional Cough frequency: none Rescue inhaler frequency:  2-3 times a day Limitation of activity: SOB does limit activity  at times Productive cough: no Last Spirometry: unknown Pneumovax: Up to Date Influenza: Up to Date   Relevant past medical, surgical, family and social history reviewed and updated as indicated. Interim medical history since our last visit reviewed. Allergies and medications reviewed and updated.  Review of Systems  Constitutional:  Positive for fatigue. Negative for activity change, appetite change, diaphoresis and fever.  Respiratory:  Positive for shortness of breath. Negative for cough, chest tightness and wheezing.   Cardiovascular:  Negative for chest pain, palpitations and leg swelling.  Gastrointestinal: Negative.   Musculoskeletal:  Positive for back pain.  Neurological: Negative.   Psychiatric/Behavioral: Negative.      Per HPI unless specifically indicated above     Objective:    BP 101/65 (BP Location: Right Arm, Patient Position: Sitting, Cuff Size: Normal)   Temp 98.1 F (36.7 C) (Oral)   Resp 13   Wt 154 lb 9.6 oz (70.1 kg)   SpO2 99%   BMI 28.28 kg/m   Wt Readings from Last 3 Encounters:  04/15/23 154 lb 9.6 oz (70.1 kg)  03/27/23 148 lb 6.4 oz (67.3 kg)  03/24/23 152 lb 5.4 oz (69.1 kg)    Physical Exam Vitals and nursing note reviewed.  Constitutional:      General: She is awake. She is not in acute distress.    Appearance: She is well-developed and well-groomed. She is not ill-appearing or toxic-appearing.  HENT:     Head: Normocephalic.     Right Ear: Hearing and external ear normal.     Left Ear: Hearing and external ear normal.  Eyes:     General: Lids are normal.        Right eye: No discharge.        Left eye: No discharge.     Conjunctiva/sclera: Conjunctivae normal.     Pupils: Pupils are equal, round, and reactive to light.  Neck:     Thyroid: No thyromegaly.     Vascular: No carotid bruit.  Cardiovascular:     Rate and Rhythm: Normal rate and regular rhythm.     Heart sounds: Murmur heard.     Systolic murmur is present with a  grade of 2/6.     No gallop.  Pulmonary:     Effort: Pulmonary effort is normal. No accessory muscle usage or respiratory distress.     Breath sounds: Normal breath sounds. No decreased breath sounds, wheezing or rhonchi.  Abdominal:     General: Bowel sounds are normal. There is no distension.     Palpations: Abdomen is soft.     Tenderness: There is no abdominal tenderness.  Musculoskeletal:     Cervical back: Normal range of motion and neck supple.     Thoracic back: Tenderness present. No swelling, spasms or bony tenderness. Normal range of motion. No scoliosis.     Right lower leg: No edema.     Left lower leg: No edema.  Lymphadenopathy:     Cervical: No cervical adenopathy.  Skin:    General: Skin is warm and dry.  Neurological:     Mental Status: She is alert and oriented to person, place, and time.     Deep Tendon Reflexes: Reflexes are normal and symmetric.     Reflex Scores:      Brachioradialis reflexes are 2+ on the right side and 2+ on the left side.      Patellar reflexes are 2+ on the right side and 2+ on the left side. Psychiatric:        Attention and Perception: Attention normal.        Mood and Affect: Mood normal.        Speech: Speech normal.        Behavior: Behavior normal. Behavior is cooperative.        Thought Content: Thought content normal.    Results for orders placed or performed in visit on 03/27/23  T4, free   Collection Time: 03/27/23  3:14 PM  Result Value Ref Range   Free T4 1.46 0.82 - 1.77 ng/dL  CBC with Differential/Platelet   Collection Time: 03/27/23  3:14 PM  Result Value Ref Range   WBC 11.1 (H) 3.4 - 10.8 x10E3/uL   RBC 5.18 3.77 - 5.28 x10E6/uL   Hemoglobin 16.0 (H) 11.1 - 15.9 g/dL   Hematocrit 59.5 (H) 63.8 - 46.6 %   MCV 91 79 - 97 fL   MCH 30.9 26.6 - 33.0 pg   MCHC 34.0 31.5 - 35.7 g/dL   RDW 75.6 43.3 - 29.5 %   Platelets 239 150 - 450 x10E3/uL   Neutrophils 57 Not Estab. %   Lymphs 36 Not Estab. %   Monocytes 5  Not Estab. %   Eos 1 Not Estab. %   Basos 1 Not Estab. %   Neutrophils Absolute 6.3 1.4 - 7.0 x10E3/uL   Lymphocytes Absolute 4.0 (H) 0.7 - 3.1 x10E3/uL  Monocytes Absolute 0.6 0.1 - 0.9 x10E3/uL   EOS (ABSOLUTE) 0.1 0.0 - 0.4 x10E3/uL   Basophils Absolute 0.1 0.0 - 0.2 x10E3/uL   Immature Granulocytes 0 Not Estab. %   Immature Grans (Abs) 0.0 0.0 - 0.1 x10E3/uL  Comprehensive metabolic panel   Collection Time: 03/27/23  3:14 PM  Result Value Ref Range   Glucose 91 70 - 99 mg/dL   BUN 13 6 - 24 mg/dL   Creatinine, Ser 3.47 0.57 - 1.00 mg/dL   eGFR 68 >42 VZ/DGL/8.75   BUN/Creatinine Ratio 13 9 - 23   Sodium 140 134 - 144 mmol/L   Potassium 4.5 3.5 - 5.2 mmol/L   Chloride 104 96 - 106 mmol/L   CO2 17 (L) 20 - 29 mmol/L   Calcium 9.6 8.7 - 10.2 mg/dL   Total Protein 7.1 6.0 - 8.5 g/dL   Albumin 4.5 3.8 - 4.9 g/dL   Globulin, Total 2.6 1.5 - 4.5 g/dL   Bilirubin Total 0.2 0.0 - 1.2 mg/dL   Alkaline Phosphatase 96 44 - 121 IU/L   AST 19 0 - 40 IU/L   ALT 30 0 - 32 IU/L  TSH   Collection Time: 03/27/23  3:14 PM  Result Value Ref Range   TSH 0.528 0.450 - 4.500 uIU/mL  Magnesium   Collection Time: 03/27/23  3:14 PM  Result Value Ref Range   Magnesium 1.8 1.6 - 2.3 mg/dL      Assessment & Plan:   Problem List Items Addressed This Visit       Cardiovascular and Mediastinum   Hypertension (Chronic)   Chronic, improved.  BP on lower side, refer to hypotension plan of care for changes.  Educated her on changes and she was able to verbalize back. Discussed kidney protective element of ARB family, continue Olmesartan.  Would avoid ACE due to her underlying COPD.  Recommend she monitor BP at least a few mornings a week at home and document.  DASH diet at home.  Continue current medication regimen and adjust as needed.  Labs today: CBC.       Hypotension   Continues to run slightly on lower side in office and at home.  Recommend she ensure good hydration and stop Amlodipine --  continue Olmesartan for BP and kidney health.  Recheck next visit.  She is aware to restart Amlodipine if BP consistently >130/80 at home.        Respiratory   Chronic obstructive pulmonary disease (HCC) - Primary   Chronic, ongoing.  Long time smoker, she is cutting back.  Praised for this.  Recommend we restart a maintenance inhaler.  Elwin Sleight appears to be covered, discussed with her that it is similar to Symbicort, we will trial this.  Continue Albuterol as needed.  Lung cancer screening ordered. Spirometry future visit.      Relevant Medications   mometasone-formoterol (DULERA) 100-5 MCG/ACT AERO   Other Relevant Orders   CBC with Differential/Platelet     Other   Elevated hemoglobin (HCC)   Noted on past labs, is a smoker.  Recheck CBC today and monitor closely.      Nicotine dependence, cigarettes, uncomplicated   I have recommended complete cessation of tobacco use. I have discussed various options available for assistance with tobacco cessation including over the counter methods (Nicotine gum, patch and lozenges). We also discussed prescription options (Chantix, Nicotine Inhaler / Nasal Spray). The patient is not interested in pursuing any prescription tobacco cessation options at this time.  Referral for lung cancer screening placed.       Relevant Orders   MM 3D SCREENING MAMMOGRAM BILATERAL BREAST   Systolic murmur   Noted on exam, due to her COPD and SOB/fatigue will obtain echo to further assess.  Discussed with patient.      Relevant Orders   ECHOCARDIOGRAM COMPLETE   Thoracic spinal stenosis   Ongoing with current flare.  Start Lidocaine patches daily and Robaxin as needed.  May use Tylenol at home PRN.  Suspect aggravated by care giving.  No red flags. If ongoing will update imaging.      Other Visit Diagnoses       Encounter for screening mammogram for malignant neoplasm of breast       Mammogram ordered.   Relevant Orders   Ambulatory Referral Lung Cancer  Screening [facility]     Flu vaccine need       Flu shot today, educated patient.   Relevant Orders   Flu vaccine trivalent PF, 6mos and older(Flulaval,Afluria,Fluarix,Fluzone)        Follow up plan: Return for as scheduled January 31st.

## 2023-04-15 NOTE — Assessment & Plan Note (Signed)
Chronic, ongoing.  Long time smoker, she is cutting back.  Praised for this.  Recommend we restart a maintenance inhaler.  Elwin Sleight appears to be covered, discussed with her that it is similar to Symbicort, we will trial this.  Continue Albuterol as needed.  Lung cancer screening ordered. Spirometry future visit.

## 2023-04-16 ENCOUNTER — Other Ambulatory Visit: Payer: Self-pay | Admitting: Nurse Practitioner

## 2023-04-16 DIAGNOSIS — J449 Chronic obstructive pulmonary disease, unspecified: Secondary | ICD-10-CM

## 2023-04-16 LAB — CBC WITH DIFFERENTIAL/PLATELET
Basophils Absolute: 0.1 10*3/uL (ref 0.0–0.2)
Basos: 1 %
EOS (ABSOLUTE): 0.1 10*3/uL (ref 0.0–0.4)
Eos: 1 %
Hematocrit: 41.1 % (ref 34.0–46.6)
Hemoglobin: 13.7 g/dL (ref 11.1–15.9)
Immature Grans (Abs): 0 10*3/uL (ref 0.0–0.1)
Immature Granulocytes: 0 %
Lymphocytes Absolute: 2.8 10*3/uL (ref 0.7–3.1)
Lymphs: 25 %
MCH: 31 pg (ref 26.6–33.0)
MCHC: 33.3 g/dL (ref 31.5–35.7)
MCV: 93 fL (ref 79–97)
Monocytes Absolute: 0.5 10*3/uL (ref 0.1–0.9)
Monocytes: 5 %
Neutrophils Absolute: 7.4 10*3/uL — ABNORMAL HIGH (ref 1.4–7.0)
Neutrophils: 68 %
Platelets: 249 10*3/uL (ref 150–450)
RBC: 4.42 x10E6/uL (ref 3.77–5.28)
RDW: 12.4 % (ref 11.7–15.4)
WBC: 11 10*3/uL — ABNORMAL HIGH (ref 3.4–10.8)

## 2023-04-16 MED ORDER — DOXYCYCLINE HYCLATE 100 MG PO TABS: 100.0000 mg | ORAL_TABLET | Freq: Two times a day (BID) | ORAL | 0 refills | Status: AC

## 2023-04-16 NOTE — Progress Notes (Signed)
Contacted via MyChart - lab only visit in 3 weeks.    Good afternoon Copper, your labs have returned and white blood cell count remains a little elevated but slight trend down.  Neutrophils a little elevated.  I am going to send in an antibiotic since you are still not 100% and see if this helps get you on the mend more.  I would like to recheck this outpatient in 3 weeks.  Any questions? Keep being amazing!!  Thank you for allowing me to participate in your care.  I appreciate you. Kindest regards, Patty Lopezgarcia

## 2023-04-16 NOTE — Progress Notes (Signed)
Attempted to reach patient to schedule 3 week lab only visit, LVM to call office back to get scheduled.  Will put in CRM.

## 2023-05-03 DIAGNOSIS — E7800 Pure hypercholesterolemia, unspecified: Secondary | ICD-10-CM | POA: Insufficient documentation

## 2023-05-03 NOTE — Patient Instructions (Signed)

## 2023-05-08 ENCOUNTER — Encounter: Payer: Self-pay | Admitting: Nurse Practitioner

## 2023-05-08 ENCOUNTER — Telehealth: Payer: Self-pay

## 2023-05-08 ENCOUNTER — Ambulatory Visit: Payer: Medicaid Other | Admitting: Nurse Practitioner

## 2023-05-08 VITALS — BP 118/66 | HR 69 | Temp 97.8°F | Ht 62.0 in | Wt 150.6 lb

## 2023-05-08 DIAGNOSIS — I952 Hypotension due to drugs: Secondary | ICD-10-CM

## 2023-05-08 DIAGNOSIS — K7010 Alcoholic hepatitis without ascites: Secondary | ICD-10-CM

## 2023-05-08 DIAGNOSIS — G621 Alcoholic polyneuropathy: Secondary | ICD-10-CM | POA: Diagnosis not present

## 2023-05-08 DIAGNOSIS — J449 Chronic obstructive pulmonary disease, unspecified: Secondary | ICD-10-CM | POA: Diagnosis not present

## 2023-05-08 DIAGNOSIS — D582 Other hemoglobinopathies: Secondary | ICD-10-CM

## 2023-05-08 DIAGNOSIS — R011 Cardiac murmur, unspecified: Secondary | ICD-10-CM

## 2023-05-08 DIAGNOSIS — E7800 Pure hypercholesterolemia, unspecified: Secondary | ICD-10-CM

## 2023-05-08 DIAGNOSIS — I10 Essential (primary) hypertension: Secondary | ICD-10-CM | POA: Diagnosis not present

## 2023-05-08 DIAGNOSIS — F17210 Nicotine dependence, cigarettes, uncomplicated: Secondary | ICD-10-CM | POA: Diagnosis not present

## 2023-05-08 MED ORDER — LIDOCAINE 5 % EX PTCH: 1.0000 | MEDICATED_PATCH | CUTANEOUS | 3 refills | Status: AC

## 2023-05-08 NOTE — Assessment & Plan Note (Addendum)
 Chronic, stable.  Discussed kidney protective element of ARB family, continue Olmesartan .  Would avoid ACE due to her underlying COPD.  Recommend she monitor BP at least a few mornings a week at home and document.  DASH diet at home.  Continue current medication regimen and adjust as needed - Amlodipine  and Olmesartan .  Labs today: CBC.

## 2023-05-08 NOTE — Assessment & Plan Note (Signed)
I have recommended complete cessation of tobacco use. I have discussed various options available for assistance with tobacco cessation including over the counter methods (Nicotine gum, patch and lozenges). We also discussed prescription options (Chantix, Nicotine Inhaler / Nasal Spray). The patient is not interested in pursuing any prescription tobacco cessation options at this time.  Referral for lung cancer screening placed.  

## 2023-05-08 NOTE — Assessment & Plan Note (Signed)
 Noted on past labs.  Focused on diet and exercise.  Recommend cut back on smoking and continue alcohol cessation.  Labs today. The 10-year ASCVD risk score (Arnett DK, et al., 2019) is: 3.8%   Values used to calculate the score:     Age: 53 years     Sex: Female     Is Non-Hispanic African American: No     Diabetic: No     Tobacco smoker: Yes     Systolic Blood Pressure: 118 mmHg     Is BP treated: Yes     HDL Cholesterol: 65 mg/dL     Total Cholesterol: 188 mg/dL

## 2023-05-08 NOTE — Assessment & Plan Note (Signed)
 Chronic, stable with Gabapentin on board.  Will continue this.  Recommend she continue cessation of alcohol use for overall health and wellness.  Adjust regimen as needed based on findings.

## 2023-05-08 NOTE — Assessment & Plan Note (Signed)
 Noted on exam, scheduled for echo next week.  Determine next steps after results return.

## 2023-05-08 NOTE — Assessment & Plan Note (Signed)
Noted on past labs, is a smoker.  Recheck CBC today and monitor closely.

## 2023-05-08 NOTE — Telephone Encounter (Signed)
 PA for Lidocaine initiated and submitted via Cover My Meds. Key:  [id number]

## 2023-05-08 NOTE — Assessment & Plan Note (Signed)
 Chronic, ongoing.  Long time smoker, she is cutting back.  Praised for this.  Recommend we restart a maintenance inhaler. Continue Dulera  as is offering benefit and less Albuterol  use.  Continue Albuterol  as needed.  Lung cancer screening ordered. Spirometry future visit.

## 2023-05-08 NOTE — Progress Notes (Signed)
 BP 118/66   Pulse 69   Temp 97.8 F (36.6 C) (Oral)   Ht 5' 2 (1.575 m)   Wt 150 lb 9.6 oz (68.3 kg)   SpO2 97%   BMI 27.55 kg/m    Subjective:    Patient ID: Beth [name], female    DOB: 1970-08-24, 53 y.o.   MRN: [id number]  HPI: Beth [name] is a 53 y.o. female  Chief Complaint  Patient presents with   COPD   HYPERTENSION without Chronic Kidney Disease Continues Olmesartan  and Amlodipine . Maintain on Gabapentin  for neuropathy due to past alcohol use.   Hypertension status: stable  Satisfied with current treatment? yes Duration of hypertension: chronic BP monitoring frequency:  not checking BP range:  BP medication side effects:  no Medication compliance: good compliance Aspirin: no Recurrent headaches: no Visual changes: no Palpitations: no Dyspnea: no Chest pain: no Lower extremity edema: no Dizzy/lightheaded: no  The 10-year ASCVD risk score (Arnett DK, et al., 2019) is: 3.8%   Values used to calculate the score:     Age: 24 years     Sex: Female     Is Non-Hispanic African American: No     Diabetic: No     Tobacco smoker: Yes     Systolic Blood Pressure: 118 mmHg     Is BP treated: Yes     HDL Cholesterol: 65 mg/dL     Total Cholesterol: 188 mg/dL  COPD Reports feeling better today. Continues Dulera , not having to use Albuterol .  Smoking 1/2 PPD at present, in past was 1 PPD.  Has been smoking since teen years.  History of elevation in hemoglobin, but recent labs improved. COPD status: stable Satisfied with current treatment?: yes Oxygen use: no Dyspnea frequency: no Cough frequency: no Rescue inhaler frequency:  no recent use Limitation of activity: no Productive cough:  Last Spirometry: unknown Pneumovax: Not up to Date Influenza: Up to Date   Relevant past medical, surgical, family and social history reviewed and updated as indicated. Interim medical history since our last visit reviewed. Allergies and medications reviewed and  updated.  Review of Systems  Constitutional:  Negative for activity change, appetite change, diaphoresis, fatigue and fever.  Respiratory:  Negative for cough, chest tightness and shortness of breath.   Cardiovascular:  Negative for chest pain, palpitations and leg swelling.  Neurological: Negative.   Psychiatric/Behavioral: Negative.      Per HPI unless specifically indicated above     Objective:    BP 118/66   Pulse 69   Temp 97.8 F (36.6 C) (Oral)   Ht 5' 2 (1.575 m)   Wt 150 lb 9.6 oz (68.3 kg)   SpO2 97%   BMI 27.55 kg/m   Wt Readings from Last 3 Encounters:  05/08/23 150 lb 9.6 oz (68.3 kg)  04/15/23 154 lb 9.6 oz (70.1 kg)  03/27/23 148 lb 6.4 oz (67.3 kg)    Physical Exam Vitals and nursing note reviewed.  Constitutional:      General: She is awake. She is not in acute distress.    Appearance: She is well-developed and well-groomed. She is not ill-appearing or toxic-appearing.  HENT:     Head: Normocephalic.     Right Ear: Hearing and external ear normal.     Left Ear: Hearing and external ear normal.  Eyes:     General: Lids are normal.        Right eye: No discharge.  Left eye: No discharge.     Conjunctiva/sclera: Conjunctivae normal.     Pupils: Pupils are equal, round, and reactive to light.  Neck:     Thyroid : No thyromegaly.     Vascular: No carotid bruit.  Cardiovascular:     Rate and Rhythm: Normal rate and regular rhythm.     Heart sounds: Murmur heard.     Systolic murmur is present with a grade of 2/6.     No gallop.  Pulmonary:     Effort: Pulmonary effort is normal. No accessory muscle usage or respiratory distress.     Breath sounds: Normal breath sounds.  Abdominal:     General: Bowel sounds are normal. There is no distension.     Palpations: Abdomen is soft.     Tenderness: There is no abdominal tenderness.  Musculoskeletal:     Cervical back: Normal range of motion and neck supple.     Right lower leg: No edema.     Left  lower leg: No edema.  Lymphadenopathy:     Cervical: No cervical adenopathy.  Skin:    General: Skin is warm and dry.  Neurological:     Mental Status: She is alert and oriented to person, place, and time.     Deep Tendon Reflexes: Reflexes are normal and symmetric.     Reflex Scores:      Brachioradialis reflexes are 2+ on the right side and 2+ on the left side.      Patellar reflexes are 2+ on the right side and 2+ on the left side. Psychiatric:        Attention and Perception: Attention normal.        Mood and Affect: Mood normal.        Speech: Speech normal.        Behavior: Behavior normal. Behavior is cooperative.        Thought Content: Thought content normal.    Results for orders placed or performed in visit on 04/15/23  CBC with Differential/Platelet   Collection Time: 04/15/23  3:08 PM  Result Value Ref Range   WBC 11.0 (H) 3.4 - 10.8 x10E3/uL   RBC 4.42 3.77 - 5.28 x10E6/uL   Hemoglobin 13.7 11.1 - 15.9 g/dL   Hematocrit 58.8 65.9 - 46.6 %   MCV 93 79 - 97 fL   MCH 31.0 26.6 - 33.0 pg   MCHC 33.3 31.5 - 35.7 g/dL   RDW 87.5 88.2 - 84.5 %   Platelets 249 150 - 450 x10E3/uL   Neutrophils 68 Not Estab. %   Lymphs 25 Not Estab. %   Monocytes 5 Not Estab. %   Eos 1 Not Estab. %   Basos 1 Not Estab. %   Neutrophils Absolute 7.4 (H) 1.4 - 7.0 x10E3/uL   Lymphocytes Absolute 2.8 0.7 - 3.1 x10E3/uL   Monocytes Absolute 0.5 0.1 - 0.9 x10E3/uL   EOS (ABSOLUTE) 0.1 0.0 - 0.4 x10E3/uL   Basophils Absolute 0.1 0.0 - 0.2 x10E3/uL   Immature Granulocytes 0 Not Estab. %   Immature Grans (Abs) 0.0 0.0 - 0.1 x10E3/uL      Assessment & Plan:   Problem List Items Addressed This Visit       Cardiovascular and Mediastinum   Hypertension (Chronic)   Chronic, stable.  Discussed kidney protective element of ARB family, continue Olmesartan .  Would avoid ACE due to her underlying COPD.  Recommend she monitor BP at least a few mornings a week at home and  document.  DASH diet at  home.  Continue current medication regimen and adjust as needed - Amlodipine  and Olmesartan .  Labs today: CBC.         Respiratory   Chronic obstructive pulmonary disease (HCC)   Chronic, ongoing.  Long time smoker, she is cutting back.  Praised for this.  Recommend we restart a maintenance inhaler. Continue Dulera  as is offering benefit and less Albuterol  use.  Continue Albuterol  as needed.  Lung cancer screening ordered. Spirometry future visit.      Relevant Orders   Ambulatory Referral Lung Cancer Screening [facility]     Nervous and Auditory   Alcoholic peripheral neuropathy (HCC) - Primary   Chronic, stable with Gabapentin  on board.  Will continue this.  Recommend she continue cessation of alcohol use for overall health and wellness.  Adjust regimen as needed based on findings.      Relevant Orders   CBC with Differential/Platelet     Other   Elevated hemoglobin (HCC)   Noted on past labs, is a smoker.  Recheck CBC today and monitor closely.      Relevant Orders   CBC with Differential/Platelet   Elevated low density lipoprotein (LDL) cholesterol level   Noted on past labs.  Focused on diet and exercise.  Recommend cut back on smoking and continue alcohol cessation.  Labs today. The 10-year ASCVD risk score (Arnett DK, et al., 2019) is: 3.8%   Values used to calculate the score:     Age: 65 years     Sex: Female     Is Non-Hispanic African American: No     Diabetic: No     Tobacco smoker: Yes     Systolic Blood Pressure: 118 mmHg     Is BP treated: Yes     HDL Cholesterol: 65 mg/dL     Total Cholesterol: 188 mg/dL       Relevant Orders   Lipid Panel w/o Chol/HDL Ratio   Nicotine  dependence, cigarettes, uncomplicated   I have recommended complete cessation of tobacco use. I have discussed various options available for assistance with tobacco cessation including over the counter methods (Nicotine  gum, patch and lozenges). We also discussed prescription  options (Chantix, Nicotine  Inhaler / Nasal Spray). The patient is not interested in pursuing any prescription tobacco cessation options at this time.  Referral for lung cancer screening placed.       Relevant Orders   Ambulatory Referral Lung Cancer Screening [facility]   Systolic murmur   Noted on exam, scheduled for echo next week.  Determine next steps after results return.        Follow up plan: Return in about 6 months (around 11/05/2023) for COPD, HTN/HLD -- needs spirometry + PCV20.

## 2023-05-09 LAB — LIPID PANEL W/O CHOL/HDL RATIO
Cholesterol, Total: 172 mg/dL (ref 100–199)
HDL: 49 mg/dL (ref >39)
LDL Chol Calc (NIH): 104 mg/dL — ABNORMAL HIGH (ref 0–99)
Triglycerides: 105 mg/dL (ref 0–149)
VLDL Cholesterol Cal: 19 mg/dL (ref 5–40)

## 2023-05-09 LAB — CBC WITH DIFFERENTIAL/PLATELET
Basophils Absolute: 0.1 10*3/uL (ref 0.0–0.2)
Basos: 1 %
EOS (ABSOLUTE): 0.1 10*3/uL (ref 0.0–0.4)
Eos: 1 %
Hematocrit: 41.4 % (ref 34.0–46.6)
Hemoglobin: 13.6 g/dL (ref 11.1–15.9)
Immature Grans (Abs): 0 10*3/uL (ref 0.0–0.1)
Immature Granulocytes: 0 %
Lymphocytes Absolute: 3.3 10*3/uL — ABNORMAL HIGH (ref 0.7–3.1)
Lymphs: 37 %
MCH: 30.2 pg (ref 26.6–33.0)
MCHC: 32.9 g/dL (ref 31.5–35.7)
MCV: 92 fL (ref 79–97)
Monocytes Absolute: 0.5 10*3/uL (ref 0.1–0.9)
Monocytes: 5 %
Neutrophils Absolute: 4.9 10*3/uL (ref 1.4–7.0)
Neutrophils: 56 %
Platelets: 273 10*3/uL (ref 150–450)
RBC: 4.5 x10E6/uL (ref 3.77–5.28)
RDW: 12.7 % (ref 11.7–15.4)
WBC: 8.9 10*3/uL (ref 3.4–10.8)

## 2023-05-09 NOTE — Progress Notes (Signed)
 Contacted via MyChart The 10-year ASCVD risk score (Arnett DK, et al., 2019) is: 4.6%   Values used to calculate the score:     Age: 53 years     Sex: Female     Is Non-Hispanic African American: No     Diabetic: No     Tobacco smoker: Yes     Systolic Blood Pressure: 118 mmHg     Is BP treated: Yes     HDL Cholesterol: 49 mg/dL     Total Cholesterol: 172 mg/dL   Good morning Beth [name], your labs have returned and CBC is showing improvement!!  Great news!! Lipid panel is still showing elevation in LDL, bad cholesterol, which we will monitor closely and start medication as needed for.  Any questions? Keep being amazing!!  Thank you for allowing me to participate in your care.  I appreciate you. Kindest regards, Alvina Strother

## 2023-05-16 ENCOUNTER — Ambulatory Visit
Admission: RE | Admit: 2023-05-16 | Discharge: 2023-05-16 | Disposition: A | Discharge status: Home / Self Care | Payer: Medicaid Other | Source: Ambulatory Visit | Attending: Nurse Practitioner | Admitting: Nurse Practitioner

## 2023-05-16 DIAGNOSIS — R011 Cardiac murmur, unspecified: Secondary | ICD-10-CM

## 2023-05-16 DIAGNOSIS — I10 Essential (primary) hypertension: Secondary | ICD-10-CM | POA: Diagnosis not present

## 2023-05-16 DIAGNOSIS — J449 Chronic obstructive pulmonary disease, unspecified: Secondary | ICD-10-CM | POA: Insufficient documentation

## 2023-05-16 LAB — ECHOCARDIOGRAM COMPLETE
AR max vel: 2.63 cm2
AV Area VTI: 2.28 cm2
AV Area mean vel: 2.14 cm2
AV Mean grad: 4.5 mm[Hg]
AV Peak grad: 7.8 mm[Hg]
Ao pk vel: 1.4 m/s
Area-P 1/2: 3.77 cm2
MV VTI: 2.41 cm2
S' Lateral: 3.2 cm

## 2023-05-16 NOTE — Progress Notes (Signed)
*  PRELIMINARY RESULTS* Echocardiogram 2D Echocardiogram has been performed.  [name] 05/16/2023, 9:35 AM

## 2023-05-16 NOTE — Progress Notes (Signed)
Contacted via MyChart   Good morning Beth [name], overall heart pump and structure looks great.  I suspect the murmur may have just been more prominently hearing flow through heart as all valves look great.  Any questions? Keep being amazing!!  Thank you for allowing me to participate in your care.  I appreciate you. Kindest regards, Mykaila Blunck

## 2023-05-19 ENCOUNTER — Encounter: Payer: Self-pay | Admitting: Nurse Practitioner

## 2023-05-19 DIAGNOSIS — J449 Chronic obstructive pulmonary disease, unspecified: Secondary | ICD-10-CM

## 2023-05-20 MED ORDER — ALBUTEROL SULFATE HFA 108 (90 BASE) MCG/ACT IN AERS: 2.0000 | INHALATION_SPRAY | RESPIRATORY_TRACT | 3 refills | Status: AC | PRN

## 2023-05-31 ENCOUNTER — Ambulatory Visit: Payer: Medicaid Other | Admitting: Nurse Practitioner

## 2023-06-10 ENCOUNTER — Other Ambulatory Visit: Payer: Self-pay | Admitting: *Deleted

## 2023-06-10 DIAGNOSIS — Z87891 Personal history of nicotine dependence: Secondary | ICD-10-CM

## 2023-06-10 DIAGNOSIS — F17210 Nicotine dependence, cigarettes, uncomplicated: Secondary | ICD-10-CM

## 2023-06-10 DIAGNOSIS — Z122 Encounter for screening for malignant neoplasm of respiratory organs: Secondary | ICD-10-CM

## 2023-06-24 ENCOUNTER — Ambulatory Visit (INDEPENDENT_AMBULATORY_CARE_PROVIDER_SITE_OTHER): Payer: Medicaid Other | Admitting: Acute Care

## 2023-06-24 DIAGNOSIS — F17210 Nicotine dependence, cigarettes, uncomplicated: Secondary | ICD-10-CM

## 2023-06-24 NOTE — Progress Notes (Addendum)
 Provider Attestation I agree with the documentation of the Shared Decision Making visit,  smoking cessation counseling if appropriate, and verification or eligibility for lung cancer screening as documented by the RN Nurse Navigator.   Raejean Bullock, MSN, AGACNP-BC [facility] See Amion for personal pager PCCM on call pager [phone]     Virtual Visit via Telephone Note  I connected with Beth [name] on 06/24/23 at 10:30 AM EST by telephone and verified that I am speaking with the correct person using two identifiers.  Location: Patient: Beth [name] Provider: Alyse Bach, RN   I discussed the limitations, risks, security and privacy concerns of performing an evaluation and management service by telephone and the availability of in person appointments. I also discussed with the patient that there may be a patient responsible charge related to this service. The patient expressed understanding and agreed to proceed.    Shared Decision Making Visit Lung Cancer Screening Program [phone])   Eligibility: Age 53 y.o. Pack Years Smoking History Calculation 25 (# packs/per year x # years smoked) Recent History of coughing up blood  no Unexplained weight loss? no ( >Than 15 pounds within the last 6 months ) Prior History Lung / other cancer no (Diagnosis within the last 5 years already requiring surveillance chest CT Scans). Smoking Status Current Smoker Former Smokers: Years since quit: n/a  Quit Date: n/a  Visit Components: Discussion included one or more decision making aids. yes Discussion included risk/benefits of screening. yes Discussion included potential follow up diagnostic testing for abnormal scans. yes Discussion included meaning and risk of over diagnosis. yes Discussion included meaning and risk of False Positives. yes Discussion included meaning of total radiation exposure. yes  Counseling Included: Importance of  adherence to annual lung cancer LDCT screening. yes Impact of comorbidities on ability to participate in the program. yes Ability and willingness to under diagnostic treatment. yes  Smoking Cessation Counseling: Current Smokers:  Discussed importance of smoking cessation. yes Information about tobacco cessation classes and interventions provided to patient. yes Patient provided with "ticket" for LDCT Scan. no Symptomatic Patient. yes  Counseling(Intermediate counseling: > three minutes) 99406 Diagnosis Code: Tobacco Use Z72.0 Asymptomatic Patient yes  Counseling (Intermediate counseling: > three minutes counseling) [id number] Former Smokers:  Discussed the importance of maintaining cigarette abstinence. yes Diagnosis Code: Personal History of Nicotine  Dependence. Y40.347 Information about tobacco cessation classes and interventions provided to patient. Yes Patient provided with "ticket" for LDCT Scan. no Written Order for Lung Cancer Screening with LDCT placed in Epic. Yes (CT Chest Lung Cancer Screening Low Dose W/O CM) [id number] Z12.2-Screening of respiratory organs Z87.891-Personal history of nicotine  dependence   Alyse Bach, RN

## 2023-06-24 NOTE — Patient Instructions (Signed)

## 2023-06-25 ENCOUNTER — Ambulatory Visit
Admission: RE | Admit: 2023-06-25 | Discharge: 2023-06-25 | Disposition: A | Discharge status: Home / Self Care | Payer: Medicaid Other | Source: Ambulatory Visit | Attending: Acute Care | Admitting: Acute Care

## 2023-06-25 DIAGNOSIS — Z87891 Personal history of nicotine dependence: Secondary | ICD-10-CM | POA: Insufficient documentation

## 2023-06-25 DIAGNOSIS — Z122 Encounter for screening for malignant neoplasm of respiratory organs: Secondary | ICD-10-CM | POA: Insufficient documentation

## 2023-06-25 DIAGNOSIS — F17210 Nicotine dependence, cigarettes, uncomplicated: Secondary | ICD-10-CM | POA: Diagnosis not present

## 2023-07-18 ENCOUNTER — Other Ambulatory Visit: Payer: Self-pay | Admitting: Nurse Practitioner

## 2023-07-19 NOTE — Telephone Encounter (Signed)
 Requested Prescriptions  Pending Prescriptions Disp Refills   mometasone-formoterol (DULERA) 100-5 MCG/ACT AERO [Pharmacy Med Name: Dulera 100-5 MCG/ACT Inhalation Aerosol] 13 g 1    Sig: Inhale 2 puffs by mouth twice daily     Pulmonology:  Combination Products Passed - 07/19/2023 10:47 AM      Passed - Valid encounter within last 12 months    Recent Outpatient Visits           2 months ago Alcoholic peripheral neuropathy (HCC)   [place] [facility] [place], Olivet T, NP   3 months ago Chronic obstructive pulmonary disease, unspecified COPD type (HCC)   [place] [facility] [place], Corrie Dandy T, NP   3 months ago Sepsis with hypotension (HCC)   [place] [facility] [place], Corrie Dandy T, NP   5 months ago Positive self-administered antigen test for COVID-19   [place] [facility], Sherran Needs, NP   7 months ago Primary hypertension   [place] [facility] [place], Dorie Rank, NP       Future Appointments             In 3 months [place], Dorie Rank, NP [place] [facility], [facility]

## 2023-07-22 ENCOUNTER — Other Ambulatory Visit: Payer: Self-pay | Admitting: Acute Care

## 2023-07-22 DIAGNOSIS — Z122 Encounter for screening for malignant neoplasm of respiratory organs: Secondary | ICD-10-CM

## 2023-07-22 DIAGNOSIS — F17210 Nicotine dependence, cigarettes, uncomplicated: Secondary | ICD-10-CM

## 2023-07-22 DIAGNOSIS — Z87891 Personal history of nicotine dependence: Secondary | ICD-10-CM

## 2023-07-23 ENCOUNTER — Encounter: Payer: Self-pay | Admitting: Nurse Practitioner

## 2023-08-02 ENCOUNTER — Other Ambulatory Visit: Payer: Self-pay | Admitting: Nurse Practitioner

## 2023-08-05 ENCOUNTER — Other Ambulatory Visit: Payer: Self-pay | Admitting: Nurse Practitioner

## 2023-08-06 NOTE — Telephone Encounter (Signed)
 Requested medication (s) are due for refill today:no  Requested medication (s) are on the active medication list: yes  Last refill:  07/19/23  13 g 1 RF  Future visit scheduled: yes  Notes to clinic:  med not assigned to NT to RF too soon   Requested Prescriptions  Pending Prescriptions Disp Refills   DULERA 100-5 MCG/ACT AERO [Pharmacy Med Name: Elwin Sleight 100-5 MCG/ACT Inhalation Aerosol] 13 g 0    Sig: Inhale 2 puffs by mouth twice daily     There is no refill protocol information for this order

## 2023-09-18 ENCOUNTER — Other Ambulatory Visit: Payer: Self-pay | Admitting: *Deleted

## 2023-09-18 ENCOUNTER — Inpatient Hospital Stay
Admission: RE | Admit: 2023-09-18 | Discharge: 2023-09-18 | Disposition: A | Discharge status: Home / Self Care | Payer: Self-pay | Source: Ambulatory Visit | Attending: Nurse Practitioner | Admitting: Nurse Practitioner

## 2023-09-18 DIAGNOSIS — Z1231 Encounter for screening mammogram for malignant neoplasm of breast: Secondary | ICD-10-CM

## 2023-09-26 ENCOUNTER — Ambulatory Visit

## 2023-10-01 ENCOUNTER — Other Ambulatory Visit: Payer: Self-pay | Admitting: Nurse Practitioner

## 2023-10-01 DIAGNOSIS — G621 Alcoholic polyneuropathy: Secondary | ICD-10-CM

## 2023-10-02 NOTE — Telephone Encounter (Signed)
 Requested Prescriptions  Pending Prescriptions Disp Refills   gabapentin  (NEURONTIN ) 300 MG capsule [Pharmacy Med Name: Gabapentin  300 MG Oral Capsule] 360 capsule 0    Sig: TAKE 1 CAPSULE BY MOUTH IN THE MORNING AND 1 AT IN THE AFTERNOON AND 2 AT BEDTIME     Neurology: Anticonvulsants - gabapentin  Failed - 10/02/2023 11:25 AM      Failed - Valid encounter within last 12 months    Recent Outpatient Visits   None     Future Appointments             In 1 month [name], Beth T, NP [place] [facility], [facility]            Passed - Cr in normal range and within 360 days    Creat  Date Value Ref Range Status  04/25/2021 0.80 0.50 - 1.03 mg/dL Final   Creatinine, Ser  Date Value Ref Range Status  03/27/2023 0.99 0.57 - 1.00 mg/dL Final         Passed - Completed PHQ-2 or PHQ-9 in the last 360 days

## 2023-11-02 DIAGNOSIS — I700 Atherosclerosis of aorta: Secondary | ICD-10-CM | POA: Insufficient documentation

## 2023-11-02 NOTE — Patient Instructions (Signed)
 Phentermine, Orlistat, Contrave, Metformin  Be Involved in Caring For Your Health:  Taking Medications When medications are taken as directed, they can greatly improve your health. But if they are not taken as prescribed, they may not work. In some cases, not taking them correctly can be harmful. To help ensure your treatment remains effective and safe, understand your medications and how to take them. Bring your medications to each visit for review by your provider.  Your lab results, notes, and after visit summary will be available on My Chart. We strongly encourage you to use this feature. If lab results are abnormal the clinic will contact you with the appropriate steps. If the clinic does not contact you assume the results are satisfactory. You can always view your results on My Chart. If you have questions regarding your health or results, please contact the clinic during office hours. You can also ask questions on My Chart.  We at [facility] are grateful that you chose us  to provide your care. We strive to provide evidence-based and compassionate care and are always looking for feedback. If you get a survey from the clinic please complete this so we can hear your opinions.  COPD and Physical Activity Chronic obstructive pulmonary disease (COPD) is a long-term, or chronic, condition that affects the lungs. COPD is a general term that can be used to describe many problems that cause inflammation of the lungs and limit airflow. These conditions include chronic bronchitis and emphysema. The main symptom of COPD is shortness of breath, which makes it harder to do even simple tasks. This can also make it harder to exercise and stay active. Talk with your health care provider about treatments to help you breathe better and actions you can take to prevent breathing problems during physical activity. What are the benefits of exercising when you have COPD? Exercising regularly is an  important part of a healthy lifestyle. You can still exercise and do physical activities even though you have COPD. Exercise and physical activity improve your shortness of breath by increasing blood flow (circulation). This causes your heart to pump more oxygen through your body. Moderate exercise can: Improve oxygen use. Increase your energy level. Help with shortness of breath. Strengthen your breathing muscles. Improve heart health. Help with sleep. Improve your self-esteem and feelings of self-worth. Lower depression, stress, and anxiety. Exercise can benefit everyone with COPD. The severity of your disease may affect how hard you can exercise, especially at first, but everyone can benefit. Talk with your health care provider about how much exercise is safe for you, and which activities and exercises are safe for you. What actions can I take to prevent breathing problems during physical activity? Sign up for a pulmonary rehabilitation program. This type of program may include: Education about lung diseases. Exercise classes that teach you how to exercise and be more active while improving your breathing. This usually involves: Exercise using your lower extremities, such as a stationary bicycle. About 30 minutes of exercise, 2 to 5 times per week, for 6 to 12 weeks. Strength training, such as push-ups or leg lifts. Nutrition education. Group classes in which you can talk with others who also have COPD and learn ways to manage stress. If you use an oxygen tank, you should use it while you exercise. Work with your health care provider to adjust your oxygen for your physical activity. Your resting flow rate is different from your flow rate during physical activity. How to manage your  breathing while exercising While you are exercising: Take slow breaths. Pace yourself, and do nottry to go too fast. Purse your lips while breathing out. Pursing your lips is similar to a kissing or whistling  position. If doing exercise that uses a quick burst of effort, such as weight lifting: Breathe in before starting the exercise. Breathe out during the hardest part of the exercise, such as raising the weights. Where to find support You can find support for exercising with COPD from: Your health care provider. A pulmonary rehabilitation program. Your local health department or community health programs. Support groups, either online or in-person. Your health care provider may be able to recommend support groups. Where to find more information You can find more information about exercising with COPD from: American Lung Association: lung.org COPD Foundation: copdfoundation.org Contact a health care provider if: Your symptoms get worse. You have nausea. You have a fever. You want to start a new exercise program or a new activity. Get help right away if: You have chest pain. You cannot breathe. These symptoms may represent a serious problem that is an emergency. Do not wait to see if the symptoms will go away. Get medical help right away. Call your local emergency services (911 in the [place] Do not drive yourself to the hospital. Summary COPD is a general term that can be used to describe many different lung problems that cause lung inflammation and limit airflow. This includes chronic bronchitis and emphysema. Exercise and physical activity improve your shortness of breath by increasing blood flow (circulation). This causes your heart to provide more oxygen to your body. Contact your health care provider before starting any exercise program or new activity. Ask your health care provider what exercises and activities are safe for you. This information is not intended to replace advice given to you by your health care provider. Make sure you discuss any questions you have with your health care provider. Document Revised: 03/01/2023 Document Reviewed: 03/01/2023 Elsevier Patient Education   2024 [employer].

## 2023-11-06 ENCOUNTER — Encounter: Payer: Self-pay | Admitting: Nurse Practitioner

## 2023-11-06 ENCOUNTER — Ambulatory Visit: Payer: Self-pay | Admitting: Nurse Practitioner

## 2023-11-06 VITALS — BP 112/67 | HR 63 | Temp 97.8°F | Ht 62.0 in | Wt 143.4 lb

## 2023-11-06 DIAGNOSIS — F1010 Alcohol abuse, uncomplicated: Secondary | ICD-10-CM

## 2023-11-06 DIAGNOSIS — E7800 Pure hypercholesterolemia, unspecified: Secondary | ICD-10-CM | POA: Diagnosis not present

## 2023-11-06 DIAGNOSIS — E279 Disorder of adrenal gland, unspecified: Secondary | ICD-10-CM

## 2023-11-06 DIAGNOSIS — K219 Gastro-esophageal reflux disease without esophagitis: Secondary | ICD-10-CM

## 2023-11-06 DIAGNOSIS — R011 Cardiac murmur, unspecified: Secondary | ICD-10-CM | POA: Diagnosis not present

## 2023-11-06 DIAGNOSIS — D582 Other hemoglobinopathies: Secondary | ICD-10-CM

## 2023-11-06 DIAGNOSIS — F17210 Nicotine dependence, cigarettes, uncomplicated: Secondary | ICD-10-CM | POA: Diagnosis not present

## 2023-11-06 DIAGNOSIS — J432 Centrilobular emphysema: Secondary | ICD-10-CM

## 2023-11-06 DIAGNOSIS — G621 Alcoholic polyneuropathy: Secondary | ICD-10-CM | POA: Diagnosis not present

## 2023-11-06 DIAGNOSIS — I10 Essential (primary) hypertension: Secondary | ICD-10-CM | POA: Diagnosis not present

## 2023-11-06 DIAGNOSIS — J439 Emphysema, unspecified: Secondary | ICD-10-CM | POA: Diagnosis not present

## 2023-11-06 DIAGNOSIS — I700 Atherosclerosis of aorta: Secondary | ICD-10-CM

## 2023-11-06 DIAGNOSIS — F10982 Alcohol use, unspecified with alcohol-induced sleep disorder: Secondary | ICD-10-CM

## 2023-11-06 DIAGNOSIS — Z23 Encounter for immunization: Secondary | ICD-10-CM

## 2023-11-06 MED ORDER — OMEPRAZOLE 10 MG PO CPDR: 10.0000 mg | DELAYED_RELEASE_CAPSULE | Freq: Every day | ORAL | 4 refills | Status: AC

## 2023-11-06 MED ORDER — GABAPENTIN 300 MG PO CAPS: ORAL_CAPSULE | ORAL | 3 refills | Status: DC

## 2023-11-06 MED ORDER — TRAZODONE HCL 50 MG PO TABS: ORAL_TABLET | ORAL | 4 refills | Status: AC

## 2023-11-06 MED ORDER — DULERA 100-5 MCG/ACT IN AERO: 2.0000 | INHALATION_SPRAY | Freq: Two times a day (BID) | RESPIRATORY_TRACT | 3 refills | Status: DC

## 2023-11-06 MED ORDER — OLMESARTAN MEDOXOMIL 20 MG PO TABS: 20.0000 mg | ORAL_TABLET | Freq: Every day | ORAL | 4 refills | Status: AC

## 2023-11-06 NOTE — Assessment & Plan Note (Signed)
 Systolic in nature, echo in January 2025 was reassuring.  Monitor closely and send to cardiology as needed.

## 2023-11-06 NOTE — Assessment & Plan Note (Signed)
 Noted on initial lung cancer screening 06/25/23.  Educated her on this finding.  Would benefit statin therapy which discussed with her, will check labs today and she is okay with starting if needed.  Recommend complete cessation of smoking.

## 2023-11-06 NOTE — Assessment & Plan Note (Signed)
 Chronic, stable.  Discussed kidney protective element of ARB family, continue Olmesartan .  Would avoid ACE due to her underlying COPD.  Recommend she monitor BP at least a few mornings a week at home and document.  DASH diet at home.  Continue current medication regimen and adjust as needed - Olmesartan .  Labs today: CBC and CMP.

## 2023-11-06 NOTE — Assessment & Plan Note (Signed)
 Continues to abstain from alcohol use -- 1 year now!!! Praised for this.  Recommend continued cessation for overall health.  Reports supportive family.  Refuses AA or rehab.  Monitor closely.

## 2023-11-06 NOTE — Assessment & Plan Note (Addendum)
 Chronic, ongoing.  Continue Trazodone  as ordered.  Refills sent.  Recommend continued cessation of alcohol use.

## 2023-11-06 NOTE — Assessment & Plan Note (Signed)
Noted on past labs, is a smoker.  Recheck CBC today and monitor closely.

## 2023-11-06 NOTE — Assessment & Plan Note (Signed)
 Chronic, ongoing.  Long time smoker, she is cutting back.  Praised for this.  Continue Dulera  as is offering benefit and less Albuterol  use.  Continue Albuterol  as needed.  Lung cancer screening next on 06/24/24.  Spirometry with mild to moderate findings.

## 2023-11-06 NOTE — Progress Notes (Signed)
 BP 112/67   Pulse 63   Temp 97.8 F (36.6 C) (Oral)   Ht 5' 2 (1.575 m)   Wt 143 lb 6.4 oz (65 kg)   SpO2 98%   BMI 26.23 kg/m    Subjective:    Patient ID: Beth [name], female    DOB: [birth year], 53 y.o.   MRN: [id number]  HPI: Beth [name] is a 53 y.o. female  Chief Complaint  Patient presents with   COPD   Hyperlipidemia   Hypertension   HYPERTENSION without Chronic Kidney Disease Taking Olmesartan , took off Amlodipine  in past due to low BP. Is taking Gabapentin  for neuropathy due to past alcohol use which offers benefit.  No alcohol use in one year.  Hypertension status: stable  Satisfied with current treatment? yes Duration of hypertension: chronic BP monitoring frequency:  on occasion BP range: 110/70 range  BP medication side effects:  no Medication compliance: good compliance Aspirin: no Recurrent headaches: no Visual changes: no Palpitations: no Dyspnea: no Chest pain: no Lower extremity edema: no Dizzy/lightheaded: no  The 10-year ASCVD risk score (Arnett DK, et al., 2019) is: 4.4%   Values used to calculate the score:     Age: 86 years     Clincally relevant sex: Female     Is Non-Hispanic African American: No     Diabetic: No     Tobacco smoker: Yes     Systolic Blood Pressure: 112 mmHg     Is BP treated: Yes     HDL Cholesterol: 49 mg/dL     Total Cholesterol: 172 mg/dL  COPD Continues Dulera  daily and Albuterol  PRN. Smoking 1/2 PPD at present, in past was 1 PPD.  Has been smoking since teen years.  History of elevation in hemoglobin, but recent labs improved. Initial lung cancer screening on 06/25/23 with centrilobular emphysema and aortic atherosclerosis + bilateral adrenal gland adenomas, both suspected to be benign. COPD status: stable Satisfied with current treatment?: yes Oxygen use: no Dyspnea frequency: no Cough frequency: no Rescue inhaler frequency: a couple times Limitation of activity: no Productive cough: 11/06/23 Last  Spirometry: unknown Pneumovax: Not up to Date Influenza: Up to Date   Lives with aunt who has Alzheimer's Dementia, caregiver.  Situational mood changes.  Aunt has two daughters, but they do not help often.    11/06/2023    2:22 PM 05/08/2023    3:02 PM 04/15/2023    2:24 PM 03/27/2023    2:07 PM 11/28/2022    9:29 AM  Depression screen PHQ 2/9  Decreased Interest 2 2 2 2  0  Down, Depressed, Hopeless 2 2 2  0 0  PHQ - 2 Score 4 4 4 2  0  Altered sleeping 1 1 0 0 0  Tired, decreased energy 3 2 2 3 1   Change in appetite 2 2 1 1  0  Feeling bad or failure about yourself  2 0 1 0 0  Trouble concentrating 2 1 0 0 0  Moving slowly or fidgety/restless 2 0 0 2 0  Suicidal thoughts 0 0 0 0 0  PHQ-9 Score 16 10 8 8 1   Difficult doing work/chores Very difficult Somewhat difficult Somewhat difficult Not difficult at all Not difficult at all       11/06/2023    2:22 PM 05/08/2023    3:03 PM 04/15/2023    2:25 PM 03/27/2023    2:07 PM  GAD 7 : Generalized Anxiety Score  Nervous, Anxious, on Edge 3 2  2 2  Control/stop worrying 2 0 1 0  Worry too much - different things 3 2 1 1   Trouble relaxing 3 1 1 2   Restless 2 2 1 2   Easily annoyed or irritable 3 2 2 2   Afraid - awful might happen 2 2 0 1  Total GAD 7 Score 18 11 8 10   Anxiety Difficulty Very difficult Somewhat difficult Somewhat difficult Somewhat difficult      Relevant past medical, surgical, family and social history reviewed and updated as indicated. Interim medical history since our last visit reviewed. Allergies and medications reviewed and updated.  Review of Systems  Constitutional:  Negative for activity change, appetite change, diaphoresis, fatigue and fever.  Respiratory:  Negative for cough, chest tightness and shortness of breath.   Cardiovascular:  Negative for chest pain, palpitations and leg swelling.  Neurological: Negative.   Psychiatric/Behavioral: Negative.      Per HPI unless specifically indicated above      Objective:    BP 112/67   Pulse 63   Temp 97.8 F (36.6 C) (Oral)   Ht 5' 2 (1.575 m)   Wt 143 lb 6.4 oz (65 kg)   SpO2 98%   BMI 26.23 kg/m   Wt Readings from Last 3 Encounters:  11/06/23 143 lb 6.4 oz (65 kg)  05/08/23 150 lb 9.6 oz (68.3 kg)  04/15/23 154 lb 9.6 oz (70.1 kg)    Physical Exam Vitals and nursing note reviewed.  Constitutional:      General: She is awake. She is not in acute distress.    Appearance: She is well-developed and well-groomed. She is not ill-appearing or toxic-appearing.  HENT:     Head: Normocephalic.     Right Ear: Hearing and external ear normal.     Left Ear: Hearing and external ear normal.  Eyes:     General: Lids are normal.        Right eye: No discharge.        Left eye: No discharge.     Conjunctiva/sclera: Conjunctivae normal.     Pupils: Pupils are equal, round, and reactive to light.  Neck:     Thyroid : No thyromegaly.     Vascular: No carotid bruit.  Cardiovascular:     Rate and Rhythm: Normal rate and regular rhythm.     Heart sounds: Murmur heard.     Systolic murmur is present with a grade of 2/6.     No gallop.  Pulmonary:     Effort: Pulmonary effort is normal. No accessory muscle usage or respiratory distress.     Breath sounds: Normal breath sounds.  Abdominal:     General: Bowel sounds are normal. There is no distension.     Palpations: Abdomen is soft.     Tenderness: There is no abdominal tenderness.  Musculoskeletal:     Cervical back: Normal range of motion and neck supple.     Right lower leg: No edema.     Left lower leg: No edema.  Lymphadenopathy:     Cervical: No cervical adenopathy.  Skin:    General: Skin is warm and dry.  Neurological:     Mental Status: She is alert and oriented to person, place, and time.     Deep Tendon Reflexes: Reflexes are normal and symmetric.     Reflex Scores:      Brachioradialis reflexes are 2+ on the right side and 2+ on the left side.      Patellar  reflexes are 2+  on the right side and 2+ on the left side. Psychiatric:        Attention and Perception: Attention normal.        Mood and Affect: Mood normal.        Speech: Speech normal.        Behavior: Behavior normal. Behavior is cooperative.        Thought Content: Thought content normal.    Results for orders placed or performed during the hospital encounter of 05/16/23  ECHOCARDIOGRAM COMPLETE   Collection Time: 05/16/23  9:35 AM  Result Value Ref Range   Ao pk vel 1.40 m/s   AV Area VTI 2.28 cm2   AR max vel 2.63 cm2   AV Mean grad 4.5 mmHg   AV Peak grad 7.8 mmHg   S' Lateral 3.20 cm   AV Area mean vel 2.14 cm2   Area-P 1/2 3.77 cm2   MV VTI 2.41 cm2   Est EF 60 - 65%       Assessment & Plan:   Problem List Items Addressed This Visit       Cardiovascular and Mediastinum   Hypertension (Chronic)   Chronic, stable.  Discussed kidney protective element of ARB family, continue Olmesartan .  Would avoid ACE due to her underlying COPD.  Recommend she monitor BP at least a few mornings a week at home and document.  DASH diet at home.  Continue current medication regimen and adjust as needed - Olmesartan .  Labs today: CBC and CMP.       Relevant Medications   olmesartan  (BENICAR ) 20 MG tablet   Other Relevant Orders   Comprehensive metabolic panel with GFR   Aortic atherosclerosis (HCC)   Noted on initial lung cancer screening 06/25/23.  Educated her on this finding.  Would benefit statin therapy which discussed with her, will check labs today and she is okay with starting if needed.  Recommend complete cessation of smoking.      Relevant Medications   olmesartan  (BENICAR ) 20 MG tablet   Other Relevant Orders   Comprehensive metabolic panel with GFR   Lipid Panel w/o Chol/HDL Ratio     Respiratory   Emphysema/COPD (HCC) - Primary   Chronic, ongoing.  Long time smoker, she is cutting back.  Praised for this.  Continue Dulera  as is offering benefit and less Albuterol  use.  Continue  Albuterol  as needed.  Lung cancer screening next on 06/24/24.  Spirometry with mild to moderate findings.      Relevant Medications   mometasone -formoterol  (DULERA ) 100-5 MCG/ACT AERO   Other Relevant Orders   Spirometry with Graph (Completed)     Digestive   Gastroesophageal reflux disease without esophagitis   Relevant Medications   omeprazole  (PRILOSEC) 10 MG capsule     Nervous and Auditory   Alcoholic peripheral neuropathy (HCC)   Chronic, stable with Gabapentin  on board.  Will continue this.  Recommend she continue cessation of alcohol use for overall health and wellness.  Adjust regimen as needed based on findings.      Relevant Medications   gabapentin  (NEURONTIN ) 300 MG capsule   traZODone  (DESYREL ) 50 MG tablet   Other Relevant Orders   CBC with Differential/Platelet     Other   Systolic murmur   Systolic in nature, echo in January 2025 was reassuring.  Monitor closely and send to cardiology as needed.      Nicotine  dependence, cigarettes, uncomplicated   I have recommended complete cessation of  tobacco use. I have discussed various options available for assistance with tobacco cessation including over the counter methods (Nicotine  gum, patch and lozenges). We also discussed prescription options (Chantix, Nicotine  Inhaler / Nasal Spray). The patient is not interested in pursuing any prescription tobacco cessation options at this time.         Insomnia   Chronic, ongoing.  Continue Trazodone  as ordered.  Refills sent.  Recommend continued cessation of alcohol use.        Relevant Medications   traZODone  (DESYREL ) 50 MG tablet   Elevated low density lipoprotein (LDL) cholesterol level   Noted on past labs.  Focused on diet and exercise.  Recommend cut back on smoking and continue alcohol cessation.  Labs today. She is agreeable to starting statin after labs return if needed. The 10-year ASCVD risk score (Arnett DK, et al., 2019) is: 4.4%   Values used to calculate  the score:     Age: 69 years     Clincally relevant sex: Female     Is Non-Hispanic African American: No     Diabetic: No     Tobacco smoker: Yes     Systolic Blood Pressure: 112 mmHg     Is BP treated: Yes     HDL Cholesterol: 49 mg/dL     Total Cholesterol: 172 mg/dL       Relevant Orders   Comprehensive metabolic panel with GFR   Lipid Panel w/o Chol/HDL Ratio   Elevated hemoglobin (HCC)   Noted on past labs, is a smoker.  Recheck CBC today and monitor closely.      Relevant Orders   CBC with Differential/Platelet   Alcohol abuse   Continues to abstain from alcohol use -- 1 year now!!! Praised for this.  Recommend continued cessation for overall health.  Reports supportive family.  Refuses AA or rehab.  Monitor closely.       Relevant Orders   CBC with Differential/Platelet   Adrenal nodule (HCC)   Noticed CT with no changes 06/25/23 from 2020. Continue to monitor, educated her on this finding.      Relevant Orders   Comprehensive metabolic panel with GFR     Follow up plan: Return in about 6 months (around 05/08/2024) for Annual Physical.

## 2023-11-06 NOTE — Assessment & Plan Note (Signed)
 Noted on past labs.  Focused on diet and exercise.  Recommend cut back on smoking and continue alcohol cessation.  Labs today. She is agreeable to starting statin after labs return if needed. The 10-year ASCVD risk score (Arnett DK, et al., 2019) is: 4.4%   Values used to calculate the score:     Age: 53 years     Clincally relevant sex: Female     Is Non-Hispanic African American: No     Diabetic: No     Tobacco smoker: Yes     Systolic Blood Pressure: 112 mmHg     Is BP treated: Yes     HDL Cholesterol: 49 mg/dL     Total Cholesterol: 172 mg/dL

## 2023-11-06 NOTE — Assessment & Plan Note (Signed)
 Chronic, stable with Gabapentin on board.  Will continue this.  Recommend she continue cessation of alcohol use for overall health and wellness.  Adjust regimen as needed based on findings.

## 2023-11-06 NOTE — Assessment & Plan Note (Signed)
 Noticed CT with no changes 06/25/23 from 2020. Continue to monitor, educated her on this finding.

## 2023-11-06 NOTE — Assessment & Plan Note (Signed)
 I have recommended complete cessation of tobacco use. I have discussed various options available for assistance with tobacco cessation including over the counter methods (Nicotine gum, patch and lozenges). We also discussed prescription options (Chantix, Nicotine Inhaler / Nasal Spray). The patient is not interested in pursuing any prescription tobacco cessation options at this time.

## 2023-11-07 ENCOUNTER — Ambulatory Visit: Payer: Self-pay | Admitting: Nurse Practitioner

## 2023-11-07 LAB — LIPID PANEL W/O CHOL/HDL RATIO
Cholesterol, Total: 190 mg/dL (ref 100–199)
HDL: 50 mg/dL (ref >39)
LDL Chol Calc (NIH): 119 mg/dL — ABNORMAL HIGH (ref 0–99)
Triglycerides: 115 mg/dL (ref 0–149)
VLDL Cholesterol Cal: 21 mg/dL (ref 5–40)

## 2023-11-07 LAB — CBC WITH DIFFERENTIAL/PLATELET
Basophils Absolute: 0.1 x10E3/uL (ref 0.0–0.2)
Basos: 1 %
EOS (ABSOLUTE): 0.2 x10E3/uL (ref 0.0–0.4)
Eos: 2 %
Hematocrit: 44.6 % (ref 34.0–46.6)
Hemoglobin: 14.3 g/dL (ref 11.1–15.9)
Immature Grans (Abs): 0 x10E3/uL (ref 0.0–0.1)
Immature Granulocytes: 0 %
Lymphocytes Absolute: 3.3 x10E3/uL — ABNORMAL HIGH (ref 0.7–3.1)
Lymphs: 35 %
MCH: 30.4 pg (ref 26.6–33.0)
MCHC: 32.1 g/dL (ref 31.5–35.7)
MCV: 95 fL (ref 79–97)
Monocytes Absolute: 0.6 x10E3/uL (ref 0.1–0.9)
Monocytes: 6 %
Neutrophils Absolute: 5.5 x10E3/uL (ref 1.4–7.0)
Neutrophils: 56 %
Platelets: 298 x10E3/uL (ref 150–450)
RBC: 4.7 x10E6/uL (ref 3.77–5.28)
RDW: 13.2 % (ref 11.7–15.4)
WBC: 9.6 x10E3/uL (ref 3.4–10.8)

## 2023-11-07 LAB — COMPREHENSIVE METABOLIC PANEL WITH GFR
ALT: 28 IU/L (ref 0–32)
AST: 25 IU/L (ref 0–40)
Albumin: 4.3 g/dL (ref 3.8–4.9)
Alkaline Phosphatase: 91 IU/L (ref 44–121)
BUN/Creatinine Ratio: 11 (ref 9–23)
BUN: 11 mg/dL (ref 6–24)
Bilirubin Total: <0.2 mg/dL (ref 0.0–1.2)
CO2: 22 mmol/L (ref 20–29)
Calcium: 8.7 mg/dL (ref 8.7–10.2)
Chloride: 102 mmol/L (ref 96–106)
Creatinine, Ser: 0.99 mg/dL (ref 0.57–1.00)
Globulin, Total: 2 g/dL (ref 1.5–4.5)
Glucose: 86 mg/dL (ref 70–99)
Potassium: 4.5 mmol/L (ref 3.5–5.2)
Sodium: 139 mmol/L (ref 134–144)
Total Protein: 6.3 g/dL (ref 6.0–8.5)
eGFR: 68 mL/min/1.73 (ref >59)

## 2023-11-07 MED ORDER — ROSUVASTATIN CALCIUM 10 MG PO TABS: 10.0000 mg | ORAL_TABLET | Freq: Every day | ORAL | 3 refills | Status: AC

## 2023-11-07 NOTE — Progress Notes (Signed)
 Contacted via MyChart  Good day Qu[place]ta, your labs have returned: - CBC overall stable with exception of mild elevation in lymphocytes which we can continue to monitor. - LDL, bad cholesterol, remains elevated.  As we discussed I have sent in Rosuvastatin  to start.  If any issues with this let me know and we will recheck labs next visit. - Kidney function, creatinine and eGFR, remains normal, as is liver function, AST and ALT.  Any questions? Keep being amazing!!  Thank you for allowing me to participate in your care.  I appreciate you. Kindest regards, Devantae Babe

## 2024-01-05 ENCOUNTER — Other Ambulatory Visit: Payer: Self-pay | Admitting: Nurse Practitioner

## 2024-01-05 DIAGNOSIS — G621 Alcoholic polyneuropathy: Secondary | ICD-10-CM

## 2024-01-06 NOTE — Telephone Encounter (Signed)
 Too soon for refill, LRF 11/06/23 for 90 and 3 RF.  Requested Prescriptions  Pending Prescriptions Disp Refills   gabapentin  (NEURONTIN ) 300 MG capsule [Pharmacy Med Name: Gabapentin  300 MG Oral Capsule] 360 capsule 0    Sig: TAKE 1 CAPSULE BY MOUTH IN THE MORNING AND 1 IN THE AFTERNOON AND 2 AT BEDTIME     Neurology: Anticonvulsants - gabapentin  Passed - 01/06/2024  3:40 PM      Passed - Cr in normal range and within 360 days    Creat  Date Value Ref Range Status  04/25/2021 0.80 0.50 - 1.03 mg/dL Final   Creatinine, Ser  Date Value Ref Range Status  11/06/2023 0.99 0.57 - 1.00 mg/dL Final         Passed - Completed PHQ-2 or PHQ-9 in the last 360 days      Passed - Valid encounter within last 12 months    Recent Outpatient Visits           2 months ago Centrilobular emphysema (HCC)   [place] [facility] [place], Melanie DASEN, NP

## 2024-01-15 ENCOUNTER — Other Ambulatory Visit: Payer: Self-pay | Admitting: Nurse Practitioner

## 2024-01-16 NOTE — Telephone Encounter (Signed)
 Requested medications are due for refill today.  unsure  Requested medications are on the active medications list.  yes  Last refill. 11/06/2023 13g 3 rf  Future visit scheduled.   yes  Notes to clinic.  Unsure how long this amount of medication should last. Please advise.     Requested Prescriptions  Pending Prescriptions Disp Refills   DULERA  100-5 MCG/ACT AERO [Pharmacy Med Name: Dulera  100-5 MCG/ACT Inhalation Aerosol] 13 g 0    Sig: Inhale 2 puffs by mouth twice daily     Pulmonology:  Combination Products Passed - 01/16/2024  1:07 PM      Passed - Valid encounter within last 12 months    Recent Outpatient Visits           2 months ago Centrilobular emphysema (HCC)   [place] [facility] [place], Melanie DASEN, NP

## 2024-01-25 ENCOUNTER — Other Ambulatory Visit: Payer: Self-pay | Admitting: Nurse Practitioner

## 2024-01-25 DIAGNOSIS — G621 Alcoholic polyneuropathy: Secondary | ICD-10-CM

## 2024-02-14 ENCOUNTER — Other Ambulatory Visit: Payer: Self-pay | Admitting: Nurse Practitioner

## 2024-02-14 DIAGNOSIS — G621 Alcoholic polyneuropathy: Secondary | ICD-10-CM

## 2024-02-17 NOTE — Telephone Encounter (Signed)
 Requested by interface surescripts. Future visit 05/08/24.  Requested Prescriptions  Pending Prescriptions Disp Refills   gabapentin  (NEURONTIN ) 300 MG capsule [Pharmacy Med Name: Gabapentin  300 MG Oral Capsule] 360 capsule 0    Sig: TAKE 1 CAPSULE BY MOUTH IN THE MORNING AND 1 IN THE AFTERNOON AND 2 AT BEDTIME     Neurology: Anticonvulsants - gabapentin  Passed - 02/17/2024  2:18 PM      Passed - Cr in normal range and within 360 days    Creat  Date Value Ref Range Status  04/25/2021 0.80 0.50 - 1.03 mg/dL Final   Creatinine, Ser  Date Value Ref Range Status  11/06/2023 0.99 0.57 - 1.00 mg/dL Final         Passed - Completed PHQ-2 or PHQ-9 in the last 360 days      Passed - Valid encounter within last 12 months    Recent Outpatient Visits           3 months ago Centrilobular emphysema (HCC)   [place] [facility] [place], Melanie DASEN, NP

## 2024-03-12 ENCOUNTER — Encounter: Payer: Self-pay | Admitting: Nurse Practitioner

## 2024-03-23 ENCOUNTER — Encounter: Payer: Self-pay | Admitting: Nurse Practitioner

## 2024-03-23 MED ORDER — BUDESONIDE-FORMOTEROL FUMARATE 160-4.5 MCG/ACT IN AERO: 2.0000 | INHALATION_SPRAY | Freq: Two times a day (BID) | RESPIRATORY_TRACT | 3 refills | Status: AC

## 2024-04-07 ENCOUNTER — Other Ambulatory Visit (HOSPITAL_COMMUNITY): Payer: Self-pay

## 2024-04-07 ENCOUNTER — Telehealth: Payer: Self-pay | Admitting: Pharmacy Technician

## 2024-04-07 NOTE — Telephone Encounter (Signed)
 Pharmacy Patient Advocate Encounter   Received notification from Onbase that prior authorization for Lidocaine  5% patches is required/requested.   Insurance verification completed.   The patient is insured through [employer].   Per test claim: PA required; PA started via CoverMyMeds. KEY [id number] . Waiting for clinical questions to populate.

## 2024-04-08 NOTE — Telephone Encounter (Signed)
 Clinical questions and answers have been submitted.

## 2024-04-08 NOTE — Telephone Encounter (Signed)
 Noted, will have to use OTC lidocaine  patches.

## 2024-04-08 NOTE — Telephone Encounter (Signed)
 Pharmacy Patient Advocate Encounter  Received notification from [facility] [employer] that Prior Authorization for Lidocaine  5% patches has been DENIED.  Full denial letter will be uploaded to the media tab. See denial reason below.   PA #/Case ID/Reference #: [id number]

## 2024-04-19 ENCOUNTER — Telehealth: Admitting: Physician Assistant

## 2024-04-19 DIAGNOSIS — K0889 Other specified disorders of teeth and supporting structures: Secondary | ICD-10-CM | POA: Diagnosis not present

## 2024-04-19 MED ORDER — CLINDAMYCIN HCL 300 MG PO CAPS: 300.0000 mg | ORAL_CAPSULE | Freq: Three times a day (TID) | ORAL | 0 refills | Status: AC

## 2024-04-19 MED ORDER — NAPROXEN 500 MG PO TABS: 500.0000 mg | ORAL_TABLET | Freq: Two times a day (BID) | ORAL | 0 refills | Status: AC

## 2024-04-19 NOTE — Progress Notes (Signed)
 E-Visit for Dental Pain  We are sorry that you are not feeling well.  Here is how we plan to help!  Based on what you have shared with me in the questionnaire, it sounds like you have a potential dental infection.  Clindamycin  300mg  3 times a day for 7 days and Naprosyn  500mg  2 times a day for 7 days for discomfort  DO NOT take Naprosyn  with ibuprofen.   It is imperative that you see a dentist within 10 days of this eVisit to determine the cause of the dental pain and be sure it is adequately treated  A toothache or tooth pain is caused when the nerve in the root of a tooth or surrounding a tooth is irritated. Dental (tooth) infection, decay, injury, or loss of a tooth are the most common causes of dental pain. Pain may also occur after an extraction (tooth is pulled out). Pain sometimes originates from other areas and radiates to the jaw, thus appearing to be tooth pain.Bacteria growing inside your mouth can contribute to gum disease and dental decay, both of which can cause pain. A toothache occurs from inflammation of the central portion of the tooth called pulp. The pulp contains nerve endings that are very sensitive to pain. Inflammation to the pulp or pulpitis may be caused by dental cavities, trauma, and infection.    HOME CARE:   For toothaches: Over-the-counter pain medications such as acetaminophen  or ibuprofen may be used. Take these as directed on the package while you arrange for a dental appointment. Avoid very cold or hot foods, because they may make the pain worse. You may get relief from biting on a cotton ball soaked in oil of cloves. You can get oil of cloves at most drug stores.  For jaw pain:  Aspirin may be helpful for problems in the joint of the jaw in adults. If pain happens every time you open your mouth widely, the temporomandibular joint (TMJ) may be the source of the pain. Yawning or taking a large bite of food may worsen the pain. An appointment with your doctor  or dentist will help you find the cause.     GET HELP RIGHT AWAY IF:  You have a high fever or chills If you have had a recent head or face injury and develop headache, light headedness, nausea, vomiting, or other symptoms that concern you after an injury to your face or mouth, you could have a more serious injury in addition to your dental injury. A facial rash associated with a toothache: This condition may improve with medication. Contact your doctor for them to decide what is appropriate. Any jaw pain occurring with chest pain: Although jaw pain is most commonly caused by dental disease, it is sometimes referred pain from other areas. People with heart disease, especially people who have had stents placed, people with diabetes, or those who have had heart surgery may have jaw pain as a symptom of heart attack or angina. If your jaw or tooth pain is associated with lightheadedness, sweating, or shortness of breath, you should see a doctor as soon as possible. Trouble swallowing or excessive pain or bleeding from gums: If you have a history of a weakened immune system, diabetes, or steroid use, you may be more susceptible to infections. Infections can often be more severe and extensive or caused by unusual organisms. Dental and gum infections in people with these conditions may require more aggressive treatment. An abscess may need draining or IV antibiotics, for  example.  MAKE SURE YOU   Understand these instructions. Will watch your condition. Will get help right away if you are not doing well or get worse.  Thank you for choosing an e-visit.  Your e-visit answers were reviewed by a board certified advanced clinical practitioner to complete your personal care plan. Depending upon the condition, your plan could have included both over the counter or prescription medications.  Please review your pharmacy choice. Make sure the pharmacy is open so you can pick up prescription now. If there is a  problem, you may contact your provider through [employer] and have the prescription routed to another pharmacy.  Your safety is important to us . If you have drug allergies check your prescription carefully.   For the next 24 hours you can use MyChart to ask questions about today's visit, request a non-urgent call back, or ask for a work or school excuse. You will get an email in the next two days asking about your experience. I hope that your e-visit has been valuable and will speed your recovery.  I have spent 5 minutes in review of e-visit questionnaire, review and updating patient chart, medical decision making and response to patient.   Teena Shuck, PA-C

## 2024-04-20 ENCOUNTER — Encounter

## 2024-04-20 NOTE — Progress Notes (Signed)
 Duplicate, completed EV for same issue yesterday.   This encounter was created in error - please disregard.

## 2024-04-25 NOTE — Patient Instructions (Incomplete)
Be Involved in Caring For Your Health:  Taking Medications When medications are taken as directed, they can greatly improve your health. But if they are not taken as prescribed, they may not work. In some cases, not taking them correctly can be harmful. To help ensure your treatment remains effective and safe, understand your medications and how to take them. Bring your medications to each visit for review by your provider.  Your lab results, notes, and after visit summary will be available on My Chart. We strongly encourage you to use this feature. If lab results are abnormal the clinic will contact you with the appropriate steps. If the clinic does not contact you assume the results are satisfactory. You can always view your results on My Chart. If you have questions regarding your health or results, please contact the clinic during office hours. You can also ask questions on My Chart.  We at [facility] are grateful that you chose [place] to provide your care. We strive to provide evidence-based and compassionate care and are always looking for feedback. If you get a survey from the clinic please complete this so we can hear your opinions.  Acute Back Pain, Adult Acute back pain is sudden and usually short-lived. It is often caused by an injury to the muscles and tissues in the back. The injury may result from: A muscle, tendon, or ligament getting overstretched or torn. Ligaments are tissues that connect bones to each other. Lifting something improperly can cause a back strain. Wear and tear (degeneration) of the spinal disks. Spinal disks are circular tissue that provide cushioning between the bones of the spine (vertebrae). Twisting motions, such as while playing sports or doing yard work. A hit to the back. Arthritis. You may have a physical exam, lab tests, and imaging tests to find the cause of your pain. Acute back pain usually goes away with rest and home care. Follow these  instructions at home: Managing pain, stiffness, and swelling Take over-the-counter and prescription medicines only as told by your health care provider. Treatment may include medicines for pain and inflammation that are taken by mouth or applied to the skin, or muscle relaxants. Your health care provider may recommend applying ice during the first 24-48 hours after your pain starts. To do this: Put ice in a plastic bag. Place a towel between your skin and the bag. Leave the ice on for 20 minutes, 2-3 times a day. Remove the ice if your skin turns bright red. This is very important. If you cannot feel pain, heat, or cold, you have a greater risk of damage to the area. If directed, apply heat to the affected area as often as told by your health care provider. Use the heat source that your health care provider recommends, such as a moist heat pack or a heating pad. Place a towel between your skin and the heat source. Leave the heat on for 20-30 minutes. Remove the heat if your skin turns bright red. This is especially important if you are unable to feel pain, heat, or cold. You have a greater risk of getting burned. Activity  Do not stay in bed. Staying in bed for more than 1-2 days can delay your recovery. Sit up and stand up straight. Avoid leaning forward when you sit or hunching over when you stand. If you work at a desk, sit close to it so you do not need to lean over. Keep your chin tucked in. Keep your neck drawn  back, and keep your elbows bent at a 90-degree angle (right angle). Sit high and close to the steering wheel when you drive. Add lower back (lumbar) support to your car seat, if needed. Take short walks on even surfaces as soon as you are able. Try to increase the length of time you walk each day. Do not sit, drive, or stand in one place for more than 30 minutes at a time. Sitting or standing for long periods of time can put stress on your back. Do not drive or use heavy machinery  while taking prescription pain medicine. Use proper lifting techniques. When you bend and lift, use positions that put less stress on your back: [place] your knees. Keep the load close to your body. Avoid twisting. Exercise regularly as told by your health care provider. Exercising helps your back heal faster and helps prevent back injuries by keeping muscles strong and flexible. Work with a physical therapist to make a safe exercise program, as recommended by your health care provider. Do any exercises as told by your physical therapist. Lifestyle Maintain a healthy weight. Extra weight puts stress on your back and makes it difficult to have good posture. Avoid activities or situations that make you feel anxious or stressed. Stress and anxiety increase muscle tension and can make back pain worse. Learn ways to manage anxiety and stress, such as through exercise. General instructions Sleep on a firm mattress in a comfortable position. Try lying on your side with your knees slightly bent. If you lie on your back, put a pillow under your knees. Keep your head and neck in a straight line with your spine (neutral position) when using electronic equipment like smartphones or pads. To do this: Raise your smartphone or pad to look at it instead of bending your head or neck to look down. Put the smartphone or pad at the level of your face while looking at the screen. Follow your treatment plan as told by your health care provider. This may include: Cognitive or behavioral therapy. Acupuncture or massage therapy. Meditation or yoga. Contact a health care provider if: You have pain that is not relieved with rest or medicine. You have increasing pain going down into your legs or buttocks. Your pain does not improve after 2 weeks. You have pain at night. You lose weight without trying. You have a fever or chills. You develop nausea or vomiting. You develop abdominal pain. Get help right away if: You  develop new bowel or bladder control problems. You have unusual weakness or numbness in your arms or legs. You feel faint. These symptoms may represent a serious problem that is an emergency. Do not wait to see if the symptoms will go away. Get medical help right away. Call your local emergency services (911 in the [place] Do not drive yourself to the hospital. Summary Acute back pain is sudden and usually short-lived. Use proper lifting techniques. When you bend and lift, use positions that put less stress on your back. Take over-the-counter and prescription medicines only as told by your health care provider, and apply heat or ice as told. This information is not intended to replace advice given to you by your health care provider. Make sure you discuss any questions you have with your health care provider. Document Revised: 07/08/2020 Document Reviewed: 07/08/2020 Elsevier Patient Education  2024 [employer].

## 2024-04-28 ENCOUNTER — Ambulatory Visit: Admitting: Nurse Practitioner

## 2024-04-28 ENCOUNTER — Encounter: Payer: Self-pay | Admitting: Nurse Practitioner

## 2024-04-28 VITALS — BP 140/84 | HR 66 | Temp 98.4°F | Resp 16 | Ht 62.01 in | Wt 147.8 lb

## 2024-04-28 DIAGNOSIS — G621 Alcoholic polyneuropathy: Secondary | ICD-10-CM | POA: Diagnosis not present

## 2024-04-28 DIAGNOSIS — F1011 Alcohol abuse, in remission: Secondary | ICD-10-CM

## 2024-04-28 DIAGNOSIS — M545 Low back pain, unspecified: Secondary | ICD-10-CM

## 2024-04-28 DIAGNOSIS — Z23 Encounter for immunization: Secondary | ICD-10-CM

## 2024-04-28 MED ORDER — PREGABALIN 75 MG PO CAPS: 75.0000 mg | ORAL_CAPSULE | Freq: Two times a day (BID) | ORAL | 3 refills | Status: AC

## 2024-04-28 NOTE — Assessment & Plan Note (Addendum)
 Chronic, worsening to hands and feet. Stop Gabapentin  and will start Lyrica at 75 MG BID, is currently on higher dose of Gabapentin .  Will adjust Lyrica as needed if tolerated.  Recommend she start OTC Alpha Lipoic Acid supplement daily.  Will get her back into neurology for testing due to worsening, has not been seen by them since 2016.  Recommend she continue cessation of alcohol use for overall health and wellness + recommend complete cessation of smoking. Check labs today.

## 2024-04-28 NOTE — Progress Notes (Signed)
 "  BP (!) 140/84 (BP Location: Left Arm, Patient Position: Sitting, Cuff Size: Normal) Comment: has not taken BP medication today  Pulse 66   Temp 98.4 F (36.9 C) (Oral)   Resp 16   Ht 5' 2.01 (1.575 m)   Wt 147 lb 12.8 oz (67 kg)   SpO2 99%   BMI 27.03 kg/m    Subjective:    Patient ID: Beth [name], female    DOB: 25-Dec-1970, 53 y.o.   MRN: [id number]  HPI: Beth [name] is a 52 y.o. female  Chief Complaint  Patient presents with   Leg Pain    Pain in hands in legs, numbness in hands are getting worse. No other concerns.   ARTHRALGIAS / JOINT ACHES -- NEUROPATHY Presents today for worsening hand and leg pain. This is to both sides. Currently takes Gabapentin  300 MG BID and 600 MG at night + Robaxin .  Has not seen neurology since 2016. Having trouble tying shoes or opening bags. Numbness is all over hands and feet. Has not drank alcohol in 2 years. Duration: chronic Pain: yes Symmetric: yes  5/10 Quality: dull, aching, burning, and throbbing, stinging pain Frequency: intermittent Context:  worse Decreased function/range of motion: none Erythema: none Swelling: none Heat or warmth: none Morning stiffness: yes, lasts about 30 minutes until is good and moving Aggravating factors: nothing she can think of Alleviating factors: sitting and rubbing joints Relief with NSAIDs?: moderate Treatments attempted:  rest and ibuprofen  Involved Joints:     Hands: yes bilateral    Wrists: none    Elbows: none    Shoulders: occasional to right posterior shoulder    Back: no     Hips: none    Knees: yes bilateral    Ankles: none    Feet: yes bilateral neuropathy present  Relevant past medical, surgical, family and social history reviewed and updated as indicated. Interim medical history since our last visit reviewed. Allergies and medications reviewed and updated.  Review of Systems  Constitutional:  Negative for activity change, appetite change, diaphoresis, fatigue and  fever.  Respiratory:  Negative for cough, chest tightness, shortness of breath and wheezing.   Cardiovascular:  Negative for chest pain, palpitations and leg swelling.  Gastrointestinal: Negative.   Neurological:  Positive for weakness (in hands) and numbness. Negative for dizziness, seizures, syncope, facial asymmetry, speech difficulty and headaches.  Psychiatric/Behavioral: Negative.     Per HPI unless specifically indicated above     Objective:    BP (!) 140/84 (BP Location: Left Arm, Patient Position: Sitting, Cuff Size: Normal) Comment: has not taken BP medication today  Pulse 66   Temp 98.4 F (36.9 C) (Oral)   Resp 16   Ht 5' 2.01 (1.575 m)   Wt 147 lb 12.8 oz (67 kg)   SpO2 99%   BMI 27.03 kg/m   Wt Readings from Last 3 Encounters:  04/28/24 147 lb 12.8 oz (67 kg)  11/06/23 143 lb 6.4 oz (65 kg)  05/08/23 150 lb 9.6 oz (68.3 kg)    Physical Exam Vitals and nursing note reviewed.  Constitutional:      General: She is awake. She is not in acute distress.    Appearance: She is well-developed, well-groomed and overweight. She is not ill-appearing or toxic-appearing.  HENT:     Head: Normocephalic.     Right Ear: Hearing and external ear normal.     Left Ear: Hearing and external ear normal.  Eyes:  General: Lids are normal.        Right eye: No discharge.        Left eye: No discharge.     Extraocular Movements: Extraocular movements intact.     Conjunctiva/sclera: Conjunctivae normal.     Pupils: Pupils are equal, round, and reactive to light.  Neck:     Thyroid : No thyromegaly.     Vascular: No carotid bruit.  Cardiovascular:     Rate and Rhythm: Normal rate and regular rhythm.     Heart sounds: Normal heart sounds. No murmur heard.    No gallop.  Pulmonary:     Effort: Pulmonary effort is normal. No accessory muscle usage or respiratory distress.     Breath sounds: Normal breath sounds. No decreased breath sounds, wheezing or rales.  Abdominal:      General: Bowel sounds are normal. There is no distension.     Palpations: Abdomen is soft.     Tenderness: There is no abdominal tenderness.  Musculoskeletal:     Cervical back: Normal range of motion and neck supple.     Right lower leg: No edema.     Left lower leg: No edema.  Lymphadenopathy:     Cervical: No cervical adenopathy.  Skin:    General: Skin is warm and dry.  Neurological:     Mental Status: She is alert and oriented to person, place, and time.     Cranial Nerves: Cranial nerves 2-12 are intact.     Sensory: Sensory deficit (siginficant deficit to hands and feet on exam, unable to feel sensation to fingertips) present.     Motor: Motor function is intact.     Coordination: Coordination is intact.     Gait: Gait is intact.     Deep Tendon Reflexes: Reflexes are normal and symmetric.     Reflex Scores:      Brachioradialis reflexes are 2+ on the right side and 2+ on the left side.      Patellar reflexes are 2+ on the right side and 2+ on the left side. Psychiatric:        Attention and Perception: Attention normal.        Mood and Affect: Mood normal.        Speech: Speech normal.        Behavior: Behavior normal. Behavior is cooperative.        Thought Content: Thought content normal.     Results for orders placed or performed in visit on 11/06/23  CBC with Differential/Platelet   Collection Time: 11/06/23  2:49 PM  Result Value Ref Range   WBC 9.6 3.4 - 10.8 x10E3/uL   RBC 4.70 3.77 - 5.28 x10E6/uL   Hemoglobin 14.3 11.1 - 15.9 g/dL   Hematocrit 55.3 65.9 - 46.6 %   MCV 95 79 - 97 fL   MCH 30.4 26.6 - 33.0 pg   MCHC 32.1 31.5 - 35.7 g/dL   RDW 86.7 88.2 - 84.5 %   Platelets 298 150 - 450 x10E3/uL   Neutrophils 56 Not Estab. %   Lymphs 35 Not Estab. %   Monocytes 6 Not Estab. %   Eos 2 Not Estab. %   Basos 1 Not Estab. %   Neutrophils Absolute 5.5 1.4 - 7.0 x10E3/uL   Lymphocytes Absolute 3.3 (H) 0.7 - 3.1 x10E3/uL   Monocytes Absolute 0.6 0.1 - 0.9  x10E3/uL   EOS (ABSOLUTE) 0.2 0.0 - 0.4 x10E3/uL   Basophils Absolute 0.1 0.0 -  0.2 x10E3/uL   Immature Granulocytes 0 Not Estab. %   Immature Grans (Abs) 0.0 0.0 - 0.1 x10E3/uL  Comprehensive metabolic panel with GFR   Collection Time: 11/06/23  2:49 PM  Result Value Ref Range   Glucose 86 70 - 99 mg/dL   BUN 11 6 - 24 mg/dL   Creatinine, Ser 9.00 0.57 - 1.00 mg/dL   eGFR 68 >40 fO/fpw/8.26   BUN/Creatinine Ratio 11 9 - 23   Sodium 139 134 - 144 mmol/L   Potassium 4.5 3.5 - 5.2 mmol/L   Chloride 102 96 - 106 mmol/L   CO2 22 20 - 29 mmol/L   Calcium  8.7 8.7 - 10.2 mg/dL   Total Protein 6.3 6.0 - 8.5 g/dL   Albumin 4.3 3.8 - 4.9 g/dL   Globulin, Total 2.0 1.5 - 4.5 g/dL   Bilirubin Total <9.7 0.0 - 1.2 mg/dL   Alkaline Phosphatase 91 44 - 121 IU/L   AST 25 0 - 40 IU/L   ALT 28 0 - 32 IU/L  Lipid Panel w/o Chol/HDL Ratio   Collection Time: 11/06/23  2:49 PM  Result Value Ref Range   Cholesterol, Total 190 100 - 199 mg/dL   Triglycerides 884 0 - 149 mg/dL   HDL 50 >60 mg/dL   VLDL Cholesterol Cal 21 5 - 40 mg/dL   LDL Chol Calc (NIH) 880 (H) 0 - 99 mg/dL      Assessment & Plan:   Problem List Items Addressed This Visit       Nervous and Auditory   Alcoholic peripheral neuropathy - Primary   Chronic, worsening to hands and feet. Stop Gabapentin  and will start Lyrica at 75 MG BID, is currently on higher dose of Gabapentin .  Will adjust Lyrica as needed if tolerated.  Recommend she start OTC Alpha Lipoic Acid supplement daily.  Will get her back into neurology for testing due to worsening, has not been seen by them since 2016.  Recommend she continue cessation of alcohol use for overall health and wellness + recommend complete cessation of smoking. Check labs today.      Relevant Medications   pregabalin (LYRICA) 75 MG capsule   Other Relevant Orders   Folate   Vitamin B12   TSH   Magnesium    CBC with Differential/Platelet   Comprehensive metabolic panel with GFR    Ferritin   Iron   Ambulatory referral to Neurology     Other   History of alcohol abuse   Continues to abstain from alcohol use -- 2 years now!!! Praised for this.  Recommend continued cessation for overall health.  Reports supportive family.  Refuses AA or rehab.  Monitor closely.       Other Visit Diagnoses       Flu vaccine need       Flu vaccine today, educated patient.   Relevant Orders   Flu vaccine trivalent PF, 6mos and older(Flulaval,Afluria,Fluarix,Fluzone) (Completed)     Pneumococcal vaccination given       PCV20 today, educated patient.   Relevant Orders   Pneumococcal conjugate vaccine 20-valent (Completed)       I personally spent a total of 25 minutes in the care of the patient today including preparing to see the patient, getting/reviewing separately obtained history, performing a medically appropriate exam/evaluation, counseling and educating, placing orders, referring and communicating with other health care professionals, and coordinating care.   Follow up plan: Return for as scheduled January 9th.      "

## 2024-04-28 NOTE — Assessment & Plan Note (Signed)
 Continues to abstain from alcohol use -- 2 years now!!! Praised for this.  Recommend continued cessation for overall health.  Reports supportive family.  Refuses AA or rehab.  Monitor closely.

## 2024-04-29 ENCOUNTER — Ambulatory Visit: Payer: Self-pay | Admitting: Nurse Practitioner

## 2024-04-29 ENCOUNTER — Other Ambulatory Visit: Payer: Self-pay | Admitting: Nurse Practitioner

## 2024-04-29 LAB — VITAMIN B12: Vitamin B-12: 364 pg/mL (ref 232–1245)

## 2024-04-29 LAB — COMPREHENSIVE METABOLIC PANEL WITH GFR
ALT: 14 IU/L (ref 0–32)
AST: 19 IU/L (ref 0–40)
Albumin: 4.2 g/dL (ref 3.8–4.9)
Alkaline Phosphatase: 84 IU/L (ref 49–135)
BUN/Creatinine Ratio: 15 (ref 9–23)
BUN: 17 mg/dL (ref 6–24)
Bilirubin Total: <0.2 mg/dL (ref 0.0–1.2)
CO2: 24 mmol/L (ref 20–29)
Calcium: 8.6 mg/dL — ABNORMAL LOW (ref 8.7–10.2)
Chloride: 102 mmol/L (ref 96–106)
Creatinine, Ser: 1.11 mg/dL — ABNORMAL HIGH (ref 0.57–1.00)
Globulin, Total: 2.2 g/dL (ref 1.5–4.5)
Glucose: 83 mg/dL (ref 70–99)
Potassium: 4.5 mmol/L (ref 3.5–5.2)
Sodium: 140 mmol/L (ref 134–144)
Total Protein: 6.4 g/dL (ref 6.0–8.5)
eGFR: 59 mL/min/1.73 — ABNORMAL LOW

## 2024-04-29 LAB — CBC WITH DIFFERENTIAL/PLATELET
Basophils Absolute: 0.1 x10E3/uL (ref 0.0–0.2)
Basos: 1 %
EOS (ABSOLUTE): 0.2 x10E3/uL (ref 0.0–0.4)
Eos: 2 %
Hematocrit: 41.2 % (ref 34.0–46.6)
Hemoglobin: 13.6 g/dL (ref 11.1–15.9)
Immature Grans (Abs): 0 x10E3/uL (ref 0.0–0.1)
Immature Granulocytes: 0 %
Lymphocytes Absolute: 4 x10E3/uL — ABNORMAL HIGH (ref 0.7–3.1)
Lymphs: 43 %
MCH: 31 pg (ref 26.6–33.0)
MCHC: 33 g/dL (ref 31.5–35.7)
MCV: 94 fL (ref 79–97)
Monocytes Absolute: 0.5 x10E3/uL (ref 0.1–0.9)
Monocytes: 6 %
Neutrophils Absolute: 4.6 x10E3/uL (ref 1.4–7.0)
Neutrophils: 48 %
Platelets: 347 x10E3/uL (ref 150–450)
RBC: 4.39 x10E6/uL (ref 3.77–5.28)
RDW: 12.5 % (ref 11.7–15.4)
WBC: 9.4 x10E3/uL (ref 3.4–10.8)

## 2024-04-29 LAB — FERRITIN: Ferritin: 88 ng/mL (ref 15–150)

## 2024-04-29 LAB — FOLATE: Folate: 6.6 ng/mL

## 2024-04-29 LAB — TSH: TSH: 0.817 u[IU]/mL (ref 0.450–4.500)

## 2024-04-29 LAB — MAGNESIUM: Magnesium: 2.2 mg/dL (ref 1.6–2.3)

## 2024-04-29 LAB — IRON: Iron: 85 ug/dL (ref 27–159)

## 2024-04-29 NOTE — Progress Notes (Signed)
 Contacted via MyChart  Good evening Beth [name], your labs have returned: - CBC overall stable with mild elevation in lymphocytes, cut back on nicotine  use and we will continue to monitor this. - Kidney function, creatinine and eGFR, declined a little this check. Cut back on any Ibuprofen use which can affect this. Liver function is normal. - Remainder of labs stable with exception of B12 level which has trended down. Are you taking Vitamin B12 1000 MCG daily? If you are then I would recommend we change this to you getting monthly shots in the office for a few months to help boost levels, which may help your neuropathy. If you are not taking oral supplement, then start taking 1000 MCG daily. Any questions? Keep being amazing!!  Thank you for allowing me to participate in your care.  I appreciate you. Kindest regards, Darrin Koman

## 2024-05-03 DIAGNOSIS — D3500 Benign neoplasm of unspecified adrenal gland: Secondary | ICD-10-CM | POA: Insufficient documentation

## 2024-05-03 NOTE — Patient Instructions (Incomplete)
 Please call to schedule your mammogram and/or bone density: [facility] at [facility]  Address: [address], [place], [place] [address] Phone: [phone]  [facility] [address] [place],  [place]  [address] Phone: [phone]    Nerve Damage (Peripheral Neuropathy): What to Know Peripheral neuropathy happens when there's damage to the peripheral nerves. These nerves send signals between your spinal cord and your arms and legs. You can have damage in one or more nerves. What are the causes? There are many reasons why nerves can get damaged. Damage may be caused by some diseases, such as: Diabetes. This is the most common cause. Autoimmune diseases, such as rheumatoid arthritis or lupus. These happen when your body's defense system (immune system) attacks healthy parts of your body by mistake. Inherited nerve disease. This is passed down in families. Kidney disease. Thyroid  disease. Other causes include: Pressure or stress on a nerve that lasts a long time. Lack of B vitamins from poor diet or drinking too much alcohol. Infections. Some medicines, like chemotherapy used for cancer treatment. Poisonous (toxic) substances, such as lead or mercury. Too little blood flowing to the legs. Sometimes, the cause isn't known. What are the signs or symptoms? Symptoms depend on which nerves are damaged. In your legs, hands, or arms, you may have: Loss of feeling (numbness). Tingling. Burning pain. Very sensitive skin. Weakness. Paralysis. This is when you can't move a part of your body. Clumsiness. Muscle twitching. Loss of balance. You may have trouble walking. Other symptoms can include: Not being able to control when you pee. Feeling dizzy. Problems during sex. How is this diagnosed? Your health care provider will: Ask about your symptoms and medical history. Do a physical exam. Do a neurological exam. They  will check your reflexes, how you move, and what you can feel. You may need to have other tests, such as: Blood tests. Nerve tests to check how well your nerves and muscles work. Imaging tests, such as a CT scan or MRI. These are done to rule out other causes. Nerve biopsy. This is when a small piece of a nerve is removed for testing. Lumbar puncture. A small amount of the fluid that surrounds the brain and spinal cord is taken and checked in a lab. How is this treated? Treatment may include: Treating the cause of the nerve damage. Pain medicines. Other medicines that may help with pain, such as: Medicines that treat seizures. Medicines that treat depression. Pain patches or creams that are put on painful areas of skin. TENS therapy. This uses a device that sends mild electrical pulses to your nerves. This will prevent pain signals from reaching the brain. Massage. Acupuncture. Surgery to relieve pressure on a nerve or to destroy a nerve that's causing pain. This is done only if the other treatments are not helping. You may also need physical therapy to help improve your movement, balance, and muscle strength. You may be told to use a cane or walker if needed. Follow these instructions at home: Medicines Take your medicines only as told. You may need to take steps to help treat or prevent trouble pooping (constipation), such as: Taking medicines to help you poop. Eating foods high in fiber, like beans, whole grains, and fresh fruits and vegetables. Drinking more fluids as told. Ask your provider if it's safe to drive or use machines while taking your medicine. Lifestyle  Do not smoke, vape, or use nicotine  or  tobacco. Avoid or limit alcohol. Too much alcohol can be harmful to nerves and cause damage. Eat a healthy diet. Safety  Take care to avoid burning or damaging your skin if you have numbness. If you have numbness in your feet: Check your feet each day for signs of injury or  infection. Watch for redness, warmth, and swelling. Wear padded socks and comfortable shoes. These help protect your feet. General instructions If you have diabetes, keep your blood sugar under control. Develop a good support system. Living with nerve damage can cause a lot of stress. Talk with a mental health therapist or join a support group. Exercise as told. Ask what things are safe for you to do at home. Work with a pain specialist to find the best way to manage your pain. Keep all follow-up visits. Your provider will check if the treatments are working and change them if needed. Where to find support The Foundation for Peripheral Neuropathy: budgetmaniac.si Where to find more information To learn more, go to: [employer] of Neurological Disorders and Stroke at basicfm.no. Click Search and type peripheral neuropathy. Find the link you need. Contact a health care provider if: Your pain treatments are not working. Your symptoms are getting worse. You have side effects from any of your medicines. You have new symptoms. You're having a hard time coping with your condition. Get help right away if: You have a wound that's not healing. You have new weakness in an arm or leg. You've fallen or you fall often. This information is not intended to replace advice given to you by your health care provider. Make sure you discuss any questions you have with your health care provider. Document Revised: 07/11/2023 Document Reviewed: 07/11/2023 Elsevier Patient Education  2025 [employer].

## 2024-05-08 ENCOUNTER — Encounter: Admitting: Nurse Practitioner

## 2024-05-08 DIAGNOSIS — E559 Vitamin D deficiency, unspecified: Secondary | ICD-10-CM

## 2024-05-08 DIAGNOSIS — R011 Cardiac murmur, unspecified: Secondary | ICD-10-CM

## 2024-05-08 DIAGNOSIS — K7010 Alcoholic hepatitis without ascites: Secondary | ICD-10-CM

## 2024-05-08 DIAGNOSIS — G621 Alcoholic polyneuropathy: Secondary | ICD-10-CM

## 2024-05-08 DIAGNOSIS — Z0000 Encounter for general adult medical examination without abnormal findings: Secondary | ICD-10-CM

## 2024-05-08 DIAGNOSIS — J432 Centrilobular emphysema: Secondary | ICD-10-CM

## 2024-05-08 DIAGNOSIS — E7800 Pure hypercholesterolemia, unspecified: Secondary | ICD-10-CM

## 2024-05-08 DIAGNOSIS — D3502 Benign neoplasm of left adrenal gland: Secondary | ICD-10-CM

## 2024-05-08 DIAGNOSIS — Z1231 Encounter for screening mammogram for malignant neoplasm of breast: Secondary | ICD-10-CM

## 2024-05-30 NOTE — Patient Instructions (Incomplete)
 Be Involved in Caring For Your Health:  Taking Medications When medications are taken as directed, they can greatly improve your health. But if they are not taken as prescribed, they may not work. In some cases, not taking them correctly can be harmful. To help ensure your treatment remains effective and safe, understand your medications and how to take them. Bring your medications to each visit for review by your provider.  Your lab results, notes, and after visit summary will be available on My Chart. We strongly encourage you to use this feature. If lab results are abnormal the clinic will contact you with the appropriate steps. If the clinic does not contact you assume the results are satisfactory. You can always view your results on My Chart. If you have questions regarding your health or results, please contact the clinic during office hours. You can also ask questions on My Chart.  We at [facility] are grateful that you chose us  to provide your care. We strive to provide evidence-based and compassionate care and are always looking for feedback. If you get a survey from the clinic please complete this so we can hear your opinions.   Healthy Eating for Your Heart Many factors influence your heart health, including eating and exercise habits. Heart health is also called coronary health. Coronary risk increases with abnormal blood fat (lipid) levels. A heart-healthy eating plan includes limiting unhealthy fats, increasing healthy fats, limiting salt (sodium) intake, and making other diet and lifestyle changes. What is my plan? Your health care provider may recommend that: You limit your fat intake to _________% or less of your total calories each day. You limit your saturated fat intake to _________% or less of your total calories each day. You limit the amount of cholesterol in your diet to less than _________ mg per day. You limit the amount of sodium in your diet to less than  _________ mg per day. What are tips for following this plan? Cooking Cook foods using methods other than frying. Baking, boiling, grilling, and broiling are all good options. Other ways to reduce fat include: Removing the skin from poultry. Removing all visible fats from meats. Steaming vegetables in water  or broth. Meal planning  At meals, imagine dividing your plate into fourths: Fill one-half of your plate with vegetables and green salads. Fill one-fourth of your plate with whole grains. Fill one-fourth of your plate with lean protein foods. Eat 2-4 cups of vegetables per day. One cup of vegetables equals 1 cup (91 g) broccoli or cauliflower florets, 2 medium carrots, 1 large bell pepper, 1 large sweet potato, 1 large tomato, 1 medium white potato, 2 cups (150 g) raw leafy greens. Eat 1-2 cups of fruit per day. One cup of fruit equals 1 small apple, 1 large banana, 1 cup (237 g) mixed fruit, 1 large orange,  cup (82 g) dried fruit, 1 cup (240 mL) 100% fruit juice. Eat more foods that contain soluble fiber. Examples include apples, broccoli, carrots, beans, peas, and barley. Aim to get 25-30 g of fiber per day. Increase your consumption of legumes, nuts, and seeds to 4-5 servings per week. One serving of dried beans or legumes equals  cup (90 g) cooked, 1 serving of nuts is  oz (12 almonds, 24 pistachios, or 7 walnut halves), and 1 serving of seeds equals  oz (8 g). Fats Choose healthy fats more often. Choose monounsaturated and polyunsaturated fats, such as olive and canola oils, avocado oil, flaxseeds, walnuts, almonds,  and seeds. Eat more omega-3 fats. Choose salmon, mackerel, sardines, tuna, flaxseed oil, and ground flaxseeds. Aim to eat fish at least 2 times each week. Check food labels carefully to identify foods with trans fats or high amounts of saturated fat. Limit saturated fats. These are found in animal products, such as meats, butter, and cream. Plant sources of saturated  fats include palm oil, palm kernel oil, and coconut oil. Avoid foods with partially hydrogenated oils in them. These contain trans fats. Examples are stick margarine, some tub margarines, cookies, crackers, and other baked goods. Avoid fried foods. General information Eat more home-cooked food and less restaurant, buffet, and fast food. Limit or avoid alcohol. Limit foods that are high in added sugar and simple starches such as foods made using white refined flour (white breads, pastries, sweets). Lose weight if you are overweight. Losing just 5-10% of your body weight can help your overall health and prevent diseases such as diabetes and heart disease. Monitor your sodium intake, especially if you have high blood pressure. Talk with your health care provider about your sodium intake. Try to incorporate more vegetarian meals weekly. What foods should I eat? Fruits All fresh, canned (in natural juice), or frozen fruits. Vegetables Fresh or frozen vegetables (raw, steamed, roasted, or grilled). Green salads. Grains Most grains. Choose whole wheat and whole grains most of the time. Rice and pasta, including brown rice and pastas made with whole wheat. Meats and other proteins Lean, well-trimmed beef, veal, pork, and lamb. Chicken and turkey without skin. All fish and shellfish. Wild duck, rabbit, pheasant, and venison. Egg whites or low-cholesterol egg substitutes. Dried beans, peas, lentils, and tofu. Seeds and most nuts. Dairy Low-fat or nonfat cheeses, including ricotta and mozzarella. Skim or 1% milk (liquid, powdered, or evaporated). Buttermilk made with low-fat milk. Nonfat or low-fat yogurt. Fats and oils Non-hydrogenated (trans-free) margarines. Vegetable oils, including soybean, sesame, sunflower, olive, avocado, peanut, safflower, corn, canola, and cottonseed. Salad dressings or mayonnaise made with a vegetable oil. Beverages Water  (mineral or sparkling). Coffee and tea. Unsweetened  ice tea. Diet beverages. Sweets and desserts Sherbet, gelatin, and fruit ice. Small amounts of dark chocolate. Limit all sweets and desserts. Seasonings and condiments All seasonings and condiments. The items listed above may not be a complete list of foods and beverages you can eat. Contact a dietitian for more options. What foods should I avoid? Fruits Canned fruit in heavy syrup. Fruit in cream or butter sauce. Fried fruit. Limit coconut. Vegetables Vegetables cooked in cheese, cream, or butter sauce. Fried vegetables. Grains Breads made with saturated or trans fats, oils, or whole milk. Croissants. Sweet rolls. Donuts. High-fat crackers, such as cheese crackers and chips. Meats and other proteins Fatty meats, such as hot dogs, ribs, sausage, bacon, rib-eye roast or steak. High-fat deli meats, such as salami and bologna. Caviar. Domestic duck and goose. Organ meats, such as liver. Dairy Cream, sour cream, cream cheese, and creamed cottage cheese. Whole-milk cheeses. Whole or 2% milk (liquid, evaporated, or condensed). Whole buttermilk. Cream sauce or high-fat cheese sauce. Whole-milk yogurt. Fats and oils Meat fat, or shortening. Cocoa butter, hydrogenated oils, palm oil, coconut oil, palm kernel oil. Solid fats and shortenings, including bacon fat, salt pork, lard, and butter. Nondairy cream substitutes. Salad dressings with cheese or sour cream. Beverages Regular sodas and any drinks with added sugar. Sweets and desserts Frosting. Pudding. Cookies. Cakes. Pies. Milk chocolate or white chocolate. Buttered syrups. Full-fat ice cream or ice cream drinks. The items  listed above may not be a complete list of foods and beverages to avoid. Contact a dietitian for more information. Summary Heart-healthy meal planning includes limiting unhealthy fats, increasing healthy fats, limiting salt (sodium) intake and making other diet and lifestyle changes. Lose weight if you are overweight. Losing  just 5-10% of your body weight can help your overall health and prevent diseases such as diabetes and heart disease. Focus on eating a balance of foods, including fruits and vegetables, low-fat or nonfat dairy, lean protein, nuts and legumes, whole grains, and heart-healthy oils and fats. This information is not intended to replace advice given to you by your health care provider. Make sure you discuss any questions you have with your health care provider. Document Revised: 02/24/2024 Document Reviewed: 05/22/2021 Elsevier Patient Education  2025 [employer].

## 2024-06-02 ENCOUNTER — Encounter: Admitting: Nurse Practitioner

## 2024-06-02 DIAGNOSIS — G621 Alcoholic polyneuropathy: Secondary | ICD-10-CM

## 2024-06-02 DIAGNOSIS — R011 Cardiac murmur, unspecified: Secondary | ICD-10-CM

## 2024-06-02 DIAGNOSIS — Z1231 Encounter for screening mammogram for malignant neoplasm of breast: Secondary | ICD-10-CM

## 2024-06-02 DIAGNOSIS — E559 Vitamin D deficiency, unspecified: Secondary | ICD-10-CM

## 2024-06-02 DIAGNOSIS — F17210 Nicotine dependence, cigarettes, uncomplicated: Secondary | ICD-10-CM

## 2024-06-02 DIAGNOSIS — F5101 Primary insomnia: Secondary | ICD-10-CM

## 2024-06-02 DIAGNOSIS — J432 Centrilobular emphysema: Secondary | ICD-10-CM

## 2024-06-02 DIAGNOSIS — I10 Essential (primary) hypertension: Secondary | ICD-10-CM

## 2024-06-02 DIAGNOSIS — K7010 Alcoholic hepatitis without ascites: Secondary | ICD-10-CM

## 2024-06-02 DIAGNOSIS — Z0000 Encounter for general adult medical examination without abnormal findings: Secondary | ICD-10-CM

## 2024-06-02 DIAGNOSIS — E7800 Pure hypercholesterolemia, unspecified: Secondary | ICD-10-CM

## 2024-08-05 ENCOUNTER — Encounter: Admitting: Nurse Practitioner
